# Patient Record
Sex: Male | Born: 1940 | Race: Black or African American | Hispanic: No | Smoking: Never smoker
Health system: Southern US, Community
[De-identification: ages and names within clinical notes are randomized; demographics above are authoritative.]

## PROBLEM LIST (undated history)

## (undated) DIAGNOSIS — M48061 Spinal stenosis, lumbar region without neurogenic claudication: Secondary | ICD-10-CM

## (undated) DIAGNOSIS — F419 Anxiety disorder, unspecified: Secondary | ICD-10-CM

## (undated) DIAGNOSIS — M1990 Unspecified osteoarthritis, unspecified site: Secondary | ICD-10-CM

## (undated) DIAGNOSIS — F32A Depression, unspecified: Secondary | ICD-10-CM

## (undated) DIAGNOSIS — M545 Low back pain, unspecified: Secondary | ICD-10-CM

## (undated) DIAGNOSIS — C61 Malignant neoplasm of prostate: Secondary | ICD-10-CM

## (undated) DIAGNOSIS — Z87828 Personal history of other (healed) physical injury and trauma: Secondary | ICD-10-CM

## (undated) DIAGNOSIS — M5410 Radiculopathy, site unspecified: Secondary | ICD-10-CM

## (undated) DIAGNOSIS — M47896 Other spondylosis, lumbar region: Secondary | ICD-10-CM

## (undated) DIAGNOSIS — G459 Transient cerebral ischemic attack, unspecified: Secondary | ICD-10-CM

## (undated) DIAGNOSIS — R202 Paresthesia of skin: Secondary | ICD-10-CM

## (undated) HISTORY — PX: PROSTATE BIOPSY: SHX241

## (undated) HISTORY — DX: Paresthesia of skin: R20.2

## (undated) HISTORY — DX: Low back pain, unspecified: M54.50

## (undated) HISTORY — DX: Radiculopathy, site unspecified: M54.10

## (undated) HISTORY — DX: Spinal stenosis, lumbar region without neurogenic claudication: M48.061

## (undated) HISTORY — PX: TONSILLECTOMY: SUR1361

## (undated) HISTORY — DX: Other spondylosis, lumbar region: M47.896

## (undated) HISTORY — DX: Personal history of other (healed) physical injury and trauma: Z87.828

## (undated) SURGICAL SUPPLY — 1 items: HIP BALL ARTICU EZE 36 8.5 (Hips) ×1 IMPLANT

---

## 1999-12-13 ENCOUNTER — Ambulatory Visit (HOSPITAL_COMMUNITY): Admission: RE | Admit: 1999-12-13 | Discharge: 1999-12-13 | Payer: Self-pay | Admitting: Ophthalmology

## 1999-12-13 ENCOUNTER — Encounter: Payer: Self-pay | Admitting: Ophthalmology

## 2015-04-05 DIAGNOSIS — R972 Elevated prostate specific antigen [PSA]: Secondary | ICD-10-CM | POA: Diagnosis not present

## 2015-04-05 DIAGNOSIS — Z0000 Encounter for general adult medical examination without abnormal findings: Secondary | ICD-10-CM | POA: Diagnosis not present

## 2015-08-10 DIAGNOSIS — E785 Hyperlipidemia, unspecified: Secondary | ICD-10-CM | POA: Diagnosis not present

## 2015-08-15 DIAGNOSIS — M545 Low back pain: Secondary | ICD-10-CM | POA: Diagnosis not present

## 2015-08-15 DIAGNOSIS — Z0000 Encounter for general adult medical examination without abnormal findings: Secondary | ICD-10-CM | POA: Diagnosis not present

## 2015-08-15 DIAGNOSIS — G8929 Other chronic pain: Secondary | ICD-10-CM | POA: Diagnosis not present

## 2015-08-15 DIAGNOSIS — M791 Myalgia: Secondary | ICD-10-CM | POA: Diagnosis not present

## 2015-08-21 DIAGNOSIS — M9903 Segmental and somatic dysfunction of lumbar region: Secondary | ICD-10-CM | POA: Diagnosis not present

## 2015-08-21 DIAGNOSIS — M9905 Segmental and somatic dysfunction of pelvic region: Secondary | ICD-10-CM | POA: Diagnosis not present

## 2015-08-21 DIAGNOSIS — M5136 Other intervertebral disc degeneration, lumbar region: Secondary | ICD-10-CM | POA: Diagnosis not present

## 2015-08-21 DIAGNOSIS — M9904 Segmental and somatic dysfunction of sacral region: Secondary | ICD-10-CM | POA: Diagnosis not present

## 2015-10-02 DIAGNOSIS — R972 Elevated prostate specific antigen [PSA]: Secondary | ICD-10-CM | POA: Diagnosis not present

## 2015-11-22 DIAGNOSIS — R972 Elevated prostate specific antigen [PSA]: Secondary | ICD-10-CM | POA: Diagnosis not present

## 2015-11-22 DIAGNOSIS — D075 Carcinoma in situ of prostate: Secondary | ICD-10-CM | POA: Diagnosis not present

## 2015-12-11 DIAGNOSIS — C61 Malignant neoplasm of prostate: Secondary | ICD-10-CM | POA: Diagnosis not present

## 2015-12-18 ENCOUNTER — Encounter: Payer: Self-pay | Admitting: Radiation Oncology

## 2015-12-18 ENCOUNTER — Ambulatory Visit
Admission: RE | Admit: 2015-12-18 | Discharge: 2015-12-18 | Disposition: A | Discharge status: Home / Self Care | Payer: Medicare Other | Source: Ambulatory Visit | Attending: Radiation Oncology | Admitting: Radiation Oncology

## 2015-12-18 VITALS — BP 117/74 | HR 71 | Resp 16 | Ht 71.0 in | Wt 210.0 lb

## 2015-12-18 DIAGNOSIS — C61 Malignant neoplasm of prostate: Secondary | ICD-10-CM | POA: Insufficient documentation

## 2015-12-18 DIAGNOSIS — Z510 Encounter for antineoplastic radiation therapy: Secondary | ICD-10-CM | POA: Insufficient documentation

## 2015-12-18 DIAGNOSIS — R972 Elevated prostate specific antigen [PSA]: Secondary | ICD-10-CM | POA: Diagnosis not present

## 2015-12-18 HISTORY — DX: Malignant neoplasm of prostate: C61

## 2015-12-18 NOTE — Progress Notes (Signed)
Radiation Oncology         [phone]) [phone] ________________________________  Initial Outpatient Consultation  Name: Scott Schneider MRN: [id number]  Date: 12/18/2015  DOB: 06/07/1940  CC:No primary care provider on file.  Franchot Gallo, MD   REFERRING PHYSICIAN: Franchot Gallo, MD  DIAGNOSIS: 75 y.o. gentleman with stage T1c adenocarcinoma of the prostate with a Gleason's score of 4+3 and a PSA of 7.42    ICD-9-CM ICD-10-CM   1. Malignant neoplasm of prostate ([place]) Scott Schneider is a 75 y.o. gentleman.  He was noted to have an elevated PSA of 7.42 by his primary care physician, Dr. Maudie Mercury.  Accordingly, he was referred for evaluation in urology by Dr. Diona Fanti.  The patient was treated for BPH and PSA repeated in 6 months.  The patient proceeded to transrectal ultrasound with 12 biopsies of the prostate on 11/22/2015.  The prostate volume measured 39.12 cc.  Out of 12 core biopsies,3 were positive.  The maximum Gleason score was 7 (4+3), and this was seen in right mid lateral.  The patient reviewed the biopsy results with his urologist and he has kindly been referred today for discussion of potential radiation treatment options.    PREVIOUS RADIATION THERAPY: No  PAST MEDICAL HISTORY:  has a past medical history of Prostate cancer ([place]).    PAST SURGICAL HISTORY: Past Surgical History:  Procedure Laterality Date  . PROSTATE BIOPSY    . TONSILLECTOMY      FAMILY HISTORY: family history includes Cancer in his maternal grandmother, mother, and son.  SOCIAL HISTORY:  reports that he has never smoked. He has never used smokeless tobacco. He reports that he does not drink alcohol or use drugs.  ALLERGIES: Review of patient's allergies indicates no known allergies.  MEDICATIONS:  Current Outpatient Prescriptions  Medication Sig Dispense Refill  . acetaminophen (TYLENOL) 325 MG tablet Take 650 mg by mouth every 6 (six) hours as needed.      . Multiple Vitamins-Minerals (MULTIVITAMIN ADULT PO) Take by mouth.     No current facility-administered medications for this encounter.     REVIEW OF SYSTEMS:  A 15 point review of systems is documented in the electronic medical record. This was obtained by the nursing staff. However, I reviewed this with the patient to discuss relevant findings and make appropriate changes.  Pertinent items are noted in HPI..  The patient completed an IPSS and IIEF questionnaire.  His IPSS score was 12indicating moderate urinary outflow obstructive symptoms.  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  Patient is positive for incomplete emptying, frequency, intermittency, urgency, weak stream, nocturia x 2. He also reports chronic back pain that he manages with Tylenol and ice. Patient denies weight changes, nausea/ vomiting, dysuria, hematuria, leakage, or incontinence.   PHYSICAL EXAM: This patient is in no acute distress.  He is alert and oriented.   height is 5\' 11"  (1.803 m) and weight is 210 lb (95.3 kg). His blood pressure is 117/74 and his pulse is 71. His respiration is 16 and oxygen saturation is 100%.  He exhibits no respiratory distress or labored breathing.  He appears neurologically intact.  His mood is pleasant.  His affect is appropriate.  Please note the digital rectal exam findings described above.  KPS = 100  100 - Normal; no complaints; no evidence of disease. 90   - Able to carry  on normal activity; minor signs or symptoms of disease. 80   - Normal activity with effort; some signs or symptoms of disease. 69   - Cares for self; unable to carry on normal activity or to do active work. 60   - Requires occasional assistance, but is able to care for most of his personal needs. 50   - Requires considerable assistance and frequent medical care. 52   - Disabled; requires special care and assistance. 45   - Severely disabled; hospital  admission is indicated although death not imminent. 35   - Very sick; hospital admission necessary; active supportive treatment necessary. 10   - Moribund; fatal processes progressing rapidly. 0     - Dead  Karnofsky DA, Abelmann WH, Craver LS and Burchenal JH [phone]) The use of the nitrogen mustards in the palliative treatment of carcinoma: with particular reference to bronchogenic carcinoma Cancer 1 [phone]   LABORATORY DATA:  No results found for: WBC, HGB, HCT, MCV, PLT No results found for: NA, K, CL, CO2 No results found for: ALT, AST, GGT, ALKPHOS, BILITOT   RADIOGRAPHY: No results found.    IMPRESSION: This gentleman is a 75 yo with stage T1c adenocarcinoma of the prostate with a Gleason's score of 4+3 and a PSA of 7.42. His T-Stage, Gleason's Score, and PSA put him into the intermediate risk group.  Accordingly he is eligible for a variety of potential treatment options including prostatectomy, external radiation, radioactive seed implant, or hormone therapy.  PLAN: Today I reviewed the findings and workup thus far.  We discussed the natural history of prostate cancer.  We reviewed the the implications of T-stage, Gleason's Score, and PSA on decision-making and outcomes in prostate cancer.  We discussed radiation treatment in the management of prostate cancer with regard to the logistics and delivery of external beam radiation treatment as well as the logistics and delivery of prostate brachytherapy.  We compared and contrasted each of these approaches and also compared these against prostatectomy.  The patient expressed interest in external beam radiotherapy followed by seed implant boost.  We also discussed ADT which is an option in this setting for 6 months with radiation.  We discussed the pros and cons of ADT.  The patient would prefer to avoid ADT, which is reasonable in the setting of EBRT with seed boost given the small volume of 4+3 disease.  The patient would like to proceed  with external radiation followed by radioactive seed implant.  I will share my findings with Dr. Diona Fanti and move forward with scheduling placement of three gold fiducial markers into the prostate to proceed with treatment in the near future.     I enjoyed meeting with him today, and will look forward to participating in the care of this very nice gentleman.   I spent 60 minutes face to face with the patient and more than 50% of that time was spent in counseling and/or coordination of care.   ------------------------------------------------  Sheral Apley. Tammi Klippel, M.D.   This document serves as a record of services personally performed by [name], MD. It was created on his behalf by Bethann Humble, a trained medical scribe. The creation of this record is based on the scribe's personal observations and the provider's statements to them. This document has been checked and approved by the attending provider.

## 2015-12-18 NOTE — Progress Notes (Signed)
GU Location of Tumor / Histology: prostatic adenocarcinoma  If Prostate Cancer, Gleason Score is (4 + 3) and PSA is (7.42) in July 2017  Rondall Allegra presented to PCP for annual physical and was found to have an elevated PSA of 1 then, at recheck was 4. PCP referred patient to Dr. Diona Fanti.   Biopsies of prostate (if applicable) revealed:    Past/Anticipated interventions by urology, if any: prostate biopsy and referral to Dr. Tammi Klippel  Past/Anticipated interventions by medical oncology, if any: no  Weight changes, if any: no  Bowel/Bladder complaints, if any: IPSS 12 with incomplete emptying, frequency, intermittency, urgency, weak stream and nocturia x 2. Denies dysuria, hematuria, leakage or incontinence.   Nausea/Vomiting, if any: no  Pain issues, if any:  Reports chronic back pain which he manages with tylenol and ice  SAFETY ISSUES:  Prior radiation? no  Pacemaker/ICD? no  Possible current pregnancy? no  Is the patient on methotrexate? no  Current Complaints / other details:  75 year old male. Married. Reports occasional night sweats. Reports he has a low tolerance for pain/"sensitive nerves."

## 2015-12-18 NOTE — Progress Notes (Signed)
See progress note under physician encounter. 

## 2015-12-22 ENCOUNTER — Telehealth: Payer: Self-pay | Admitting: *Deleted

## 2015-12-22 NOTE — Telephone Encounter (Signed)
CALLED PATIENT TO INFORM OF APPT. FOR GOLD SEED PLACEMENT ON 12-28-15- ARRIVAL TIME - 8 AM @ DR. DAHLSTEDT'S OFFICE AND HIS SIM ON 01-04-16 @ 1 PM @ DR. MANNING'S OFFICE, SPOKE WITH PATIENT AND HE IS AWARE OF THESE APPTS.

## 2015-12-28 DIAGNOSIS — C61 Malignant neoplasm of prostate: Secondary | ICD-10-CM | POA: Diagnosis not present

## 2015-12-29 NOTE — Progress Notes (Signed)
  Radiation Oncology         [phone] ________________________________  Name: Scott Schneider MRN: [id number]  Date: 01/04/2016  DOB: 1940-04-08  SIMULATION AND TREATMENT PLANNING NOTE    ICD-9-CM ICD-10-CM   1. Malignant neoplasm of prostate ([place]) 185 C61     DIAGNOSIS:  75 y.o. gentleman with stage T1c adenocarcinoma of the prostate with a Gleason's score of 4+3 and a PSA of 7.42  NARRATIVE:  The patient was brought to the [facility].  Identity was confirmed.  All relevant records and images related to the planned course of therapy were reviewed.  The patient freely provided informed written consent to proceed with treatment after reviewing the details related to the planned course of therapy. The consent form was witnessed and verified by the simulation staff.  Then, the patient was set-up in a stable reproducible supine position for radiation therapy.  A vacuum lock pillow device was custom fabricated to position his legs in a reproducible immobilized position.  Then, I performed a urethrogram under sterile conditions to identify the prostatic apex.  CT images were obtained.  Surface markings were placed.  The CT images were loaded into the planning software.  Then the prostate target and avoidance structures including the rectum, bladder, bowel and hips were contoured.  Treatment planning then occurred.  The radiation prescription was entered and confirmed.  A total of 1 complex treatment device was fabricated. I have requested : Intensity Modulated Radiotherapy (IMRT) is medically necessary for this case for the following reason:  Rectal sparing.Marland Kitchen  PLAN:  The patient will receive 78 Gy in 40 fractions. ________________________________  Sheral Apley Tammi Klippel, M.D.

## 2016-01-04 ENCOUNTER — Encounter: Payer: Self-pay | Admitting: Medical Oncology

## 2016-01-04 ENCOUNTER — Ambulatory Visit
Admission: RE | Admit: 2016-01-04 | Discharge: 2016-01-04 | Disposition: A | Discharge status: Home / Self Care | Payer: Medicare Other | Source: Ambulatory Visit | Attending: Radiation Oncology | Admitting: Radiation Oncology

## 2016-01-04 DIAGNOSIS — C61 Malignant neoplasm of prostate: Secondary | ICD-10-CM

## 2016-01-04 DIAGNOSIS — Z510 Encounter for antineoplastic radiation therapy: Secondary | ICD-10-CM | POA: Diagnosis not present

## 2016-01-22 DIAGNOSIS — C61 Malignant neoplasm of prostate: Secondary | ICD-10-CM | POA: Diagnosis not present

## 2016-01-22 DIAGNOSIS — Z510 Encounter for antineoplastic radiation therapy: Secondary | ICD-10-CM | POA: Diagnosis not present

## 2016-01-25 ENCOUNTER — Inpatient Hospital Stay: Admission: RE | Admit: 2016-01-25 | Payer: Medicare Other | Source: Ambulatory Visit | Admitting: Radiation Oncology

## 2016-01-25 ENCOUNTER — Ambulatory Visit
Admission: RE | Admit: 2016-01-25 | Discharge: 2016-01-25 | Disposition: A | Discharge status: Home / Self Care | Payer: Medicare Other | Source: Ambulatory Visit | Attending: Radiation Oncology | Admitting: Radiation Oncology

## 2016-01-25 DIAGNOSIS — C61 Malignant neoplasm of prostate: Secondary | ICD-10-CM | POA: Diagnosis not present

## 2016-01-25 DIAGNOSIS — Z510 Encounter for antineoplastic radiation therapy: Secondary | ICD-10-CM | POA: Diagnosis not present

## 2016-01-26 ENCOUNTER — Ambulatory Visit: Payer: Medicare Other | Admitting: Radiation Oncology

## 2016-01-26 ENCOUNTER — Ambulatory Visit
Admission: RE | Admit: 2016-01-26 | Discharge: 2016-01-26 | Disposition: A | Discharge status: Home / Self Care | Payer: Medicare Other | Source: Ambulatory Visit | Attending: Radiation Oncology | Admitting: Radiation Oncology

## 2016-01-26 DIAGNOSIS — C61 Malignant neoplasm of prostate: Secondary | ICD-10-CM | POA: Diagnosis not present

## 2016-01-26 DIAGNOSIS — Z510 Encounter for antineoplastic radiation therapy: Secondary | ICD-10-CM | POA: Diagnosis not present

## 2016-01-28 ENCOUNTER — Encounter: Payer: Self-pay | Admitting: Radiation Oncology

## 2016-01-28 ENCOUNTER — Ambulatory Visit
Admission: RE | Admit: 2016-01-28 | Discharge: 2016-01-28 | Disposition: A | Discharge status: Home / Self Care | Payer: Medicare Other | Source: Ambulatory Visit | Attending: Radiation Oncology | Admitting: Radiation Oncology

## 2016-01-28 ENCOUNTER — Inpatient Hospital Stay
Admission: RE | Admit: 2016-01-28 | Discharge: 2016-01-28 | Disposition: A | Discharge status: Home / Self Care | Payer: Medicare Other | Source: Ambulatory Visit | Attending: Radiation Oncology | Admitting: Radiation Oncology

## 2016-01-28 VITALS — BP 123/89 | HR 90 | Temp 98.1°F | Resp 16 | Wt 208.4 lb

## 2016-01-28 DIAGNOSIS — C61 Malignant neoplasm of prostate: Secondary | ICD-10-CM | POA: Diagnosis not present

## 2016-01-28 DIAGNOSIS — Z510 Encounter for antineoplastic radiation therapy: Secondary | ICD-10-CM | POA: Diagnosis not present

## 2016-01-28 NOTE — Progress Notes (Signed)
  Radiation Oncology         [phone]   Name: Scott Schneider MRN: [id number]   Date: 01/28/2016  DOB: 12/15/1940   Weekly Radiation Therapy Management    ICD-9-CM ICD-10-CM   1. Malignant neoplasm of prostate ([facility]) 185 C61     Current Dose: 5.85 Gy  Planned Dose:  78 Gy  Narrative The patient presents for routine under treatment assessment.  Weekly rad tx  prostate 3/40 completed. The patient reports a good stream, denies hematuria or dysuria, and pain. He reports nocturia x2 and regular bowels.  Set-up films were reviewed. The chart was checked.  Physical Findings  weight is 208 lb 6.4 oz (94.5 kg). His oral temperature is 98.1 F (36.7 C). His blood pressure is 123/89 and his pulse is 90. His respiration is 16. . Weight essentially stable.  No significant changes.  Impression The patient is tolerating radiation.  Plan Continue treatment as planned.         Sheral Apley Tammi Klippel, M.D.  This document serves as a record of services personally performed by Scott Pita, MD. It was created on his behalf by Darcus Austin, a trained medical scribe. The creation of this record is based on the scribe's personal observations and the provider's statements to them. This document has been checked and approved by the attending provider.

## 2016-01-28 NOTE — Progress Notes (Addendum)
Weekly rad tx  prostate 3/40 completed, patient education done,  Radiation therapy and you book, Sam RN business card,  Discussed sie effects, ways to manage them, :fatigue, skin irritation, dysuria  Urgency frequency, hematuria,  Nocturia, come with full bladder,   No skin products on area being treated,  No issues today, good stream, no hematuria or dysuria, nocturia x2, regular bowels, no pain, verbal understanding ,teach back given BP 123/89 (BP Location: Left Arm, Patient Position: Sitting, Cuff Size: Normal)   Pulse 90   Temp 98.1 F (36.7 C) (Oral)   Resp 16   Wt 208 lb 6.4 oz (94.5 kg)   BMI 29.07 kg/m   Wt Readings from Last 3 Encounters:  01/28/16 208 lb 6.4 oz (94.5 kg)  12/18/15 210 lb (95.3 kg)

## 2016-01-29 ENCOUNTER — Ambulatory Visit
Admission: RE | Admit: 2016-01-29 | Discharge: 2016-01-29 | Disposition: A | Discharge status: Home / Self Care | Payer: Medicare Other | Source: Ambulatory Visit | Attending: Radiation Oncology | Admitting: Radiation Oncology

## 2016-01-29 DIAGNOSIS — C61 Malignant neoplasm of prostate: Secondary | ICD-10-CM | POA: Diagnosis not present

## 2016-01-29 DIAGNOSIS — Z510 Encounter for antineoplastic radiation therapy: Secondary | ICD-10-CM | POA: Diagnosis not present

## 2016-01-30 ENCOUNTER — Ambulatory Visit
Admission: RE | Admit: 2016-01-30 | Discharge: 2016-01-30 | Disposition: A | Discharge status: Home / Self Care | Payer: Medicare Other | Source: Ambulatory Visit | Attending: Radiation Oncology | Admitting: Radiation Oncology

## 2016-01-30 DIAGNOSIS — C61 Malignant neoplasm of prostate: Secondary | ICD-10-CM | POA: Diagnosis not present

## 2016-01-30 DIAGNOSIS — Z510 Encounter for antineoplastic radiation therapy: Secondary | ICD-10-CM | POA: Diagnosis not present

## 2016-01-31 ENCOUNTER — Ambulatory Visit
Admission: RE | Admit: 2016-01-31 | Discharge: 2016-01-31 | Disposition: A | Discharge status: Home / Self Care | Payer: Medicare Other | Source: Ambulatory Visit | Attending: Radiation Oncology | Admitting: Radiation Oncology

## 2016-01-31 DIAGNOSIS — C61 Malignant neoplasm of prostate: Secondary | ICD-10-CM | POA: Diagnosis not present

## 2016-01-31 DIAGNOSIS — Z510 Encounter for antineoplastic radiation therapy: Secondary | ICD-10-CM | POA: Diagnosis not present

## 2016-02-02 ENCOUNTER — Ambulatory Visit: Payer: Medicare Other

## 2016-02-05 ENCOUNTER — Ambulatory Visit
Admission: RE | Admit: 2016-02-05 | Discharge: 2016-02-05 | Disposition: A | Discharge status: Home / Self Care | Payer: Medicare Other | Source: Ambulatory Visit | Attending: Radiation Oncology | Admitting: Radiation Oncology

## 2016-02-05 DIAGNOSIS — C61 Malignant neoplasm of prostate: Secondary | ICD-10-CM | POA: Diagnosis not present

## 2016-02-05 DIAGNOSIS — Z510 Encounter for antineoplastic radiation therapy: Secondary | ICD-10-CM | POA: Diagnosis not present

## 2016-02-06 ENCOUNTER — Ambulatory Visit
Admission: RE | Admit: 2016-02-06 | Discharge: 2016-02-06 | Disposition: A | Discharge status: Home / Self Care | Payer: Medicare Other | Source: Ambulatory Visit | Attending: Radiation Oncology | Admitting: Radiation Oncology

## 2016-02-06 DIAGNOSIS — C61 Malignant neoplasm of prostate: Secondary | ICD-10-CM | POA: Diagnosis not present

## 2016-02-06 DIAGNOSIS — Z510 Encounter for antineoplastic radiation therapy: Secondary | ICD-10-CM | POA: Diagnosis not present

## 2016-02-07 ENCOUNTER — Ambulatory Visit
Admission: RE | Admit: 2016-02-07 | Discharge: 2016-02-07 | Disposition: A | Discharge status: Home / Self Care | Payer: Medicare Other | Source: Ambulatory Visit | Attending: Radiation Oncology | Admitting: Radiation Oncology

## 2016-02-07 DIAGNOSIS — C61 Malignant neoplasm of prostate: Secondary | ICD-10-CM | POA: Diagnosis not present

## 2016-02-07 DIAGNOSIS — Z510 Encounter for antineoplastic radiation therapy: Secondary | ICD-10-CM | POA: Diagnosis not present

## 2016-02-08 ENCOUNTER — Ambulatory Visit
Admission: RE | Admit: 2016-02-08 | Discharge: 2016-02-08 | Disposition: A | Discharge status: Home / Self Care | Payer: Medicare Other | Source: Ambulatory Visit | Attending: Radiation Oncology | Admitting: Radiation Oncology

## 2016-02-08 VITALS — BP 124/79 | HR 77 | Resp 16 | Wt 208.2 lb

## 2016-02-08 DIAGNOSIS — C61 Malignant neoplasm of prostate: Secondary | ICD-10-CM | POA: Diagnosis not present

## 2016-02-08 DIAGNOSIS — Z510 Encounter for antineoplastic radiation therapy: Secondary | ICD-10-CM | POA: Diagnosis not present

## 2016-02-08 NOTE — Progress Notes (Signed)
Weight and vitals stable. Denies pain. Reports nocturia x 2. Denies dysuria or hematuria. Describes a strong steady urine stream without difficulty emptying his bladder. Denies leakage or incontinence. Reports the consistency of his stool is softer but, denies diarrhea. Denies fatigue. Reports increased thirst.  BP 124/79 (BP Location: Left Arm, Patient Position: Sitting, Cuff Size: Normal)   Pulse 77   Resp 16   Wt 208 lb 3.2 oz (94.4 kg)   SpO2 100%   BMI 29.04 kg/m  Wt Readings from Last 3 Encounters:  02/08/16 208 lb 3.2 oz (94.4 kg)  01/28/16 208 lb 6.4 oz (94.5 kg)  12/18/15 210 lb (95.3 kg)

## 2016-02-08 NOTE — Progress Notes (Signed)
  Radiation Oncology         [phone]   Name: Scott Schneider MRN: [id number]   Date: 02/08/2016  DOB: 18-Jun-1940   Weekly Radiation Therapy Management    ICD-9-CM ICD-10-CM   1. Malignant neoplasm of prostate ([facility]) 185 C61     Current Dose: 19.5 Gy  Planned Dose:  78 Gy  Narrative The patient presents for routine under treatment assessment.  Weight and vitals stable. Denies pain. Reports nocturia x2. Denies dysuria or hematuria. Describes a strong steady urine stream without difficulty emptying his bladder. Denies leakage or incontinence. Reports the consistency of his stool is softer but, denies diarrhea. Denies fatigue. Reports increased thirst.   Set-up films were reviewed. The chart was checked.  Physical Findings  weight is 208 lb 3.2 oz (94.4 kg). His blood pressure is 124/79 and his pulse is 77. His respiration is 16 and oxygen saturation is 100%. . Weight essentially stable.  No significant changes. Alert, in no acute distress.  Impression The patient is tolerating radiation.  Plan Continue treatment as planned.         Sheral Apley Tammi Klippel, M.D.  This document serves as a record of services personally performed by [name], MD. It was created on his behalf by Arlyce Harman, a trained medical scribe. The creation of this record is based on the scribe's personal observations and the provider's statements to them. This document has been checked and approved by the attending provider.

## 2016-02-09 ENCOUNTER — Ambulatory Visit
Admission: RE | Admit: 2016-02-09 | Discharge: 2016-02-09 | Disposition: A | Discharge status: Home / Self Care | Payer: Medicare Other | Source: Ambulatory Visit | Attending: Radiation Oncology | Admitting: Radiation Oncology

## 2016-02-09 DIAGNOSIS — Z510 Encounter for antineoplastic radiation therapy: Secondary | ICD-10-CM | POA: Diagnosis not present

## 2016-02-09 DIAGNOSIS — C61 Malignant neoplasm of prostate: Secondary | ICD-10-CM | POA: Diagnosis not present

## 2016-02-12 ENCOUNTER — Ambulatory Visit
Admission: RE | Admit: 2016-02-12 | Discharge: 2016-02-12 | Disposition: A | Discharge status: Home / Self Care | Payer: Medicare Other | Source: Ambulatory Visit | Attending: Radiation Oncology | Admitting: Radiation Oncology

## 2016-02-12 DIAGNOSIS — Z0000 Encounter for general adult medical examination without abnormal findings: Secondary | ICD-10-CM | POA: Diagnosis not present

## 2016-02-12 DIAGNOSIS — C61 Malignant neoplasm of prostate: Secondary | ICD-10-CM | POA: Diagnosis not present

## 2016-02-12 DIAGNOSIS — N390 Urinary tract infection, site not specified: Secondary | ICD-10-CM | POA: Diagnosis not present

## 2016-02-12 DIAGNOSIS — Z510 Encounter for antineoplastic radiation therapy: Secondary | ICD-10-CM | POA: Diagnosis not present

## 2016-02-12 DIAGNOSIS — R972 Elevated prostate specific antigen [PSA]: Secondary | ICD-10-CM | POA: Diagnosis not present

## 2016-02-12 DIAGNOSIS — E7800 Pure hypercholesterolemia, unspecified: Secondary | ICD-10-CM | POA: Diagnosis not present

## 2016-02-13 ENCOUNTER — Ambulatory Visit
Admission: RE | Admit: 2016-02-13 | Discharge: 2016-02-13 | Disposition: A | Discharge status: Home / Self Care | Payer: Medicare Other | Source: Ambulatory Visit | Attending: Radiation Oncology | Admitting: Radiation Oncology

## 2016-02-13 DIAGNOSIS — Z510 Encounter for antineoplastic radiation therapy: Secondary | ICD-10-CM | POA: Diagnosis not present

## 2016-02-13 DIAGNOSIS — C61 Malignant neoplasm of prostate: Secondary | ICD-10-CM | POA: Diagnosis not present

## 2016-02-14 ENCOUNTER — Ambulatory Visit
Admission: RE | Admit: 2016-02-14 | Discharge: 2016-02-14 | Disposition: A | Discharge status: Home / Self Care | Payer: Medicare Other | Source: Ambulatory Visit | Attending: Radiation Oncology | Admitting: Radiation Oncology

## 2016-02-14 DIAGNOSIS — C61 Malignant neoplasm of prostate: Secondary | ICD-10-CM | POA: Diagnosis not present

## 2016-02-14 DIAGNOSIS — Z510 Encounter for antineoplastic radiation therapy: Secondary | ICD-10-CM | POA: Diagnosis not present

## 2016-02-15 ENCOUNTER — Ambulatory Visit
Admission: RE | Admit: 2016-02-15 | Discharge: 2016-02-15 | Disposition: A | Discharge status: Home / Self Care | Payer: Medicare Other | Source: Ambulatory Visit | Attending: Radiation Oncology | Admitting: Radiation Oncology

## 2016-02-15 VITALS — BP 143/88 | HR 50 | Resp 16 | Wt 213.4 lb

## 2016-02-15 DIAGNOSIS — Z510 Encounter for antineoplastic radiation therapy: Secondary | ICD-10-CM | POA: Diagnosis not present

## 2016-02-15 DIAGNOSIS — C61 Malignant neoplasm of prostate: Secondary | ICD-10-CM | POA: Diagnosis not present

## 2016-02-15 DIAGNOSIS — R972 Elevated prostate specific antigen [PSA]: Secondary | ICD-10-CM | POA: Diagnosis not present

## 2016-02-15 DIAGNOSIS — Z0000 Encounter for general adult medical examination without abnormal findings: Secondary | ICD-10-CM | POA: Diagnosis not present

## 2016-02-15 NOTE — Progress Notes (Signed)
Weight and vitals stable. Denies pain. Reports nocturia x 2. Denies dysuria or hematuria. Describes a strong steady stream without difficulty emptying his bladder. Denies leakage or incontinence. Reports consistency of stool remains soft without diarrhea. Reports increased fatigue. Reports increased fluid intake to quince his thirst is still required. Patient scheduled for yearly appointment with Dr. Maudie Mercury.   BP (!) 143/88   Pulse (!) 50   Resp 16   Wt 213 lb 6.4 oz (96.8 kg)   SpO2 100%   BMI 29.76 kg/m  Wt Readings from Last 3 Encounters:  02/15/16 213 lb 6.4 oz (96.8 kg)  02/08/16 208 lb 3.2 oz (94.4 kg)  01/28/16 208 lb 6.4 oz (94.5 kg)

## 2016-02-15 NOTE — Progress Notes (Signed)
  Radiation Oncology         [phone]   Name: Scott Schneider MRN: [id number]   Date: 02/15/2016  DOB: 21-Feb-1941   Weekly Radiation Therapy Management    ICD-9-CM ICD-10-CM   1. Malignant neoplasm of prostate ([facility]) 185 C61     Current Dose: 29.25 Gy  Planned Dose:  78 Gy  Narrative The patient presents for routine under treatment assessment.  Weight and vitals stable. Denies pain. Reports nocturia x 2. Denies dysuria or hematuria. Describes a strong steady stream without difficulty emptying his bladder. Denies leakage or incontinence. Reports consistency of stool remains soft without diarrhea. Reports increased fatigue. Reports increased fluid intake to quince his thirst is still required. Patient scheduled for yearly appointment with Dr. Maudie Mercury.  Set-up films were reviewed. The chart was checked.  Physical Findings  weight is 213 lb 6.4 oz (96.8 kg). His blood pressure is 143/88 (abnormal) and his pulse is 50 (abnormal). His respiration is 16 and oxygen saturation is 100%. . Weight essentially stable.  No significant changes. Alert, in no acute distress.  Impression The patient is tolerating radiation.  Plan Continue treatment as planned.         Sheral Apley Tammi Klippel, M.D.  This document serves as a record of services personally performed by [name], MD. It was created on his behalf by Arlyce Harman, a trained medical scribe. The creation of this record is based on the scribe's personal observations and the provider's statements to them. This document has been checked and approved by the attending provider.

## 2016-02-16 ENCOUNTER — Ambulatory Visit
Admission: RE | Admit: 2016-02-16 | Discharge: 2016-02-16 | Disposition: A | Discharge status: Home / Self Care | Payer: Medicare Other | Source: Ambulatory Visit | Attending: Radiation Oncology | Admitting: Radiation Oncology

## 2016-02-16 DIAGNOSIS — Z510 Encounter for antineoplastic radiation therapy: Secondary | ICD-10-CM | POA: Diagnosis not present

## 2016-02-16 DIAGNOSIS — C61 Malignant neoplasm of prostate: Secondary | ICD-10-CM | POA: Diagnosis not present

## 2016-02-19 ENCOUNTER — Ambulatory Visit
Admission: RE | Admit: 2016-02-19 | Discharge: 2016-02-19 | Disposition: A | Discharge status: Home / Self Care | Payer: Medicare Other | Source: Ambulatory Visit | Attending: Radiation Oncology | Admitting: Radiation Oncology

## 2016-02-19 DIAGNOSIS — C61 Malignant neoplasm of prostate: Secondary | ICD-10-CM | POA: Diagnosis not present

## 2016-02-19 DIAGNOSIS — Z510 Encounter for antineoplastic radiation therapy: Secondary | ICD-10-CM | POA: Diagnosis not present

## 2016-02-20 ENCOUNTER — Encounter: Payer: Self-pay | Admitting: Medical Oncology

## 2016-02-20 ENCOUNTER — Ambulatory Visit
Admission: RE | Admit: 2016-02-20 | Discharge: 2016-02-20 | Disposition: A | Discharge status: Home / Self Care | Payer: Medicare Other | Source: Ambulatory Visit | Attending: Radiation Oncology | Admitting: Radiation Oncology

## 2016-02-20 DIAGNOSIS — Z510 Encounter for antineoplastic radiation therapy: Secondary | ICD-10-CM | POA: Diagnosis not present

## 2016-02-20 DIAGNOSIS — C61 Malignant neoplasm of prostate: Secondary | ICD-10-CM | POA: Diagnosis not present

## 2016-02-21 ENCOUNTER — Ambulatory Visit
Admission: RE | Admit: 2016-02-21 | Discharge: 2016-02-21 | Disposition: A | Discharge status: Home / Self Care | Payer: Medicare Other | Source: Ambulatory Visit | Attending: Radiation Oncology | Admitting: Radiation Oncology

## 2016-02-21 DIAGNOSIS — C61 Malignant neoplasm of prostate: Secondary | ICD-10-CM | POA: Diagnosis not present

## 2016-02-21 DIAGNOSIS — Z510 Encounter for antineoplastic radiation therapy: Secondary | ICD-10-CM | POA: Diagnosis not present

## 2016-02-22 ENCOUNTER — Ambulatory Visit
Admission: RE | Admit: 2016-02-22 | Discharge: 2016-02-22 | Disposition: A | Discharge status: Home / Self Care | Payer: Medicare Other | Source: Ambulatory Visit | Attending: Radiation Oncology | Admitting: Radiation Oncology

## 2016-02-22 ENCOUNTER — Encounter: Payer: Self-pay | Admitting: Radiation Oncology

## 2016-02-22 VITALS — BP 127/65 | HR 89 | Temp 98.0°F | Ht 71.0 in | Wt 206.4 lb

## 2016-02-22 DIAGNOSIS — Z510 Encounter for antineoplastic radiation therapy: Secondary | ICD-10-CM | POA: Diagnosis not present

## 2016-02-22 DIAGNOSIS — C61 Malignant neoplasm of prostate: Secondary | ICD-10-CM | POA: Diagnosis not present

## 2016-02-22 NOTE — Progress Notes (Signed)
   Department of Radiation Oncology  Phone:  [phone] Fax:        [phone]  Weekly Treatment Note    Name: Scott Schneider Date: 02/22/2016 MRN: [id number] DOB: [birth year]   Diagnosis:     ICD-9-CM ICD-10-CM   1. Malignant neoplasm of prostate ([place]) 185 C61      Current dose: 39 Gy  Current fraction: 20   MEDICATIONS: Current Outpatient Prescriptions  Medication Sig Dispense Refill  . acetaminophen (TYLENOL) 325 MG tablet Take 650 mg by mouth every 6 (six) hours as needed.    . Multiple Vitamins-Minerals (MULTIVITAMIN ADULT PO) Take by mouth.     No current facility-administered medications for this encounter.      ALLERGIES: Patient has no known allergies.   LABORATORY DATA:  No results found for: WBC, HGB, HCT, MCV, PLT No results found for: NA, K, CL, CO2 No results found for: ALT, AST, GGT, ALKPHOS, BILITOT   NARRATIVE: Scott Schneider was seen today for weekly treatment management. The chart was checked and the patient's films were reviewed.  Scott Schneider presents for his 20th fraction of radiation to his Prostate. He denies pain. He reports his fatigue has improved slightly since last week. He denies urinary frequency during the day, and reports voiding two times during the nighttime. He denies burning or hematuria with urination. He reports normal bowel movements. He denies any other concerns at this time.  BP 127/65   Pulse 89   Temp 98 F (36.7 C)   Ht 5\' 11"  (1.803 m)   Wt 206 lb 6.4 oz (93.6 kg)   SpO2 99% Comment: room air  BMI 28.79 kg/m    Wt Readings from Last 3 Encounters:  02/22/16 206 lb 6.4 oz (93.6 kg)  02/15/16 213 lb 6.4 oz (96.8 kg)  02/08/16 208 lb 3.2 oz (94.4 kg)    PHYSICAL EXAMINATION: height is 5\' 11"  (1.803 m) and weight is 206 lb 6.4 oz (93.6 kg). His temperature is 98 F (36.7 C). His blood pressure is 127/65 and his pulse is 89. His oxygen saturation is 99%.        ASSESSMENT: The patient is doing satisfactorily with  treatment.  PLAN: We will continue with the patient's radiation treatment as planned.

## 2016-02-22 NOTE — Progress Notes (Signed)
Scott Schneider presents for his 20th fraction of radiation to his Prostate. He denies pain. He reports his fatigue has improved slightly since last week. He denies urinary frequency during the day, and reports voiding two times during the nighttime. He denies burning or hematuria with urination. He reports normal bowel movements. He denies any other concerns at this time.  BP 127/65   Pulse 89   Temp 98 F (36.7 C)   Ht 5\' 11"  (1.803 m)   Wt 206 lb 6.4 oz (93.6 kg)   SpO2 99% Comment: room air  BMI 28.79 kg/m    Wt Readings from Last 3 Encounters:  02/22/16 206 lb 6.4 oz (93.6 kg)  02/15/16 213 lb 6.4 oz (96.8 kg)  02/08/16 208 lb 3.2 oz (94.4 kg)

## 2016-02-23 ENCOUNTER — Ambulatory Visit
Admission: RE | Admit: 2016-02-23 | Discharge: 2016-02-23 | Disposition: A | Discharge status: Home / Self Care | Payer: Medicare Other | Source: Ambulatory Visit | Attending: Radiation Oncology | Admitting: Radiation Oncology

## 2016-02-23 DIAGNOSIS — Z510 Encounter for antineoplastic radiation therapy: Secondary | ICD-10-CM | POA: Diagnosis not present

## 2016-02-23 DIAGNOSIS — C61 Malignant neoplasm of prostate: Secondary | ICD-10-CM | POA: Diagnosis not present

## 2016-02-26 ENCOUNTER — Ambulatory Visit
Admission: RE | Admit: 2016-02-26 | Discharge: 2016-02-26 | Disposition: A | Discharge status: Home / Self Care | Payer: Medicare Other | Source: Ambulatory Visit | Attending: Radiation Oncology | Admitting: Radiation Oncology

## 2016-02-26 DIAGNOSIS — C61 Malignant neoplasm of prostate: Secondary | ICD-10-CM | POA: Diagnosis not present

## 2016-02-26 DIAGNOSIS — Z510 Encounter for antineoplastic radiation therapy: Secondary | ICD-10-CM | POA: Diagnosis not present

## 2016-02-27 ENCOUNTER — Ambulatory Visit
Admission: RE | Admit: 2016-02-27 | Discharge: 2016-02-27 | Disposition: A | Discharge status: Home / Self Care | Payer: Medicare Other | Source: Ambulatory Visit | Attending: Radiation Oncology | Admitting: Radiation Oncology

## 2016-02-27 DIAGNOSIS — C61 Malignant neoplasm of prostate: Secondary | ICD-10-CM | POA: Diagnosis not present

## 2016-02-27 DIAGNOSIS — Z510 Encounter for antineoplastic radiation therapy: Secondary | ICD-10-CM | POA: Diagnosis not present

## 2016-02-28 ENCOUNTER — Ambulatory Visit
Admission: RE | Admit: 2016-02-28 | Discharge: 2016-02-28 | Disposition: A | Discharge status: Home / Self Care | Payer: Medicare Other | Source: Ambulatory Visit | Attending: Radiation Oncology | Admitting: Radiation Oncology

## 2016-02-28 DIAGNOSIS — C61 Malignant neoplasm of prostate: Secondary | ICD-10-CM | POA: Diagnosis not present

## 2016-02-28 DIAGNOSIS — Z510 Encounter for antineoplastic radiation therapy: Secondary | ICD-10-CM | POA: Diagnosis not present

## 2016-02-29 ENCOUNTER — Ambulatory Visit
Admission: RE | Admit: 2016-02-29 | Discharge: 2016-02-29 | Disposition: A | Discharge status: Home / Self Care | Payer: Medicare Other | Source: Ambulatory Visit | Attending: Radiation Oncology | Admitting: Radiation Oncology

## 2016-02-29 DIAGNOSIS — Z510 Encounter for antineoplastic radiation therapy: Secondary | ICD-10-CM | POA: Diagnosis not present

## 2016-02-29 DIAGNOSIS — C61 Malignant neoplasm of prostate: Secondary | ICD-10-CM | POA: Diagnosis not present

## 2016-03-01 ENCOUNTER — Ambulatory Visit
Admission: RE | Admit: 2016-03-01 | Discharge: 2016-03-01 | Disposition: A | Discharge status: Home / Self Care | Payer: Medicare Other | Source: Ambulatory Visit | Attending: Radiation Oncology | Admitting: Radiation Oncology

## 2016-03-01 ENCOUNTER — Encounter: Payer: Self-pay | Admitting: Radiation Oncology

## 2016-03-01 VITALS — BP 111/78 | HR 79 | Resp 18 | Wt 210.0 lb

## 2016-03-01 DIAGNOSIS — Z510 Encounter for antineoplastic radiation therapy: Secondary | ICD-10-CM | POA: Diagnosis not present

## 2016-03-01 DIAGNOSIS — C61 Malignant neoplasm of prostate: Secondary | ICD-10-CM | POA: Diagnosis not present

## 2016-03-01 NOTE — Progress Notes (Signed)
  Radiation Oncology         [phone]   Name: Scott Schneider MRN: [id number]   Date: 03/01/2016  DOB: 09/13/1940   Weekly Radiation Therapy Management    ICD-9-CM ICD-10-CM   1. Malignant neoplasm of prostate ([facility]) 185 C61     Current Dose: 48.75 Gy  Planned Dose:  78 Gy  Narrative The patient presents for routine under treatment assessment.  Weight and vitals stable. The patient denies pain. He reports urinary frequency during the day. He denies urinary urgency, leakage, or incontinence. He denies dysuria or hematuria. The patient reports nocturia x 1. Denies diarrhea. He reports moderate fatigue.  Set-up films were reviewed. The chart was checked.  Physical Findings  weight is 210 lb (95.3 kg). His blood pressure is 111/78 and his pulse is 79. His respiration is 18 and oxygen saturation is 100%.  Weight essentially stable.  No significant changes. Alert, in no acute distress.  Impression The patient is tolerating radiation.  Plan Continue treatment as planned.         Sheral Apley Tammi Klippel, M.D.  This document serves as a record of services personally performed by [name], MD and Shona Simpson, PA. It was created on his behalf by Maryla Morrow, a trained medical scribe. The creation of this record is based on the scribe's personal observations and the provider's statements to them. This document has been checked and approved by the attending provider.

## 2016-03-01 NOTE — Progress Notes (Signed)
Weight and vitals stable. Denies pain. Reports urinary frequency during the day. Denies urinary urgency, leakage or incontinence. Denies dysuria or hematuria. Reports nocturia x 1. Denies diarrhea. Reports moderate fatigue.   BP 111/78 (BP Location: Left Arm, Patient Position: Sitting, Cuff Size: Normal)   Pulse 79   Resp 18   Wt 210 lb (95.3 kg)   SpO2 100%   BMI 29.29 kg/m  Wt Readings from Last 3 Encounters:  03/01/16 210 lb (95.3 kg)  02/22/16 206 lb 6.4 oz (93.6 kg)  02/15/16 213 lb 6.4 oz (96.8 kg)

## 2016-03-05 ENCOUNTER — Ambulatory Visit
Admission: RE | Admit: 2016-03-05 | Discharge: 2016-03-05 | Disposition: A | Discharge status: Home / Self Care | Payer: Medicare Other | Source: Ambulatory Visit | Attending: Radiation Oncology | Admitting: Radiation Oncology

## 2016-03-05 DIAGNOSIS — C61 Malignant neoplasm of prostate: Secondary | ICD-10-CM | POA: Diagnosis not present

## 2016-03-05 DIAGNOSIS — Z510 Encounter for antineoplastic radiation therapy: Secondary | ICD-10-CM | POA: Diagnosis not present

## 2016-03-06 ENCOUNTER — Ambulatory Visit
Admission: RE | Admit: 2016-03-06 | Discharge: 2016-03-06 | Disposition: A | Discharge status: Home / Self Care | Payer: Medicare Other | Source: Ambulatory Visit | Attending: Radiation Oncology | Admitting: Radiation Oncology

## 2016-03-06 DIAGNOSIS — C61 Malignant neoplasm of prostate: Secondary | ICD-10-CM | POA: Diagnosis not present

## 2016-03-06 DIAGNOSIS — Z510 Encounter for antineoplastic radiation therapy: Secondary | ICD-10-CM | POA: Diagnosis not present

## 2016-03-07 ENCOUNTER — Ambulatory Visit
Admission: RE | Admit: 2016-03-07 | Discharge: 2016-03-07 | Disposition: A | Discharge status: Home / Self Care | Payer: Medicare Other | Source: Ambulatory Visit | Attending: Radiation Oncology | Admitting: Radiation Oncology

## 2016-03-07 DIAGNOSIS — Z510 Encounter for antineoplastic radiation therapy: Secondary | ICD-10-CM | POA: Diagnosis not present

## 2016-03-07 DIAGNOSIS — C61 Malignant neoplasm of prostate: Secondary | ICD-10-CM | POA: Diagnosis not present

## 2016-03-08 ENCOUNTER — Ambulatory Visit
Admission: RE | Admit: 2016-03-08 | Discharge: 2016-03-08 | Disposition: A | Discharge status: Home / Self Care | Payer: Medicare Other | Source: Ambulatory Visit | Attending: Radiation Oncology | Admitting: Radiation Oncology

## 2016-03-08 VITALS — BP 128/78 | HR 80 | Resp 18 | Wt 208.6 lb

## 2016-03-08 DIAGNOSIS — C61 Malignant neoplasm of prostate: Secondary | ICD-10-CM

## 2016-03-08 DIAGNOSIS — Z510 Encounter for antineoplastic radiation therapy: Secondary | ICD-10-CM

## 2016-03-08 MED ORDER — RADIAPLEXRX EX GEL
Freq: Once | CUTANEOUS | Status: AC
  Administered 2016-03-08: 16:00:00 via TOPICAL

## 2016-03-08 NOTE — Progress Notes (Signed)
Weight and vitals stable. Denies pain. Reports urinary frequency. Denies urinary urgency, incontinence or leakage. Reports new onset of mild intermittent dysuria. Denies hematuria. Reports nocturia x 1. Denies diarrhea. Reports fatigue. Reports dry itchy skin in his abdominal area. Provided patient with radiaplex and directed upon use.  BP 128/78 (BP Location: Right Arm, Patient Position: Sitting, Cuff Size: Normal)   Pulse 80   Resp 18   Wt 208 lb 9.6 oz (94.6 kg)   SpO2 98%   BMI 29.09 kg/m  Wt Readings from Last 3 Encounters:  03/08/16 208 lb 9.6 oz (94.6 kg)  03/01/16 210 lb (95.3 kg)  02/22/16 206 lb 6.4 oz (93.6 kg)

## 2016-03-08 NOTE — Progress Notes (Signed)
  Radiation Oncology         [phone]   Name: Scott Schneider MRN: [id number]   Date: 03/08/2016  DOB: 1940-10-08   Weekly Radiation Therapy Management    ICD-9-CM ICD-10-CM   1. Malignant neoplasm of prostate ([facility]) 185 C61 hyaluronate sodium (RADIAPLEXRX) gel    Current Dose: 58.5 Gy  Planned Dose:  78 Gy  Narrative The patient presents for routine under treatment assessment.  Weight and vitals stable. Denies pain. Reports fatigue. He reports urinary frequency. The patient denies urinary urgency, incontinence, or leakage. He reports new onset of mild intermittent dysuria. Denies hematuria. He reports nocturia x 1. Denies diarrhea. The patient reports dry itchy skin in his abdominal area, and was provided with Radiaplex by nursing.  Set-up films were reviewed. The chart was checked.  Physical Findings  weight is 208 lb 9.6 oz (94.6 kg). His blood pressure is 128/78 and his pulse is 80. His respiration is 18 and oxygen saturation is 98%.  Weight essentially stable.  No significant changes. Alert, in no acute distress.  Impression The patient is tolerating radiation.  Plan Continue treatment as planned.        ------------------------------------------------  Jodelle Gross, MD, PhD  This document serves as a record of services personally performed by Kyung Rudd, MD. It was created on his behalf by Maryla Morrow, a trained medical scribe. The creation of this record is based on the scribe's personal observations and the provider's statements to them. This document has been checked and approved by the attending provider.

## 2016-03-12 ENCOUNTER — Ambulatory Visit
Admission: RE | Admit: 2016-03-12 | Discharge: 2016-03-12 | Disposition: A | Discharge status: Home / Self Care | Payer: Medicare Other | Source: Ambulatory Visit | Attending: Radiation Oncology | Admitting: Radiation Oncology

## 2016-03-12 DIAGNOSIS — Z510 Encounter for antineoplastic radiation therapy: Secondary | ICD-10-CM | POA: Diagnosis not present

## 2016-03-12 DIAGNOSIS — C61 Malignant neoplasm of prostate: Secondary | ICD-10-CM | POA: Diagnosis not present

## 2016-03-13 ENCOUNTER — Ambulatory Visit
Admission: RE | Admit: 2016-03-13 | Discharge: 2016-03-13 | Disposition: A | Discharge status: Home / Self Care | Payer: Medicare Other | Source: Ambulatory Visit | Attending: Radiation Oncology | Admitting: Radiation Oncology

## 2016-03-13 DIAGNOSIS — Z510 Encounter for antineoplastic radiation therapy: Secondary | ICD-10-CM | POA: Diagnosis not present

## 2016-03-13 DIAGNOSIS — C61 Malignant neoplasm of prostate: Secondary | ICD-10-CM | POA: Diagnosis not present

## 2016-03-14 ENCOUNTER — Ambulatory Visit
Admission: RE | Admit: 2016-03-14 | Discharge: 2016-03-14 | Disposition: A | Discharge status: Home / Self Care | Payer: Medicare Other | Source: Ambulatory Visit | Attending: Radiation Oncology | Admitting: Radiation Oncology

## 2016-03-14 DIAGNOSIS — C61 Malignant neoplasm of prostate: Secondary | ICD-10-CM | POA: Diagnosis not present

## 2016-03-14 DIAGNOSIS — Z510 Encounter for antineoplastic radiation therapy: Secondary | ICD-10-CM | POA: Diagnosis not present

## 2016-03-15 ENCOUNTER — Ambulatory Visit: Admission: RE | Admit: 2016-03-15 | Payer: Medicare Other | Source: Ambulatory Visit | Admitting: Radiation Oncology

## 2016-03-15 ENCOUNTER — Ambulatory Visit
Admission: RE | Admit: 2016-03-15 | Discharge: 2016-03-15 | Disposition: A | Discharge status: Home / Self Care | Payer: Medicare Other | Source: Ambulatory Visit | Attending: Radiation Oncology | Admitting: Radiation Oncology

## 2016-03-15 DIAGNOSIS — Z510 Encounter for antineoplastic radiation therapy: Secondary | ICD-10-CM | POA: Diagnosis not present

## 2016-03-15 DIAGNOSIS — C61 Malignant neoplasm of prostate: Secondary | ICD-10-CM | POA: Diagnosis not present

## 2016-03-18 ENCOUNTER — Ambulatory Visit
Admission: RE | Admit: 2016-03-18 | Discharge: 2016-03-18 | Disposition: A | Discharge status: Home / Self Care | Payer: Medicare Other | Source: Ambulatory Visit | Attending: Radiation Oncology | Admitting: Radiation Oncology

## 2016-03-18 VITALS — BP 123/62 | HR 77 | Resp 18 | Wt 209.0 lb

## 2016-03-18 DIAGNOSIS — C61 Malignant neoplasm of prostate: Secondary | ICD-10-CM

## 2016-03-18 DIAGNOSIS — Z510 Encounter for antineoplastic radiation therapy: Secondary | ICD-10-CM | POA: Insufficient documentation

## 2016-03-18 NOTE — Progress Notes (Signed)
  Radiation Oncology         [phone]   Name: Scott Schneider MRN: [id number]   Date: 03/18/2016  DOB: 1940/04/13   Weekly Radiation Therapy Management    ICD-9-CM ICD-10-CM   1. 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  Weight and vitals stable. The patient denies pain. He reports fatigue. He reports urinary frequency continues. The patient denies any urinary incontinence or leakage. He reports increased frequency and intensity of dysuria, with new onset of urinary urgency. He denies any bowel complaints. The patient also reports that his dry, itchy abdominal skin has resolved with Radiaplex.  Set-up films were reviewed. The chart was checked.  Physical Findings  weight is 209 lb (94.8 kg). His blood pressure is 123/62 and his pulse is 77. His respiration is 18 and oxygen saturation is 100%.  Weight essentially stable.  No significant changes. Alert, in no acute distress.  Impression The patient is tolerating radiation.  Plan Continue treatment as planned.         Sheral Apley Tammi Klippel, M.D.  This document serves as a record of services personally performed by [name], MD and Shona Simpson, PA. It was created on his behalf by Maryla Morrow, a trained medical scribe. The creation of this record is based on the scribe's personal observations and the provider's statements to them. This document has been checked and approved by the attending provider.

## 2016-03-18 NOTE — Progress Notes (Signed)
Weight and vitals stable. Denies pain. Reports urinary frequency continues. Denies incontinence or leakage. Reports increased frequency and intensity of dysuria. Reports new onset urinary urgency. Denies any bowel complaints. Reports fatigue. Reports dry itchy abdominal skin has resolved with radiaplex.   BP 123/62   Pulse 77   Resp 18   Wt 209 lb (94.8 kg)   SpO2 100%   BMI 29.15 kg/m  Wt Readings from Last 3 Encounters:  03/18/16 209 lb (94.8 kg)  03/08/16 208 lb 9.6 oz (94.6 kg)  03/01/16 210 lb (95.3 kg)

## 2016-03-19 ENCOUNTER — Ambulatory Visit
Admission: RE | Admit: 2016-03-19 | Discharge: 2016-03-19 | Disposition: A | Discharge status: Home / Self Care | Payer: Medicare Other | Source: Ambulatory Visit | Attending: Radiation Oncology | Admitting: Radiation Oncology

## 2016-03-19 DIAGNOSIS — C61 Malignant neoplasm of prostate: Secondary | ICD-10-CM | POA: Diagnosis not present

## 2016-03-19 DIAGNOSIS — Z510 Encounter for antineoplastic radiation therapy: Secondary | ICD-10-CM | POA: Diagnosis not present

## 2016-03-20 ENCOUNTER — Ambulatory Visit
Admission: RE | Admit: 2016-03-20 | Discharge: 2016-03-20 | Disposition: A | Discharge status: Home / Self Care | Payer: Medicare Other | Source: Ambulatory Visit | Attending: Radiation Oncology | Admitting: Radiation Oncology

## 2016-03-20 DIAGNOSIS — C61 Malignant neoplasm of prostate: Secondary | ICD-10-CM | POA: Diagnosis not present

## 2016-03-20 DIAGNOSIS — Z510 Encounter for antineoplastic radiation therapy: Secondary | ICD-10-CM | POA: Diagnosis not present

## 2016-03-21 ENCOUNTER — Ambulatory Visit
Admission: RE | Admit: 2016-03-21 | Discharge: 2016-03-21 | Disposition: A | Discharge status: Home / Self Care | Payer: Medicare Other | Source: Ambulatory Visit | Attending: Radiation Oncology | Admitting: Radiation Oncology

## 2016-03-21 ENCOUNTER — Telehealth: Payer: Self-pay | Admitting: *Deleted

## 2016-03-21 DIAGNOSIS — Z510 Encounter for antineoplastic radiation therapy: Secondary | ICD-10-CM | POA: Diagnosis not present

## 2016-03-21 DIAGNOSIS — C61 Malignant neoplasm of prostate: Secondary | ICD-10-CM | POA: Diagnosis not present

## 2016-03-21 NOTE — Progress Notes (Addendum)
Nurse received note from Dr. Tammi Klippel questioning when patient's follow up with Dahlstedt was. Phoned Dahlstedt's office and arranged a three month follow up for 04/24/16 at [facility]. Romie Jumper will phone patient with appointment and mail him an appointment card.

## 2016-03-21 NOTE — Progress Notes (Signed)
  Radiation Oncology         [phone]   Name: Scott Schneider MRN: [id number]   Date: 03/21/2016  DOB: 12-01-1940   Weekly Radiation Therapy Management    ICD-9-CM ICD-10-CM   1. 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  Weight and vitals stable. Patient reports continued urinary issues including frequency, dysuria, and urgency.   Set-up films were reviewed. The chart was checked.  Physical Findings  Weight is 206.5.  No significant changes. Alert, in no acute distress.  Impression The patient is tolerating radiation.  Plan Continue treatment as planned. A one month follow up appointment was given.         Sheral Apley Tammi Klippel, M.D.  This document serves as a record of services personally performed by [name], MD. It was created on his behalf by Bethann Humble, a trained medical scribe. The creation of this record is based on the scribe's personal observations and the provider's statements to them. This document has been checked and approved by the attending provider.

## 2016-03-21 NOTE — Progress Notes (Signed)
Appointment card for a one month follow with Dr. Tammi Klippel given.

## 2016-03-21 NOTE — Telephone Encounter (Signed)
CALLED PATIENT TO INFORM OF FU VISIT WITH DR. DAHLSTEDT ON 05-06-16 - ARRIVAL TIME - 2:15 PM, SPOKE WITH PT. AND HE IS AWARE OF THIS APPT. AND I MAILED HIM AN APPT. CARD.

## 2016-03-22 ENCOUNTER — Ambulatory Visit
Admission: RE | Admit: 2016-03-22 | Discharge: 2016-03-22 | Disposition: A | Discharge status: Home / Self Care | Payer: Medicare Other | Source: Ambulatory Visit | Attending: Radiation Oncology | Admitting: Radiation Oncology

## 2016-03-22 DIAGNOSIS — C61 Malignant neoplasm of prostate: Secondary | ICD-10-CM | POA: Diagnosis not present

## 2016-03-22 DIAGNOSIS — Z510 Encounter for antineoplastic radiation therapy: Secondary | ICD-10-CM | POA: Diagnosis not present

## 2016-03-25 ENCOUNTER — Ambulatory Visit
Admission: RE | Admit: 2016-03-25 | Discharge: 2016-03-25 | Disposition: A | Discharge status: Home / Self Care | Payer: Medicare Other | Source: Ambulatory Visit | Attending: Radiation Oncology | Admitting: Radiation Oncology

## 2016-03-25 ENCOUNTER — Encounter: Payer: Self-pay | Admitting: Medical Oncology

## 2016-03-25 DIAGNOSIS — Z510 Encounter for antineoplastic radiation therapy: Secondary | ICD-10-CM | POA: Diagnosis not present

## 2016-03-25 DIAGNOSIS — C61 Malignant neoplasm of prostate: Secondary | ICD-10-CM | POA: Diagnosis not present

## 2016-03-28 ENCOUNTER — Encounter: Payer: Self-pay | Admitting: Radiation Oncology

## 2016-03-28 NOTE — Progress Notes (Signed)
  Radiation Oncology         [phone] ________________________________  Name: Scott Schneider MRN: [id number]  Date: 03/28/2016  DOB: 01/01/1941  End of Treatment Note  Diagnosis:  Malignant neoplasm of prostate    Indication for treatment:  Curative       Radiation treatment dates:   01/25/16-03/25/16  Site/dose:  Prostate/ 78 Gy in 40 fractions  Beams/energy:   IMRT/ 6X  Narrative: The patient tolerated radiation treatment relatively well. During treatment, the patient reports continued urinary issues including frequency, dysuria, and urgency.  Plan: The patient has completed radiation treatment. The patient will return to radiation oncology clinic for routine followup in one month. I advised him to call or return sooner if he has any questions or concerns related to his recovery or treatment. ________________________________  Sheral Apley. Tammi Klippel, M.D.   This document serves as a record of services personally performed by [name], MD. It was created on his behalf by Bethann Humble, a trained medical scribe. The creation of this record is based on the scribe's personal observations and the provider's statements to them. This document has been checked and approved by the attending provider.

## 2016-04-02 NOTE — Progress Notes (Signed)
Mr. Westmoreland has follow up with Shona Simpson, PA 05/14/16.

## 2016-05-09 NOTE — Progress Notes (Signed)
Rondall Allegra 75 y.o.an with malignant neoplasm of prostate radiation completed 03-25-16 one month FU.  Pain: He is currently not having pain.    URINARY: He denies  urinary frequency,urinary urgency and hematuria.  Reports he is  emptying  his bladder. Pt states he gets up to urinate one time per night. BOWEL: He reports a  bowel movement everyday, normal bowel movements.  Denies having diarrhea. Fatigue:Reports he is not having fatigue. Appetite:Good eating three meals per day and several snacks. Weight: Wt Readings from Last 3 Encounters:  05/14/16 206 lb 6.4 oz (93.6 kg)  03/18/16 209 lb (94.8 kg)  03/08/16 208 lb 9.6 oz (94.6 kg)   Urologist:Dr, Dahlstedt was scheduled to see Dr. Diona Fanti last week rescheduled  For 06-2016. Lupron injection every  months: Last  dose  N/A                                                             PSA:4.1 ng/ ml during the time of radiation treatment. BP 117/75   Pulse 82   Temp 97.7 F (36.5 C) (Oral)   Resp 18   Ht 5\' 11"  (1.803 m)   Wt 206 lb 6.4 oz (93.6 kg)   SpO2 100%   BMI 28.79 kg/m

## 2016-05-14 ENCOUNTER — Ambulatory Visit
Admission: RE | Admit: 2016-05-14 | Discharge: 2016-05-14 | Disposition: A | Discharge status: Home / Self Care | Payer: Medicare Other | Source: Ambulatory Visit | Attending: Radiation Oncology | Admitting: Radiation Oncology

## 2016-05-14 ENCOUNTER — Encounter: Payer: Self-pay | Admitting: Radiation Oncology

## 2016-05-14 VITALS — BP 117/75 | HR 82 | Temp 97.7°F | Resp 18 | Ht 71.0 in | Wt 206.4 lb

## 2016-05-14 DIAGNOSIS — R300 Dysuria: Secondary | ICD-10-CM | POA: Insufficient documentation

## 2016-05-14 DIAGNOSIS — C61 Malignant neoplasm of prostate: Secondary | ICD-10-CM | POA: Diagnosis present

## 2016-05-14 NOTE — Addendum Note (Signed)
Encounter addended by: Malena Edman, RN on: 05/14/2016  3:04 PM<BR>    Actions taken: Charge Capture section accepted

## 2016-05-14 NOTE — Progress Notes (Signed)
  Radiation Oncology         [phone] ________________________________  Name: Scott Schneider MRN: [id number]  Date: 05/14/2016  DOB: Dec 08, 1940  Post Treatment Note  CC: No primary care provider on file.  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  The patient reported mild urinary issues including frequency dysuria and urgency throughout his treatment.  He was scheduled for follow-up with Dr. Diona Fanti on 05/06/2016 but the office called and rescheduled this follow up for April 2018.   On review of systems, the patient states that he is doing well and specifically denies frequency urgency dysuria or hematuria. He feels that he is emptying his bladder well. He only gets up 1 time at night to urinate. As far as his bowels are concerned, he reports a normal bowel movement daily. He denies nausea vomiting or diarrhea. He reports some mild ongoing fatigue but feels that this is gradually improving. He has a good appetite and is maintaining his weight.                         ALLERGIES:  has No Known Allergies.  Meds: Current Outpatient Prescriptions  Medication Sig Dispense Refill  . acetaminophen (TYLENOL) 325 MG tablet Take 650 mg by mouth every 6 (six) hours as needed.    . Multiple Vitamins-Minerals (MULTIVITAMIN ADULT PO) Take by mouth.     No current facility-administered medications for this encounter.     Physical Findings:  height is 5\' 11"  (1.803 m) and weight is 206 lb 6.4 oz (93.6 kg). His oral temperature is 97.7 F (36.5 C). His blood pressure is 117/75 and his pulse is 82. His respiration is 18 and oxygen saturation is 100%.  Pain Assessment Pain Score: 0-No pain/10 In general this is a well appearing African-American male in no acute  distress. He's alert and oriented x4 and appropriate throughout the examination. Cardiopulmonary assessment is negative for acute distress and he exhibits normal effort.   Lab Findings: No results found for: WBC, HGB, HCT, MCV, PLT   Radiographic Findings: No results found.  Impression/Plan: 1. 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 He has recovered well from the effects of radiotherapy. Today we discussed expectations for PSA monitoring going forward. He will continue close follow-up with his urologist, Dr. Diona Fanti. He is advised that we are happy to continue to participate in his care but at this point he can follow up as needed. I encouraged him to call or contact us with any questions or concerns that he may have regarding his previous radiotherapy.   Nicholos Johns, PA-C

## 2016-06-03 DIAGNOSIS — M9903 Segmental and somatic dysfunction of lumbar region: Secondary | ICD-10-CM | POA: Diagnosis not present

## 2016-06-03 DIAGNOSIS — M5031 Other cervical disc degeneration,  high cervical region: Secondary | ICD-10-CM | POA: Diagnosis not present

## 2016-06-03 DIAGNOSIS — M5136 Other intervertebral disc degeneration, lumbar region: Secondary | ICD-10-CM | POA: Diagnosis not present

## 2016-06-03 DIAGNOSIS — M9901 Segmental and somatic dysfunction of cervical region: Secondary | ICD-10-CM | POA: Diagnosis not present

## 2016-06-05 DIAGNOSIS — M5031 Other cervical disc degeneration,  high cervical region: Secondary | ICD-10-CM | POA: Diagnosis not present

## 2016-06-05 DIAGNOSIS — M9901 Segmental and somatic dysfunction of cervical region: Secondary | ICD-10-CM | POA: Diagnosis not present

## 2016-06-05 DIAGNOSIS — M5136 Other intervertebral disc degeneration, lumbar region: Secondary | ICD-10-CM | POA: Diagnosis not present

## 2016-06-05 DIAGNOSIS — M9903 Segmental and somatic dysfunction of lumbar region: Secondary | ICD-10-CM | POA: Diagnosis not present

## 2016-06-24 DIAGNOSIS — C61 Malignant neoplasm of prostate: Secondary | ICD-10-CM | POA: Diagnosis not present

## 2016-07-01 DIAGNOSIS — M5136 Other intervertebral disc degeneration, lumbar region: Secondary | ICD-10-CM | POA: Diagnosis not present

## 2016-07-01 DIAGNOSIS — M5031 Other cervical disc degeneration,  high cervical region: Secondary | ICD-10-CM | POA: Diagnosis not present

## 2016-07-01 DIAGNOSIS — M9901 Segmental and somatic dysfunction of cervical region: Secondary | ICD-10-CM | POA: Diagnosis not present

## 2016-07-01 DIAGNOSIS — M9903 Segmental and somatic dysfunction of lumbar region: Secondary | ICD-10-CM | POA: Diagnosis not present

## 2016-07-05 DIAGNOSIS — M9903 Segmental and somatic dysfunction of lumbar region: Secondary | ICD-10-CM | POA: Diagnosis not present

## 2016-07-05 DIAGNOSIS — M5136 Other intervertebral disc degeneration, lumbar region: Secondary | ICD-10-CM | POA: Diagnosis not present

## 2016-07-05 DIAGNOSIS — M9901 Segmental and somatic dysfunction of cervical region: Secondary | ICD-10-CM | POA: Diagnosis not present

## 2016-07-05 DIAGNOSIS — M5031 Other cervical disc degeneration,  high cervical region: Secondary | ICD-10-CM | POA: Diagnosis not present

## 2016-08-19 DIAGNOSIS — H469 Unspecified optic neuritis: Secondary | ICD-10-CM | POA: Diagnosis not present

## 2016-08-19 DIAGNOSIS — H40013 Open angle with borderline findings, low risk, bilateral: Secondary | ICD-10-CM | POA: Diagnosis not present

## 2016-08-19 DIAGNOSIS — M791 Myalgia: Secondary | ICD-10-CM | POA: Diagnosis not present

## 2016-08-19 DIAGNOSIS — H47011 Ischemic optic neuropathy, right eye: Secondary | ICD-10-CM | POA: Diagnosis not present

## 2016-08-19 DIAGNOSIS — H4710 Unspecified papilledema: Secondary | ICD-10-CM | POA: Diagnosis not present

## 2016-08-22 DIAGNOSIS — H21561 Pupillary abnormality, right eye: Secondary | ICD-10-CM | POA: Diagnosis not present

## 2016-08-22 DIAGNOSIS — H47021 Hemorrhage in optic nerve sheath, right eye: Secondary | ICD-10-CM | POA: Diagnosis not present

## 2016-08-22 DIAGNOSIS — H4710 Unspecified papilledema: Secondary | ICD-10-CM | POA: Diagnosis not present

## 2016-08-22 DIAGNOSIS — H47011 Ischemic optic neuropathy, right eye: Secondary | ICD-10-CM | POA: Diagnosis not present

## 2016-08-27 DIAGNOSIS — M5136 Other intervertebral disc degeneration, lumbar region: Secondary | ICD-10-CM | POA: Diagnosis not present

## 2016-08-27 DIAGNOSIS — M9900 Segmental and somatic dysfunction of head region: Secondary | ICD-10-CM | POA: Diagnosis not present

## 2016-08-27 DIAGNOSIS — M5134 Other intervertebral disc degeneration, thoracic region: Secondary | ICD-10-CM | POA: Diagnosis not present

## 2016-08-27 DIAGNOSIS — M9902 Segmental and somatic dysfunction of thoracic region: Secondary | ICD-10-CM | POA: Diagnosis not present

## 2016-08-29 DIAGNOSIS — M5136 Other intervertebral disc degeneration, lumbar region: Secondary | ICD-10-CM | POA: Diagnosis not present

## 2016-08-29 DIAGNOSIS — M5134 Other intervertebral disc degeneration, thoracic region: Secondary | ICD-10-CM | POA: Diagnosis not present

## 2016-08-29 DIAGNOSIS — M9903 Segmental and somatic dysfunction of lumbar region: Secondary | ICD-10-CM | POA: Diagnosis not present

## 2016-08-29 DIAGNOSIS — M9902 Segmental and somatic dysfunction of thoracic region: Secondary | ICD-10-CM | POA: Diagnosis not present

## 2016-09-02 DIAGNOSIS — M5136 Other intervertebral disc degeneration, lumbar region: Secondary | ICD-10-CM | POA: Diagnosis not present

## 2016-09-02 DIAGNOSIS — M5134 Other intervertebral disc degeneration, thoracic region: Secondary | ICD-10-CM | POA: Diagnosis not present

## 2016-09-02 DIAGNOSIS — M9902 Segmental and somatic dysfunction of thoracic region: Secondary | ICD-10-CM | POA: Diagnosis not present

## 2016-09-02 DIAGNOSIS — M9903 Segmental and somatic dysfunction of lumbar region: Secondary | ICD-10-CM | POA: Diagnosis not present

## 2016-09-04 DIAGNOSIS — M5134 Other intervertebral disc degeneration, thoracic region: Secondary | ICD-10-CM | POA: Diagnosis not present

## 2016-09-04 DIAGNOSIS — M9903 Segmental and somatic dysfunction of lumbar region: Secondary | ICD-10-CM | POA: Diagnosis not present

## 2016-09-04 DIAGNOSIS — M5136 Other intervertebral disc degeneration, lumbar region: Secondary | ICD-10-CM | POA: Diagnosis not present

## 2016-09-04 DIAGNOSIS — M9902 Segmental and somatic dysfunction of thoracic region: Secondary | ICD-10-CM | POA: Diagnosis not present

## 2016-09-09 DIAGNOSIS — M5134 Other intervertebral disc degeneration, thoracic region: Secondary | ICD-10-CM | POA: Diagnosis not present

## 2016-09-09 DIAGNOSIS — M9902 Segmental and somatic dysfunction of thoracic region: Secondary | ICD-10-CM | POA: Diagnosis not present

## 2016-09-09 DIAGNOSIS — M9903 Segmental and somatic dysfunction of lumbar region: Secondary | ICD-10-CM | POA: Diagnosis not present

## 2016-09-09 DIAGNOSIS — M5136 Other intervertebral disc degeneration, lumbar region: Secondary | ICD-10-CM | POA: Diagnosis not present

## 2016-09-12 ENCOUNTER — Other Ambulatory Visit: Payer: Self-pay | Admitting: Internal Medicine

## 2016-09-12 DIAGNOSIS — H5360 Unspecified night blindness: Secondary | ICD-10-CM | POA: Diagnosis not present

## 2016-09-16 DIAGNOSIS — M9902 Segmental and somatic dysfunction of thoracic region: Secondary | ICD-10-CM | POA: Diagnosis not present

## 2016-09-16 DIAGNOSIS — M5136 Other intervertebral disc degeneration, lumbar region: Secondary | ICD-10-CM | POA: Diagnosis not present

## 2016-09-16 DIAGNOSIS — M9903 Segmental and somatic dysfunction of lumbar region: Secondary | ICD-10-CM | POA: Diagnosis not present

## 2016-09-16 DIAGNOSIS — M5134 Other intervertebral disc degeneration, thoracic region: Secondary | ICD-10-CM | POA: Diagnosis not present

## 2016-09-19 DIAGNOSIS — H47011 Ischemic optic neuropathy, right eye: Secondary | ICD-10-CM | POA: Diagnosis not present

## 2016-09-19 DIAGNOSIS — H4710 Unspecified papilledema: Secondary | ICD-10-CM | POA: Diagnosis not present

## 2016-09-26 DIAGNOSIS — E7800 Pure hypercholesterolemia, unspecified: Secondary | ICD-10-CM | POA: Diagnosis not present

## 2016-09-26 DIAGNOSIS — C61 Malignant neoplasm of prostate: Secondary | ICD-10-CM | POA: Diagnosis not present

## 2016-09-26 DIAGNOSIS — H5360 Unspecified night blindness: Secondary | ICD-10-CM | POA: Diagnosis not present

## 2016-10-02 ENCOUNTER — Ambulatory Visit
Admission: RE | Admit: 2016-10-02 | Discharge: 2016-10-02 | Disposition: A | Discharge status: Home / Self Care | Payer: Medicare Other | Source: Ambulatory Visit | Attending: Internal Medicine | Admitting: Internal Medicine

## 2016-10-02 DIAGNOSIS — H5460 Unqualified visual loss, one eye, unspecified: Secondary | ICD-10-CM | POA: Diagnosis not present

## 2016-10-02 DIAGNOSIS — H5360 Unspecified night blindness: Secondary | ICD-10-CM

## 2016-10-02 MED ORDER — GADOBENATE DIMEGLUMINE 529 MG/ML IV SOLN
19.0000 mL | Freq: Once | INTRAVENOUS | Status: AC | PRN
  Administered 2016-10-02: 19 mL via INTRAVENOUS

## 2016-10-22 DIAGNOSIS — C61 Malignant neoplasm of prostate: Secondary | ICD-10-CM | POA: Diagnosis not present

## 2016-10-30 DIAGNOSIS — C61 Malignant neoplasm of prostate: Secondary | ICD-10-CM | POA: Diagnosis not present

## 2016-12-19 DIAGNOSIS — H40013 Open angle with borderline findings, low risk, bilateral: Secondary | ICD-10-CM | POA: Diagnosis not present

## 2016-12-19 DIAGNOSIS — H1013 Acute atopic conjunctivitis, bilateral: Secondary | ICD-10-CM | POA: Diagnosis not present

## 2016-12-19 DIAGNOSIS — H04123 Dry eye syndrome of bilateral lacrimal glands: Secondary | ICD-10-CM | POA: Diagnosis not present

## 2016-12-19 DIAGNOSIS — H47011 Ischemic optic neuropathy, right eye: Secondary | ICD-10-CM | POA: Diagnosis not present

## 2017-02-10 DIAGNOSIS — N390 Urinary tract infection, site not specified: Secondary | ICD-10-CM | POA: Diagnosis not present

## 2017-02-10 DIAGNOSIS — Z0000 Encounter for general adult medical examination without abnormal findings: Secondary | ICD-10-CM | POA: Diagnosis not present

## 2017-02-10 DIAGNOSIS — E785 Hyperlipidemia, unspecified: Secondary | ICD-10-CM | POA: Diagnosis not present

## 2017-07-10 ENCOUNTER — Encounter (HOSPITAL_COMMUNITY): Payer: Self-pay | Admitting: Family Medicine

## 2017-07-10 ENCOUNTER — Ambulatory Visit (HOSPITAL_COMMUNITY)
Admission: EM | Admit: 2017-07-10 | Discharge: 2017-07-10 | Disposition: A | Discharge status: Home / Self Care | Payer: Medicare Other | Attending: Family Medicine | Admitting: Family Medicine

## 2017-07-10 DIAGNOSIS — H1131 Conjunctival hemorrhage, right eye: Secondary | ICD-10-CM | POA: Diagnosis not present

## 2017-07-10 NOTE — Discharge Instructions (Signed)
This is a subconjunctival hemorrhage- this should resolve on its own as the body/eye reabsorbs the blood.  Please return or go to emergency room if you develop pain, changes in vision, swelling

## 2017-07-10 NOTE — ED Triage Notes (Signed)
Pt here for bleeding to the inner part of his left eye. He denies any vision problems, pain, drainage, etc.

## 2017-07-10 NOTE — ED Provider Notes (Signed)
[facility]    CSN: [id number] Arrival date & time: 07/10/17  1124     History   Chief Complaint Chief Complaint  Patient presents with  . Eye Problem    HPI Scott Schneider is a 77 y.o. male no contributing past medical history presenting today for evaluation of a red eye.  Patient states that he woke up yesterday morning with redness to the inner aspect of his left eye.  He denies any pain, changes in vision or any drainage.  He noticed this when he looked in the mirror.  Patient wears glasses mainly for reading.  HPI  Past Medical History:  Diagnosis Date  . Prostate cancer [facility])     Patient Active Problem List   Diagnosis Date Noted  . Malignant neoplasm of prostate ([place]) 12/18/2015    Past Surgical History:  Procedure Laterality Date  . PROSTATE BIOPSY    . TONSILLECTOMY         Home Medications    Prior to Admission medications   Medication Sig Start Date End Date Taking? Authorizing Provider  acetaminophen (TYLENOL) 325 MG tablet Take 650 mg by mouth every 6 (six) hours as needed.    [provider]  Multiple Vitamins-Minerals (MULTIVITAMIN ADULT PO) Take by mouth.    [provider]    Family History Family History  Problem Relation Age of Onset  . Cancer Mother        breast  . Cancer Maternal Grandmother        uterine  . Cancer Son        kidney cancer/excised    Social History Social History   Tobacco Use  . Smoking status: Never Smoker  . Smokeless tobacco: Never Used  Substance Use Topics  . Alcohol use: No  . Drug use: No     Allergies   Patient has no known allergies.   Review of Systems Review of Systems  Constitutional: Negative for fatigue and fever.  Eyes: Positive for redness. Negative for photophobia, pain, discharge, itching and visual disturbance.  Respiratory: Negative for shortness of breath.   Cardiovascular: Negative for chest pain.  Gastrointestinal: Negative for abdominal pain,  nausea and vomiting.  Neurological: Negative for light-headedness and headaches.     Physical Exam Triage Vital Signs ED Triage Vitals  Enc Vitals Group     BP 07/10/17 1140 130/80     Pulse Rate 07/10/17 1140 67     Resp 07/10/17 1140 18     Temp 07/10/17 1140 98.2 F (36.8 C)     Temp src --      SpO2 07/10/17 1140 97 %     Weight --      Height --      Head Circumference --      Peak Flow --      Pain Score 07/10/17 1139 0     Pain Loc --      Pain Edu? --      Excl. in [place]? --    No data found.  Updated Vital Signs BP 130/80   Pulse 67   Temp 98.2 F (36.8 C)   Resp 18   SpO2 97%   Visual Acuity Right Eye Distance:  20/40 Left Eye Distance:  20/40 Bilateral Distance:  20/30  Right Eye Near:   Left Eye Near:    Bilateral Near:     Physical Exam  Constitutional: He appears well-developed and well-nourished.  HENT:  Head: Normocephalic and  atraumatic.  Eyes: Conjunctivae are normal.  Subconjunctival hemorrhage extending to large portion of medial aspect of left eye, extraocular motions intact, PERRL  Neck: Neck supple.  Cardiovascular: Normal rate and regular rhythm.  No murmur heard. Pulmonary/Chest: Effort normal and breath sounds normal. No respiratory distress.  Abdominal: Soft. There is no tenderness.  Musculoskeletal: He exhibits no edema.  Neurological: He is alert.  Skin: Skin is warm and dry.  Psychiatric: He has a normal mood and affect.  Nursing note and vitals reviewed.    UC Treatments / Results  Labs (all labs ordered are listed, but only abnormal results are displayed) Labs Reviewed - No data to display  EKG None  Radiology No results found.  Procedures Procedures (including critical care time)  Medications Ordered in UC Medications - No data to display  Initial Impression / Assessment and Plan / UC Course  I have reviewed the triage vital signs and the nursing notes.  Pertinent labs & imaging results that were  available during my care of the patient were reviewed by me and considered in my medical decision making (see chart for details).     Patient with subconjunctival hemorrhage, discussed that this is benign and will resolve on its own. Discussed strict return precautions. Patient verbalized understanding and is agreeable with plan.  Final Clinical Impressions(s) / UC Diagnoses   Final diagnoses:  Subconjunctival hemorrhage of right eye     Discharge Instructions     This is a subconjunctival hemorrhage- this should resolve on its own as the body/eye reabsorbs the blood.  Please return or go to emergency room if you develop pain, changes in vision, swelling   ED Prescriptions    None     Controlled Substance Prescriptions [place] Controlled Substance Registry consulted? Not Applicable   Janith Lima, [place] 07/10/17 1159

## 2017-07-17 DIAGNOSIS — M9905 Segmental and somatic dysfunction of pelvic region: Secondary | ICD-10-CM | POA: Diagnosis not present

## 2017-07-17 DIAGNOSIS — M9903 Segmental and somatic dysfunction of lumbar region: Secondary | ICD-10-CM | POA: Diagnosis not present

## 2017-07-17 DIAGNOSIS — M9904 Segmental and somatic dysfunction of sacral region: Secondary | ICD-10-CM | POA: Diagnosis not present

## 2017-07-17 DIAGNOSIS — M5136 Other intervertebral disc degeneration, lumbar region: Secondary | ICD-10-CM | POA: Diagnosis not present

## 2017-10-16 DIAGNOSIS — M9903 Segmental and somatic dysfunction of lumbar region: Secondary | ICD-10-CM | POA: Diagnosis not present

## 2017-10-16 DIAGNOSIS — M5136 Other intervertebral disc degeneration, lumbar region: Secondary | ICD-10-CM | POA: Diagnosis not present

## 2017-10-16 DIAGNOSIS — M9904 Segmental and somatic dysfunction of sacral region: Secondary | ICD-10-CM | POA: Diagnosis not present

## 2017-10-16 DIAGNOSIS — M9905 Segmental and somatic dysfunction of pelvic region: Secondary | ICD-10-CM | POA: Diagnosis not present

## 2017-10-20 DIAGNOSIS — M9903 Segmental and somatic dysfunction of lumbar region: Secondary | ICD-10-CM | POA: Diagnosis not present

## 2017-10-20 DIAGNOSIS — M5136 Other intervertebral disc degeneration, lumbar region: Secondary | ICD-10-CM | POA: Diagnosis not present

## 2017-10-20 DIAGNOSIS — M9904 Segmental and somatic dysfunction of sacral region: Secondary | ICD-10-CM | POA: Diagnosis not present

## 2017-10-20 DIAGNOSIS — M9905 Segmental and somatic dysfunction of pelvic region: Secondary | ICD-10-CM | POA: Diagnosis not present

## 2017-10-22 DIAGNOSIS — M9903 Segmental and somatic dysfunction of lumbar region: Secondary | ICD-10-CM | POA: Diagnosis not present

## 2017-10-22 DIAGNOSIS — M9905 Segmental and somatic dysfunction of pelvic region: Secondary | ICD-10-CM | POA: Diagnosis not present

## 2017-10-22 DIAGNOSIS — M9904 Segmental and somatic dysfunction of sacral region: Secondary | ICD-10-CM | POA: Diagnosis not present

## 2017-10-22 DIAGNOSIS — M5136 Other intervertebral disc degeneration, lumbar region: Secondary | ICD-10-CM | POA: Diagnosis not present

## 2017-10-27 DIAGNOSIS — M9905 Segmental and somatic dysfunction of pelvic region: Secondary | ICD-10-CM | POA: Diagnosis not present

## 2017-10-27 DIAGNOSIS — M5136 Other intervertebral disc degeneration, lumbar region: Secondary | ICD-10-CM | POA: Diagnosis not present

## 2017-10-27 DIAGNOSIS — M9903 Segmental and somatic dysfunction of lumbar region: Secondary | ICD-10-CM | POA: Diagnosis not present

## 2017-10-27 DIAGNOSIS — M9904 Segmental and somatic dysfunction of sacral region: Secondary | ICD-10-CM | POA: Diagnosis not present

## 2017-10-30 DIAGNOSIS — M9904 Segmental and somatic dysfunction of sacral region: Secondary | ICD-10-CM | POA: Diagnosis not present

## 2017-10-30 DIAGNOSIS — M9903 Segmental and somatic dysfunction of lumbar region: Secondary | ICD-10-CM | POA: Diagnosis not present

## 2017-10-30 DIAGNOSIS — M9905 Segmental and somatic dysfunction of pelvic region: Secondary | ICD-10-CM | POA: Diagnosis not present

## 2017-10-30 DIAGNOSIS — M5136 Other intervertebral disc degeneration, lumbar region: Secondary | ICD-10-CM | POA: Diagnosis not present

## 2017-12-20 IMAGING — MR MR MRA HEAD W/O CM
11 of 14 series · 36 of 48 positions shown · IV contrast (multihance)
Comparison: None.

CLINICAL DATA: Right eye visual loss beginning [AGE] ago with
minimal improvement. History of prostate cancer.

Creatinine was obtained on site at [HOSPITAL] at [HOSPITAL].Results: Creatinine 1.1 mg/dL.
EXAM:
MRI HEAD WITHOUT AND WITH CONTRAST
MRA HEAD WITHOUT CONTRAST
TECHNIQUE: Multiplanar, multiecho pulse sequences of the brain and surrounding
structures were obtained without and with intravenous contrast.
Angiographic images of the head were obtained using MRA technique
without contrast.
CONTRAST:  19mL MULTIHANCE GADOBENATE DIMEGLUMINE 529 MG/ML IV SOLN

[Series 3: tof_3d_multi-slab · axial · 0.7mm · 0.35mm/px · z∈[-12,+98]mm · 8 of 162 slices shown]
[im 1/162]
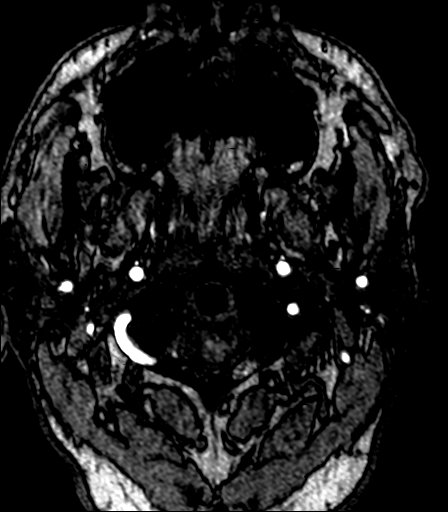
[im 21/162]
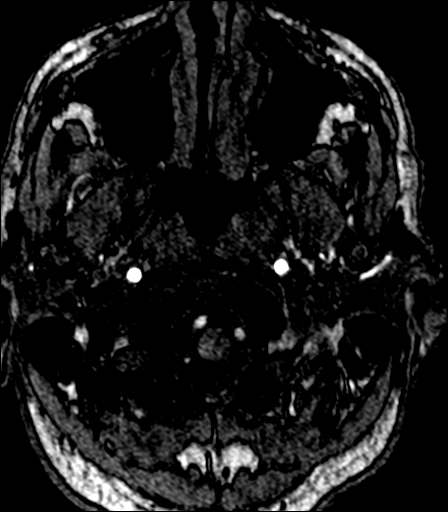
[im 41/162]
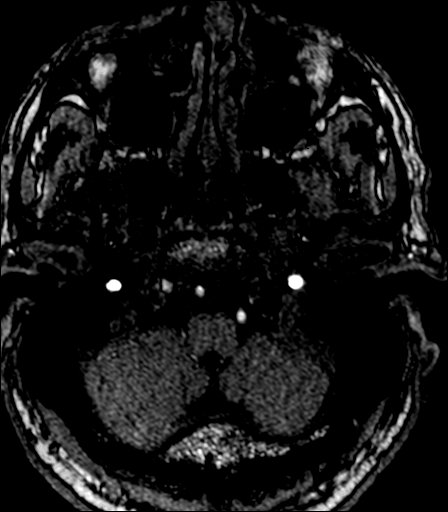
[im 61/162]
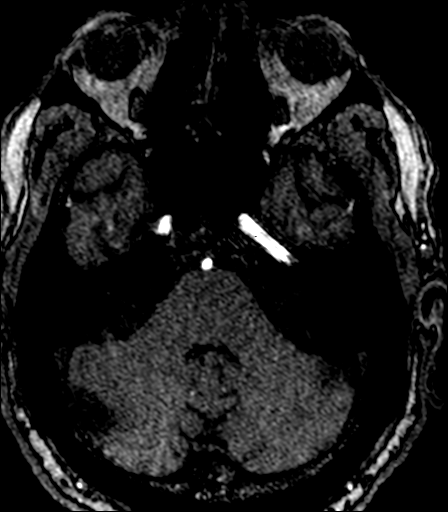
[im 101/162]
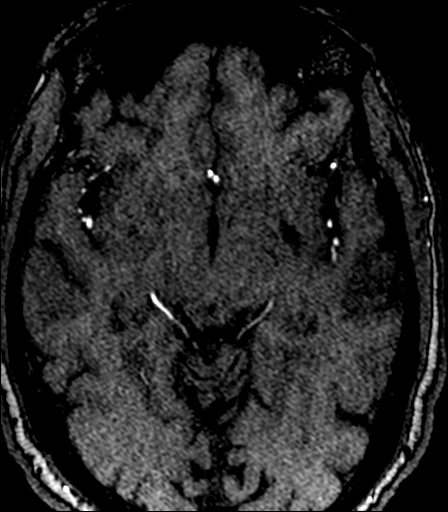
[im 121/162]
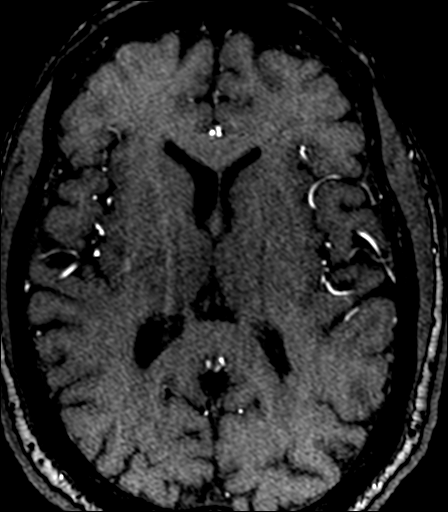
[im 141/162]
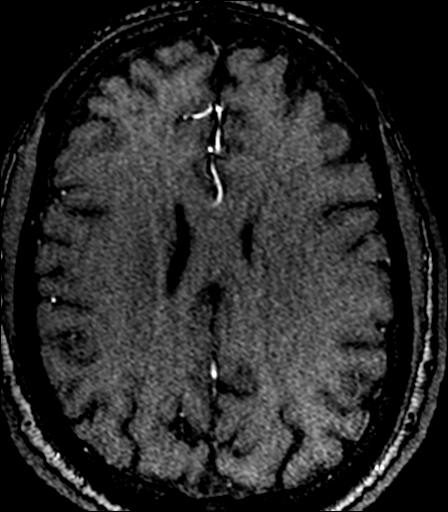
[im 162/162]
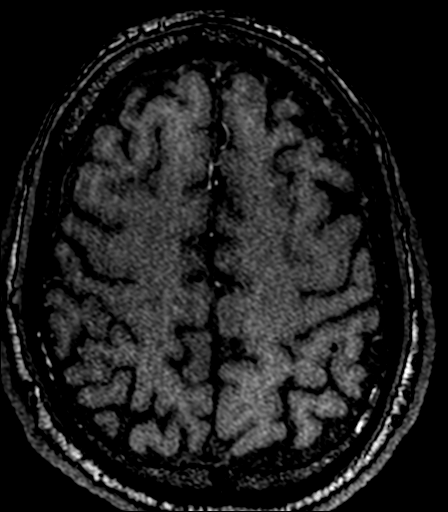

[Series 7: T1 · sagittal · 5.0mm · 0.45mm/px · 1 of 23 slices shown]
[im 1/23]
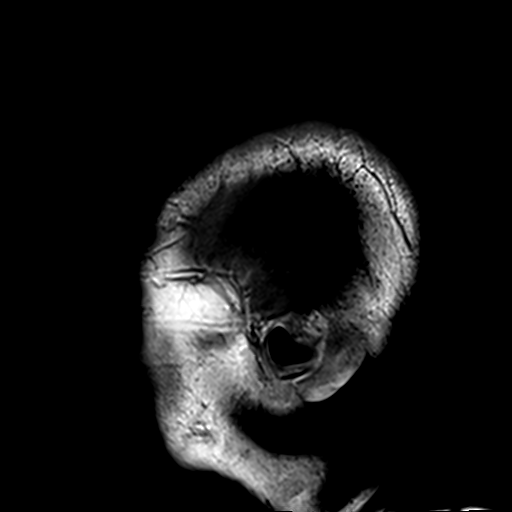

[Series 8: DWI · axial · 3.0mm · 1.80mm/px · z∈[-1,+143]mm · 5 of 99 slices shown (1 of 4)]
[im 1/99]
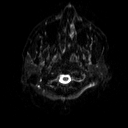
[im 25/99]
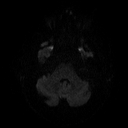
[im 50/99]
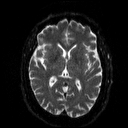
[im 74/99]
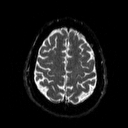
[im 99/99]
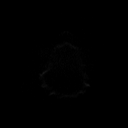

[Series 9: DWI · axial · 3.0mm · 1.80mm/px · z∈[-1,+143]mm · 2 of 48 slices shown (2 of 4)]
[im 1/48]
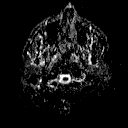
[im 48/48]
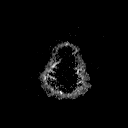

[Series 12: swi_images · axial · 2.0mm · 0.90mm/px · z∈[-7,+148]mm · 4 of 80 slices shown]
[im 1/80]
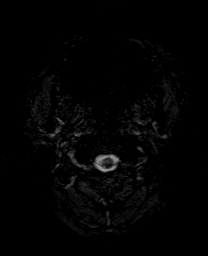
[im 27/80]
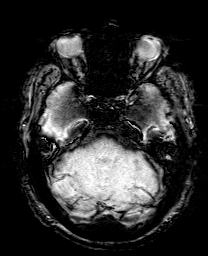
[im 53/80]
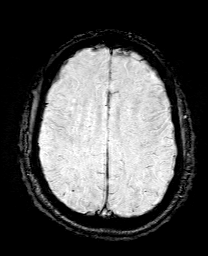
[im 80/80]
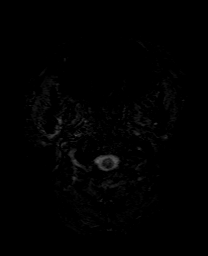

[Series 13: DWI · coronal · 5.0mm · 1.80mm/px · 4 of 71 slices shown (3 of 4)]
[im 1/71]
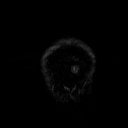
[im 24/71]
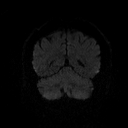
[im 47/71]
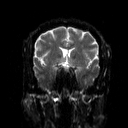
[im 71/71]
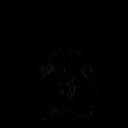

[Series 14: DWI · coronal · 5.0mm · 1.80mm/px · 2 of 36 slices shown (4 of 4)]
[im 1/36]
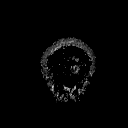
[im 36/36]
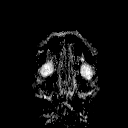

[Series 15: T2 · axial · 5.0mm · 0.51mm/px · 1 of 24 slices shown (1 of 2)]
[im 1/24]
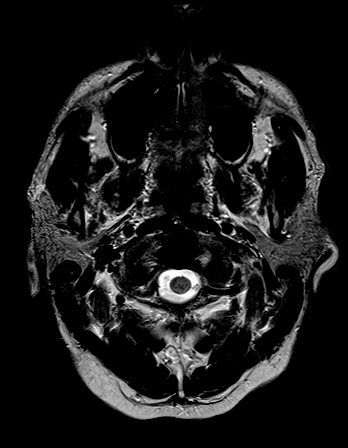

[Series 18: FLAIR · axial · 3.0mm · 0.45mm/px · 1 of 27 slices shown]
[im 1/27]
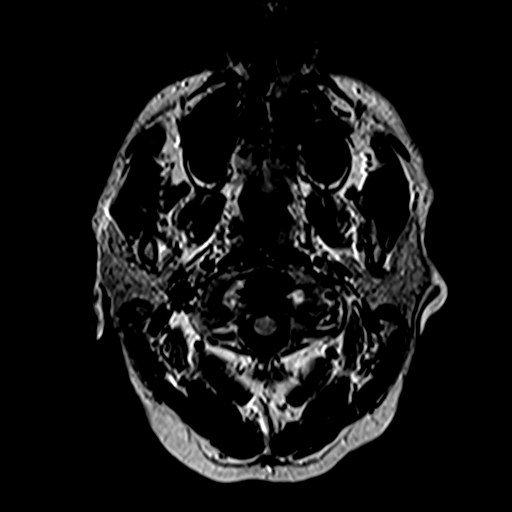

[Series 21: t1_mpr_tra · axial · 1.0mm · 0.45mm/px · z∈[-8,+126]mm · 7 of 160 slices shown]
[im 1/160]
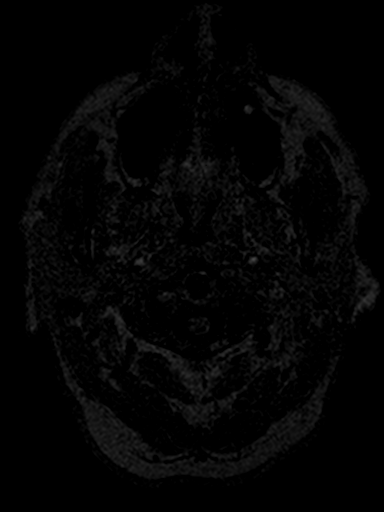
[im 23/160]
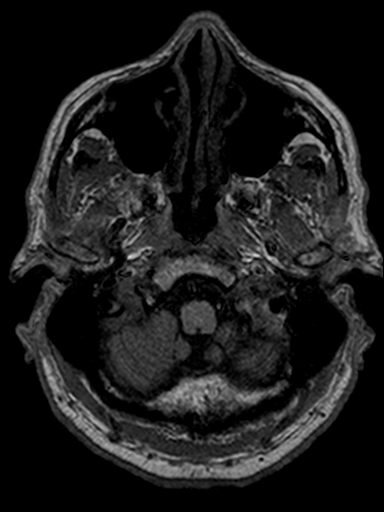
[im 46/160]
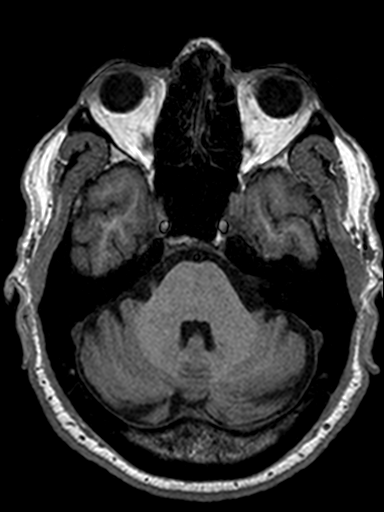
[im 69/160]
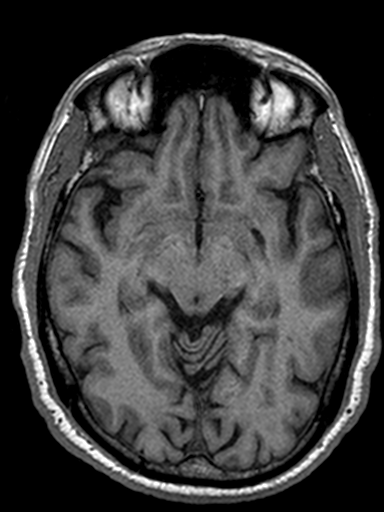
[im 91/160]
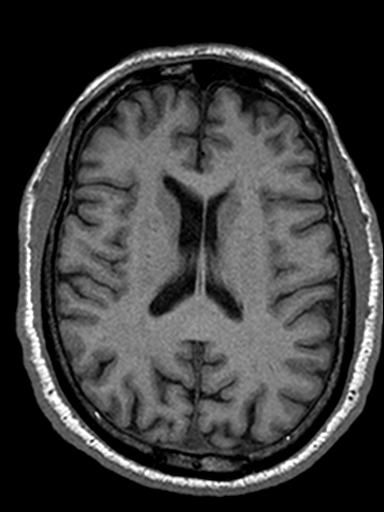
[im 114/160]
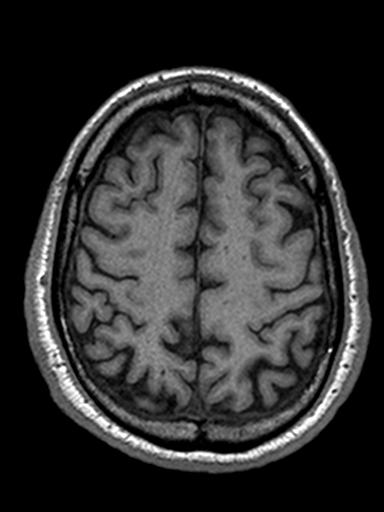
[im 137/160]
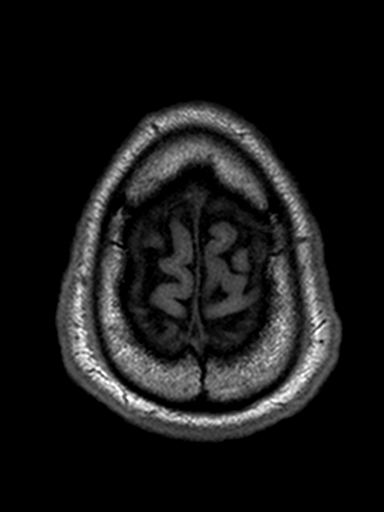

[Series 22: T2 · coronal · 5.0mm · 0.45mm/px · 1 of 30 slices shown (2 of 2)]
[im 1/30]
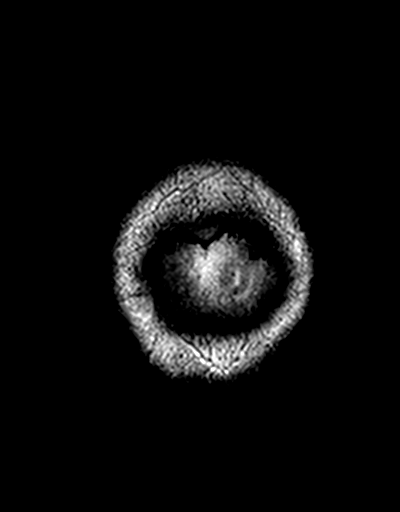

[36 of 48 positions shown; findings below may reference images not displayed]

FINDINGS: MRI HEAD FINDINGS

Brain: There is no evidence of acute infarct, intracranial
hemorrhage, mass, midline shift, or extra-axial fluid collection.
The ventricles and sulci are normal. Punctate foci of T2
hyperintensity scattered throughout the cerebral white matter
bilaterally are nonspecific but compatible with chronic small vessel
ischemic disease, very mild for age. No abnormal enhancement is
identified.

Vascular: Major intracranial vascular flow voids are preserved.

Skull and upper cervical spine: Unremarkable bone marrow signal.

Sinuses/Orbits: Prior left cataract extraction. Small osteoma or
mucous retention cyst containing proteinaceous material in the
inferior left frontal sinus. Paranasal sinuses scratch sec clear
mastoid air cells.

Other: None.

MRA HEAD FINDINGS

The visualized distal vertebral arteries are patent to the basilar
and codominant. The left PICA and right AICA are dominant. Patent
SCA origins are seen bilaterally. The basilar artery is widely
patent. There is a tiny left posterior communicating artery. PCAs
are patent without evidence of significant stenosis.

The internal carotid arteries are patent from skullbase to carotid
termini without evidence of significant stenosis. Focal signal
dropout in the right ICA proximal petrous segment is attributed to
skull base artifact. ACAs and MCAs are patent without evidence of
proximal branch occlusion or definite significant stenosis. There is
artifactually decreased signal through the proximal M2 inferior
divisions bilaterally with an underlying stenosis not excluded on
the right. There is an early bifurcation of the left MCA. No
intracranial aneurysm is identified.
IMPRESSION: 1. No evidence of acute or subacute infarct.
2. Very mild chronic small vessel ischemic disease for age.
3. Patent circle of Willis without evidence of proximal branch
occlusion or flow limiting stenosis.

## 2017-12-25 DIAGNOSIS — H40013 Open angle with borderline findings, low risk, bilateral: Secondary | ICD-10-CM | POA: Diagnosis not present

## 2017-12-25 DIAGNOSIS — H25011 Cortical age-related cataract, right eye: Secondary | ICD-10-CM | POA: Diagnosis not present

## 2017-12-25 DIAGNOSIS — H47011 Ischemic optic neuropathy, right eye: Secondary | ICD-10-CM | POA: Diagnosis not present

## 2017-12-25 DIAGNOSIS — H2511 Age-related nuclear cataract, right eye: Secondary | ICD-10-CM | POA: Diagnosis not present

## 2018-03-23 DIAGNOSIS — M9903 Segmental and somatic dysfunction of lumbar region: Secondary | ICD-10-CM | POA: Diagnosis not present

## 2018-03-23 DIAGNOSIS — M5136 Other intervertebral disc degeneration, lumbar region: Secondary | ICD-10-CM | POA: Diagnosis not present

## 2018-03-23 DIAGNOSIS — M9905 Segmental and somatic dysfunction of pelvic region: Secondary | ICD-10-CM | POA: Diagnosis not present

## 2018-03-23 DIAGNOSIS — M9904 Segmental and somatic dysfunction of sacral region: Secondary | ICD-10-CM | POA: Diagnosis not present

## 2018-03-25 DIAGNOSIS — M5136 Other intervertebral disc degeneration, lumbar region: Secondary | ICD-10-CM | POA: Diagnosis not present

## 2018-03-25 DIAGNOSIS — M9904 Segmental and somatic dysfunction of sacral region: Secondary | ICD-10-CM | POA: Diagnosis not present

## 2018-03-25 DIAGNOSIS — M9903 Segmental and somatic dysfunction of lumbar region: Secondary | ICD-10-CM | POA: Diagnosis not present

## 2018-03-25 DIAGNOSIS — M9905 Segmental and somatic dysfunction of pelvic region: Secondary | ICD-10-CM | POA: Diagnosis not present

## 2018-03-30 DIAGNOSIS — Z23 Encounter for immunization: Secondary | ICD-10-CM | POA: Diagnosis not present

## 2018-03-31 DIAGNOSIS — M9904 Segmental and somatic dysfunction of sacral region: Secondary | ICD-10-CM | POA: Diagnosis not present

## 2018-03-31 DIAGNOSIS — M5136 Other intervertebral disc degeneration, lumbar region: Secondary | ICD-10-CM | POA: Diagnosis not present

## 2018-03-31 DIAGNOSIS — M9903 Segmental and somatic dysfunction of lumbar region: Secondary | ICD-10-CM | POA: Diagnosis not present

## 2018-03-31 DIAGNOSIS — M9905 Segmental and somatic dysfunction of pelvic region: Secondary | ICD-10-CM | POA: Diagnosis not present

## 2018-04-02 DIAGNOSIS — M9904 Segmental and somatic dysfunction of sacral region: Secondary | ICD-10-CM | POA: Diagnosis not present

## 2018-04-02 DIAGNOSIS — M9903 Segmental and somatic dysfunction of lumbar region: Secondary | ICD-10-CM | POA: Diagnosis not present

## 2018-04-02 DIAGNOSIS — M9905 Segmental and somatic dysfunction of pelvic region: Secondary | ICD-10-CM | POA: Diagnosis not present

## 2018-04-02 DIAGNOSIS — M5136 Other intervertebral disc degeneration, lumbar region: Secondary | ICD-10-CM | POA: Diagnosis not present

## 2018-04-06 DIAGNOSIS — M5136 Other intervertebral disc degeneration, lumbar region: Secondary | ICD-10-CM | POA: Diagnosis not present

## 2018-04-06 DIAGNOSIS — M9905 Segmental and somatic dysfunction of pelvic region: Secondary | ICD-10-CM | POA: Diagnosis not present

## 2018-04-06 DIAGNOSIS — M9904 Segmental and somatic dysfunction of sacral region: Secondary | ICD-10-CM | POA: Diagnosis not present

## 2018-04-06 DIAGNOSIS — M9903 Segmental and somatic dysfunction of lumbar region: Secondary | ICD-10-CM | POA: Diagnosis not present

## 2018-06-09 DIAGNOSIS — M9904 Segmental and somatic dysfunction of sacral region: Secondary | ICD-10-CM | POA: Diagnosis not present

## 2018-06-09 DIAGNOSIS — M9905 Segmental and somatic dysfunction of pelvic region: Secondary | ICD-10-CM | POA: Diagnosis not present

## 2018-06-09 DIAGNOSIS — M5136 Other intervertebral disc degeneration, lumbar region: Secondary | ICD-10-CM | POA: Diagnosis not present

## 2018-06-09 DIAGNOSIS — M9903 Segmental and somatic dysfunction of lumbar region: Secondary | ICD-10-CM | POA: Diagnosis not present

## 2018-06-11 DIAGNOSIS — M5136 Other intervertebral disc degeneration, lumbar region: Secondary | ICD-10-CM | POA: Diagnosis not present

## 2018-06-11 DIAGNOSIS — M9905 Segmental and somatic dysfunction of pelvic region: Secondary | ICD-10-CM | POA: Diagnosis not present

## 2018-06-11 DIAGNOSIS — M9904 Segmental and somatic dysfunction of sacral region: Secondary | ICD-10-CM | POA: Diagnosis not present

## 2018-06-11 DIAGNOSIS — M9903 Segmental and somatic dysfunction of lumbar region: Secondary | ICD-10-CM | POA: Diagnosis not present

## 2018-06-24 DIAGNOSIS — M5136 Other intervertebral disc degeneration, lumbar region: Secondary | ICD-10-CM | POA: Diagnosis not present

## 2018-06-24 DIAGNOSIS — M9904 Segmental and somatic dysfunction of sacral region: Secondary | ICD-10-CM | POA: Diagnosis not present

## 2018-06-24 DIAGNOSIS — M9903 Segmental and somatic dysfunction of lumbar region: Secondary | ICD-10-CM | POA: Diagnosis not present

## 2018-06-24 DIAGNOSIS — M9905 Segmental and somatic dysfunction of pelvic region: Secondary | ICD-10-CM | POA: Diagnosis not present

## 2018-06-29 DIAGNOSIS — M9905 Segmental and somatic dysfunction of pelvic region: Secondary | ICD-10-CM | POA: Diagnosis not present

## 2018-06-29 DIAGNOSIS — M9903 Segmental and somatic dysfunction of lumbar region: Secondary | ICD-10-CM | POA: Diagnosis not present

## 2018-06-29 DIAGNOSIS — M9904 Segmental and somatic dysfunction of sacral region: Secondary | ICD-10-CM | POA: Diagnosis not present

## 2018-06-29 DIAGNOSIS — M5136 Other intervertebral disc degeneration, lumbar region: Secondary | ICD-10-CM | POA: Diagnosis not present

## 2018-07-02 DIAGNOSIS — M5136 Other intervertebral disc degeneration, lumbar region: Secondary | ICD-10-CM | POA: Diagnosis not present

## 2018-07-02 DIAGNOSIS — M9905 Segmental and somatic dysfunction of pelvic region: Secondary | ICD-10-CM | POA: Diagnosis not present

## 2018-07-02 DIAGNOSIS — M9903 Segmental and somatic dysfunction of lumbar region: Secondary | ICD-10-CM | POA: Diagnosis not present

## 2018-07-02 DIAGNOSIS — M9904 Segmental and somatic dysfunction of sacral region: Secondary | ICD-10-CM | POA: Diagnosis not present

## 2018-07-07 DIAGNOSIS — M5136 Other intervertebral disc degeneration, lumbar region: Secondary | ICD-10-CM | POA: Diagnosis not present

## 2018-07-07 DIAGNOSIS — M9903 Segmental and somatic dysfunction of lumbar region: Secondary | ICD-10-CM | POA: Diagnosis not present

## 2018-07-07 DIAGNOSIS — M9905 Segmental and somatic dysfunction of pelvic region: Secondary | ICD-10-CM | POA: Diagnosis not present

## 2018-07-07 DIAGNOSIS — M9904 Segmental and somatic dysfunction of sacral region: Secondary | ICD-10-CM | POA: Diagnosis not present

## 2018-07-14 DIAGNOSIS — M9904 Segmental and somatic dysfunction of sacral region: Secondary | ICD-10-CM | POA: Diagnosis not present

## 2018-07-14 DIAGNOSIS — M9903 Segmental and somatic dysfunction of lumbar region: Secondary | ICD-10-CM | POA: Diagnosis not present

## 2018-07-14 DIAGNOSIS — M5136 Other intervertebral disc degeneration, lumbar region: Secondary | ICD-10-CM | POA: Diagnosis not present

## 2018-07-14 DIAGNOSIS — M9905 Segmental and somatic dysfunction of pelvic region: Secondary | ICD-10-CM | POA: Diagnosis not present

## 2018-07-21 DIAGNOSIS — M5136 Other intervertebral disc degeneration, lumbar region: Secondary | ICD-10-CM | POA: Diagnosis not present

## 2018-07-21 DIAGNOSIS — M9904 Segmental and somatic dysfunction of sacral region: Secondary | ICD-10-CM | POA: Diagnosis not present

## 2018-07-21 DIAGNOSIS — M9903 Segmental and somatic dysfunction of lumbar region: Secondary | ICD-10-CM | POA: Diagnosis not present

## 2018-07-21 DIAGNOSIS — M9905 Segmental and somatic dysfunction of pelvic region: Secondary | ICD-10-CM | POA: Diagnosis not present

## 2018-07-28 DIAGNOSIS — M9903 Segmental and somatic dysfunction of lumbar region: Secondary | ICD-10-CM | POA: Diagnosis not present

## 2018-07-28 DIAGNOSIS — M9905 Segmental and somatic dysfunction of pelvic region: Secondary | ICD-10-CM | POA: Diagnosis not present

## 2018-07-28 DIAGNOSIS — M9904 Segmental and somatic dysfunction of sacral region: Secondary | ICD-10-CM | POA: Diagnosis not present

## 2018-07-28 DIAGNOSIS — M5136 Other intervertebral disc degeneration, lumbar region: Secondary | ICD-10-CM | POA: Diagnosis not present

## 2018-08-04 DIAGNOSIS — M9905 Segmental and somatic dysfunction of pelvic region: Secondary | ICD-10-CM | POA: Diagnosis not present

## 2018-08-04 DIAGNOSIS — M9904 Segmental and somatic dysfunction of sacral region: Secondary | ICD-10-CM | POA: Diagnosis not present

## 2018-08-04 DIAGNOSIS — M5136 Other intervertebral disc degeneration, lumbar region: Secondary | ICD-10-CM | POA: Diagnosis not present

## 2018-08-04 DIAGNOSIS — M9903 Segmental and somatic dysfunction of lumbar region: Secondary | ICD-10-CM | POA: Diagnosis not present

## 2018-08-11 DIAGNOSIS — M5136 Other intervertebral disc degeneration, lumbar region: Secondary | ICD-10-CM | POA: Diagnosis not present

## 2018-08-11 DIAGNOSIS — M9905 Segmental and somatic dysfunction of pelvic region: Secondary | ICD-10-CM | POA: Diagnosis not present

## 2018-08-11 DIAGNOSIS — M9903 Segmental and somatic dysfunction of lumbar region: Secondary | ICD-10-CM | POA: Diagnosis not present

## 2018-08-11 DIAGNOSIS — M9904 Segmental and somatic dysfunction of sacral region: Secondary | ICD-10-CM | POA: Diagnosis not present

## 2018-08-18 DIAGNOSIS — M9903 Segmental and somatic dysfunction of lumbar region: Secondary | ICD-10-CM | POA: Diagnosis not present

## 2018-08-18 DIAGNOSIS — M9905 Segmental and somatic dysfunction of pelvic region: Secondary | ICD-10-CM | POA: Diagnosis not present

## 2018-08-18 DIAGNOSIS — M9904 Segmental and somatic dysfunction of sacral region: Secondary | ICD-10-CM | POA: Diagnosis not present

## 2018-08-18 DIAGNOSIS — M5136 Other intervertebral disc degeneration, lumbar region: Secondary | ICD-10-CM | POA: Diagnosis not present

## 2018-12-29 DIAGNOSIS — H2511 Age-related nuclear cataract, right eye: Secondary | ICD-10-CM | POA: Diagnosis not present

## 2018-12-29 DIAGNOSIS — H47011 Ischemic optic neuropathy, right eye: Secondary | ICD-10-CM | POA: Diagnosis not present

## 2018-12-29 DIAGNOSIS — H40031 Anatomical narrow angle, right eye: Secondary | ICD-10-CM | POA: Diagnosis not present

## 2018-12-29 DIAGNOSIS — H40013 Open angle with borderline findings, low risk, bilateral: Secondary | ICD-10-CM | POA: Diagnosis not present

## 2019-01-02 ENCOUNTER — Other Ambulatory Visit: Payer: Self-pay

## 2019-01-02 DIAGNOSIS — Z20822 Contact with and (suspected) exposure to covid-19: Secondary | ICD-10-CM

## 2019-01-03 LAB — NOVEL CORONAVIRUS, NAA: SARS-CoV-2, NAA: NOT DETECTED

## 2019-02-10 DIAGNOSIS — R310 Gross hematuria: Secondary | ICD-10-CM | POA: Diagnosis not present

## 2019-02-10 DIAGNOSIS — N390 Urinary tract infection, site not specified: Secondary | ICD-10-CM | POA: Diagnosis not present

## 2019-02-10 DIAGNOSIS — M79605 Pain in left leg: Secondary | ICD-10-CM | POA: Diagnosis not present

## 2019-04-14 DIAGNOSIS — M5136 Other intervertebral disc degeneration, lumbar region: Secondary | ICD-10-CM | POA: Diagnosis not present

## 2019-04-14 DIAGNOSIS — M9905 Segmental and somatic dysfunction of pelvic region: Secondary | ICD-10-CM | POA: Diagnosis not present

## 2019-04-14 DIAGNOSIS — M9903 Segmental and somatic dysfunction of lumbar region: Secondary | ICD-10-CM | POA: Diagnosis not present

## 2019-04-14 DIAGNOSIS — M9904 Segmental and somatic dysfunction of sacral region: Secondary | ICD-10-CM | POA: Diagnosis not present

## 2019-04-15 DIAGNOSIS — M9904 Segmental and somatic dysfunction of sacral region: Secondary | ICD-10-CM | POA: Diagnosis not present

## 2019-04-15 DIAGNOSIS — M9903 Segmental and somatic dysfunction of lumbar region: Secondary | ICD-10-CM | POA: Diagnosis not present

## 2019-04-15 DIAGNOSIS — M5136 Other intervertebral disc degeneration, lumbar region: Secondary | ICD-10-CM | POA: Diagnosis not present

## 2019-04-15 DIAGNOSIS — M9905 Segmental and somatic dysfunction of pelvic region: Secondary | ICD-10-CM | POA: Diagnosis not present

## 2019-04-19 DIAGNOSIS — M9905 Segmental and somatic dysfunction of pelvic region: Secondary | ICD-10-CM | POA: Diagnosis not present

## 2019-04-19 DIAGNOSIS — M9904 Segmental and somatic dysfunction of sacral region: Secondary | ICD-10-CM | POA: Diagnosis not present

## 2019-04-19 DIAGNOSIS — M9903 Segmental and somatic dysfunction of lumbar region: Secondary | ICD-10-CM | POA: Diagnosis not present

## 2019-04-19 DIAGNOSIS — M5136 Other intervertebral disc degeneration, lumbar region: Secondary | ICD-10-CM | POA: Diagnosis not present

## 2019-04-21 DIAGNOSIS — R310 Gross hematuria: Secondary | ICD-10-CM | POA: Diagnosis not present

## 2019-04-22 DIAGNOSIS — M9903 Segmental and somatic dysfunction of lumbar region: Secondary | ICD-10-CM | POA: Diagnosis not present

## 2019-04-22 DIAGNOSIS — M9904 Segmental and somatic dysfunction of sacral region: Secondary | ICD-10-CM | POA: Diagnosis not present

## 2019-04-22 DIAGNOSIS — M5136 Other intervertebral disc degeneration, lumbar region: Secondary | ICD-10-CM | POA: Diagnosis not present

## 2019-04-22 DIAGNOSIS — M9905 Segmental and somatic dysfunction of pelvic region: Secondary | ICD-10-CM | POA: Diagnosis not present

## 2019-04-27 DIAGNOSIS — M9903 Segmental and somatic dysfunction of lumbar region: Secondary | ICD-10-CM | POA: Diagnosis not present

## 2019-04-27 DIAGNOSIS — M9905 Segmental and somatic dysfunction of pelvic region: Secondary | ICD-10-CM | POA: Diagnosis not present

## 2019-04-27 DIAGNOSIS — M9904 Segmental and somatic dysfunction of sacral region: Secondary | ICD-10-CM | POA: Diagnosis not present

## 2019-04-27 DIAGNOSIS — M5136 Other intervertebral disc degeneration, lumbar region: Secondary | ICD-10-CM | POA: Diagnosis not present

## 2019-05-03 DIAGNOSIS — M5136 Other intervertebral disc degeneration, lumbar region: Secondary | ICD-10-CM | POA: Diagnosis not present

## 2019-05-03 DIAGNOSIS — M9904 Segmental and somatic dysfunction of sacral region: Secondary | ICD-10-CM | POA: Diagnosis not present

## 2019-05-03 DIAGNOSIS — M9903 Segmental and somatic dysfunction of lumbar region: Secondary | ICD-10-CM | POA: Diagnosis not present

## 2019-05-03 DIAGNOSIS — M9905 Segmental and somatic dysfunction of pelvic region: Secondary | ICD-10-CM | POA: Diagnosis not present

## 2019-05-06 DIAGNOSIS — M9904 Segmental and somatic dysfunction of sacral region: Secondary | ICD-10-CM | POA: Diagnosis not present

## 2019-05-06 DIAGNOSIS — M5136 Other intervertebral disc degeneration, lumbar region: Secondary | ICD-10-CM | POA: Diagnosis not present

## 2019-05-06 DIAGNOSIS — M9903 Segmental and somatic dysfunction of lumbar region: Secondary | ICD-10-CM | POA: Diagnosis not present

## 2019-05-06 DIAGNOSIS — M9905 Segmental and somatic dysfunction of pelvic region: Secondary | ICD-10-CM | POA: Diagnosis not present

## 2019-05-11 DIAGNOSIS — M9904 Segmental and somatic dysfunction of sacral region: Secondary | ICD-10-CM | POA: Diagnosis not present

## 2019-05-11 DIAGNOSIS — M9905 Segmental and somatic dysfunction of pelvic region: Secondary | ICD-10-CM | POA: Diagnosis not present

## 2019-05-11 DIAGNOSIS — M5136 Other intervertebral disc degeneration, lumbar region: Secondary | ICD-10-CM | POA: Diagnosis not present

## 2019-05-11 DIAGNOSIS — M9903 Segmental and somatic dysfunction of lumbar region: Secondary | ICD-10-CM | POA: Diagnosis not present

## 2019-05-17 ENCOUNTER — Encounter (HOSPITAL_COMMUNITY): Payer: Self-pay | Admitting: Emergency Medicine

## 2019-05-17 ENCOUNTER — Other Ambulatory Visit: Payer: Self-pay

## 2019-05-17 ENCOUNTER — Ambulatory Visit (HOSPITAL_COMMUNITY)
Admission: EM | Admit: 2019-05-17 | Discharge: 2019-05-17 | Disposition: A | Discharge status: Home / Self Care | Payer: Medicare Other

## 2019-05-17 DIAGNOSIS — M79604 Pain in right leg: Secondary | ICD-10-CM | POA: Diagnosis not present

## 2019-05-17 DIAGNOSIS — M79605 Pain in left leg: Secondary | ICD-10-CM | POA: Diagnosis not present

## 2019-05-17 NOTE — Discharge Instructions (Signed)
I would like for you to follow-up with your primary care or call the internal medicine clinic attached for continued evaluation as this is something that requires continued evaluation and we are unable to fully assess this in urgent care setting.  However I do not believe this is an urgent or emergent issue at this time and you will be safe to follow-up in 1 to 2 weeks with your primary care or new provider.

## 2019-05-17 NOTE — ED Triage Notes (Signed)
Pt sts some left lower back/buttocks pain and "pulsing sensation" x several weeks; pt sts same feeling down his legs with some numbness at night that resolves once he gets up

## 2019-05-17 NOTE — ED Provider Notes (Signed)
[place]    CSN: [id number] Arrival date & time: 05/17/19  1143      History   Chief Complaint Chief Complaint  Patient presents with  . Leg Pain    HPI Scott Schneider is a 79 y.o. male.   Patient reports for 3 to 4 months of leg discomfort that is primarily at night.  Reports the discomfort is" throbbing" and that it feels weak.  He reports this primarily happens when he is been laying down or sleeping.  This throbbing sensation is in both his lower legs.  He reports that it takes 15 to 20 minutes for this to resolve when he gets up.  He does not have issues with this when he is up walking around.  Denies any pain with walking.  Denies tingling sensation or numbness in his legs.  Denies leg swelling.  Patient reports he is primarily here for guidance on what he should do next with his symptoms.  He has reported these to his primary care however felt that they did not properly address it.  Overall he states that his symptoms have stayed consistent over the last 3 to 4 months and are not acutely worse today.     Past Medical History:  Diagnosis Date  . Prostate cancer [facility])     Patient Active Problem List   Diagnosis Date Noted  . Malignant neoplasm of prostate ([place]) 12/18/2015    Past Surgical History:  Procedure Laterality Date  . PROSTATE BIOPSY    . TONSILLECTOMY         Home Medications    Prior to Admission medications   Medication Sig Start Date End Date Taking? Authorizing Provider  acetaminophen (TYLENOL) 325 MG tablet Take 650 mg by mouth every 6 (six) hours as needed.    [provider]  Multiple Vitamins-Minerals (MULTIVITAMIN ADULT PO) Take by mouth.    [provider]    Family History Family History  Problem Relation Age of Onset  . Cancer Mother        breast  . Cancer Maternal Grandmother        uterine  . Cancer Son        kidney cancer/excised    Social History Social History   Tobacco Use  . Smoking  status: Never Smoker  . Smokeless tobacco: Never Used  Substance Use Topics  . Alcohol use: No  . Drug use: No     Allergies   Patient has no known allergies.   Review of Systems Review of Systems  Constitutional: Negative for chills and fever.  HENT: Negative for ear pain and sore throat.   Eyes: Negative for pain and visual disturbance.  Respiratory: Negative for cough and shortness of breath.   Cardiovascular: Negative for chest pain and palpitations.  Gastrointestinal: Negative for abdominal pain and vomiting.  Genitourinary: Negative for dysuria and hematuria.  Musculoskeletal: Negative for arthralgias, back pain and joint swelling.  Skin: Negative for color change and rash.  Neurological: Negative for seizures, syncope, weakness, numbness and headaches.  All other systems reviewed and are negative.    Physical Exam Triage Vital Signs ED Triage Vitals [05/17/19 1232]  Enc Vitals Group     BP (!) 142/84     Pulse Rate 80     Resp 18     Temp 97.9 F (36.6 C)     Temp Source Oral     SpO2 97 %     Weight  Height      Head Circumference      Peak Flow      Pain Score 0     Pain Loc      Pain Edu?      Excl. in [place]?    No data found.  Updated Vital Signs BP (!) 142/84 (BP Location: Right Arm)   Pulse 80   Temp 97.9 F (36.6 C) (Oral)   Resp 18   SpO2 97%   Visual Acuity Right Eye Distance:   Left Eye Distance:   Bilateral Distance:    Right Eye Near:   Left Eye Near:    Bilateral Near:     Physical Exam Vitals and nursing note reviewed.  Constitutional:      General: He is not in acute distress.    Appearance: Normal appearance. He is well-developed. He is not ill-appearing.  HENT:     Head: Normocephalic and atraumatic.  Eyes:     Extraocular Movements: Extraocular movements intact.     Conjunctiva/sclera: Conjunctivae normal.     Pupils: Pupils are equal, round, and reactive to light.  Cardiovascular:     Rate and Rhythm: Normal  rate and regular rhythm.     Heart sounds: No murmur.     Comments: Patient has 2+ pulses the posterior tibialis, dorsalis pedis difficult to palpate however he cap refill is less than 2 seconds in all digits. Pulmonary:     Effort: Pulmonary effort is normal. No respiratory distress.     Breath sounds: Normal breath sounds.  Abdominal:     Palpations: Abdomen is soft.     Tenderness: There is no abdominal tenderness.  Musculoskeletal:        General: Normal range of motion.     Cervical back: Neck supple.     Right lower leg: No edema.     Left lower leg: No edema.     Comments: No spinal tenderness.  Straight leg raise negative.  Patient has full range of motion in all directions of the spinal column  Skin:    General: Skin is warm and dry.  Neurological:     General: No focal deficit present.     Mental Status: He is alert and oriented to person, place, and time.     Cranial Nerves: No cranial nerve deficit.     Gait: Gait normal.     Comments: Patient has 5/5 strength throughout his lower extremities.  Sensation is equal bilaterally to soft touch in lower extremities.  DTRs 2+ bilaterally patella  Patient ambulates well in and out of clinic.  Psychiatric:        Mood and Affect: Mood normal.        Behavior: Behavior normal.        Thought Content: Thought content normal.        Judgment: Judgment normal.      UC Treatments / Results  Labs (all labs ordered are listed, but only abnormal results are displayed) Labs Reviewed - No data to display  EKG   Radiology No results found.  Procedures Procedures (including critical care time)  Medications Ordered in UC Medications - No data to display  Initial Impression / Assessment and Plan / UC Course  I have reviewed the triage vital signs and the nursing notes.  Pertinent labs & imaging results that were available during my care of the patient were reviewed by me and considered in my medical decision making (see  chart for details).     #  Lower leg pain Patient 79 year old male presenting with several month history of lower leg discomfort.  Differential would be to include peripheral vascular disease versus restless leg versus neuropathic causes.  There are no acute concerns today for further work-up in the clinic.  We discussed that we could run basic electrolyte and vitamin D lab work however he would wish to follow-up with his primary care or establish with a new primary care physician at this time.  We discussed that this would be the best way to isolate which specialist he needs to see next.  Patient feels comfortable this plan and will follow up with primary care. Final Clinical Impressions(s) / UC Diagnoses   Final diagnoses:  Pain in both lower extremities     Discharge Instructions     I would like for you to follow-up with your primary care or call the internal medicine clinic attached for continued evaluation as this is something that requires continued evaluation and we are unable to fully assess this in urgent care setting.  However I do not believe this is an urgent or emergent issue at this time and you will be safe to follow-up in 1 to 2 weeks with your primary care or new provider.      ED Prescriptions    None     PDMP not reviewed this encounter.   Purnell Shoemaker, PA-C 05/17/19 1757

## 2019-06-14 DIAGNOSIS — R079 Chest pain, unspecified: Secondary | ICD-10-CM | POA: Diagnosis not present

## 2019-06-14 DIAGNOSIS — R001 Bradycardia, unspecified: Secondary | ICD-10-CM | POA: Diagnosis not present

## 2019-06-14 DIAGNOSIS — R202 Paresthesia of skin: Secondary | ICD-10-CM | POA: Diagnosis not present

## 2019-06-14 DIAGNOSIS — M5137 Other intervertebral disc degeneration, lumbosacral region: Secondary | ICD-10-CM | POA: Diagnosis not present

## 2019-06-14 DIAGNOSIS — M545 Low back pain: Secondary | ICD-10-CM | POA: Diagnosis not present

## 2019-06-14 DIAGNOSIS — M79604 Pain in right leg: Secondary | ICD-10-CM | POA: Diagnosis not present

## 2019-06-22 DIAGNOSIS — H47011 Ischemic optic neuropathy, right eye: Secondary | ICD-10-CM | POA: Diagnosis not present

## 2019-06-22 DIAGNOSIS — H40013 Open angle with borderline findings, low risk, bilateral: Secondary | ICD-10-CM | POA: Diagnosis not present

## 2019-06-22 DIAGNOSIS — H40031 Anatomical narrow angle, right eye: Secondary | ICD-10-CM | POA: Diagnosis not present

## 2019-10-25 DIAGNOSIS — R310 Gross hematuria: Secondary | ICD-10-CM | POA: Diagnosis not present

## 2019-10-27 DIAGNOSIS — M47896 Other spondylosis, lumbar region: Secondary | ICD-10-CM | POA: Diagnosis not present

## 2019-10-27 DIAGNOSIS — M545 Low back pain: Secondary | ICD-10-CM | POA: Diagnosis not present

## 2019-10-27 DIAGNOSIS — R202 Paresthesia of skin: Secondary | ICD-10-CM | POA: Diagnosis not present

## 2019-10-27 DIAGNOSIS — M5416 Radiculopathy, lumbar region: Secondary | ICD-10-CM | POA: Diagnosis not present

## 2019-11-18 DIAGNOSIS — M5126 Other intervertebral disc displacement, lumbar region: Secondary | ICD-10-CM | POA: Diagnosis not present

## 2019-11-18 DIAGNOSIS — M48061 Spinal stenosis, lumbar region without neurogenic claudication: Secondary | ICD-10-CM | POA: Diagnosis not present

## 2019-11-18 DIAGNOSIS — M4807 Spinal stenosis, lumbosacral region: Secondary | ICD-10-CM | POA: Diagnosis not present

## 2019-11-18 DIAGNOSIS — M4316 Spondylolisthesis, lumbar region: Secondary | ICD-10-CM | POA: Diagnosis not present

## 2019-11-18 DIAGNOSIS — M47816 Spondylosis without myelopathy or radiculopathy, lumbar region: Secondary | ICD-10-CM | POA: Diagnosis not present

## 2019-11-25 DIAGNOSIS — M5416 Radiculopathy, lumbar region: Secondary | ICD-10-CM | POA: Diagnosis not present

## 2019-11-25 DIAGNOSIS — M47896 Other spondylosis, lumbar region: Secondary | ICD-10-CM | POA: Diagnosis not present

## 2019-11-25 DIAGNOSIS — M48061 Spinal stenosis, lumbar region without neurogenic claudication: Secondary | ICD-10-CM | POA: Diagnosis not present

## 2019-11-25 DIAGNOSIS — R202 Paresthesia of skin: Secondary | ICD-10-CM | POA: Diagnosis not present

## 2019-12-07 ENCOUNTER — Encounter: Payer: Self-pay | Admitting: *Deleted

## 2019-12-08 ENCOUNTER — Ambulatory Visit: Payer: Medicare Other | Admitting: Diagnostic Neuroimaging

## 2019-12-08 ENCOUNTER — Encounter: Payer: Self-pay | Admitting: Diagnostic Neuroimaging

## 2019-12-08 VITALS — BP 129/76 | HR 83 | Ht 70.0 in | Wt 205.6 lb

## 2019-12-08 DIAGNOSIS — R200 Anesthesia of skin: Secondary | ICD-10-CM

## 2019-12-08 DIAGNOSIS — M62838 Other muscle spasm: Secondary | ICD-10-CM | POA: Diagnosis not present

## 2019-12-08 DIAGNOSIS — R209 Unspecified disturbances of skin sensation: Secondary | ICD-10-CM | POA: Diagnosis not present

## 2019-12-08 DIAGNOSIS — R202 Paresthesia of skin: Secondary | ICD-10-CM | POA: Diagnosis not present

## 2019-12-08 DIAGNOSIS — R6889 Other general symptoms and signs: Secondary | ICD-10-CM | POA: Diagnosis not present

## 2019-12-08 DIAGNOSIS — R252 Cramp and spasm: Secondary | ICD-10-CM | POA: Diagnosis not present

## 2019-12-08 NOTE — Progress Notes (Signed)
[facility]  PATIENT: Scott Schneider DOB: 1940-10-07  REFERRING CLINICIAN: Starling Manns, MD HISTORY FROM: patient  REASON FOR VISIT: new consult    HISTORICAL  CHIEF COMPLAINT:  Chief Complaint  Patient presents with  . Spinal stenosis, lumbar    rm 7 New Pt "having problems with my feet-night time x 1 year, my toes draw up, can't move them, some discomfort/numbness up to my calf; trying to figure out why, had MRI at [facility]"    HISTORY OF PRESENT ILLNESS:   79 year old male here for evaluation of abnormal sensation in toes and feet.  For past 1 year patient has had intermittent cramps in toes curling up sensation in bilateral feet, mainly in the evening and nighttime when he is ready to go to sleep.  He has some intermittent numbness and tingling in his toes.  He also has chronic low back pain.  No radiation of pain into his legs.  Symptoms are improved when he stands up and walks.  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   REVIEW OF SYSTEMS: Full 14 system review of systems performed and negative with exception of: As per HPI.  ALLERGIES: No Known Allergies  HOME MEDICATIONS: Outpatient Medications Prior to Visit  Medication Sig Dispense Refill  . acetaminophen (TYLENOL) 325 MG tablet Take 650 mg by mouth every 6 (six) hours as needed.    . brimonidine (ALPHAGAN) 0.2 % ophthalmic solution Place 1 drop into the right eye 2 (two) times daily.    Marland Kitchen gabapentin (NEURONTIN) 100 MG capsule Take 100 mg by mouth. Take 1-2 every night    . Multiple Vitamins-Minerals (MULTIVITAMIN ADULT PO) Take by mouth.    . tamsulosin (FLOMAX) 0.4 MG CAPS capsule Take 0.4 mg by mouth daily.    Marland Kitchen acetaminophen (TYLENOL) 325 MG tablet Take 650 mg by mouth. Every 4 hrs as needed    . Colchicine 0.6 MG CAPS Take by mouth. (Patient not taking: Reported on 12/08/2019)     No facility-administered medications  prior to visit.    PAST MEDICAL HISTORY: Past Medical History:  Diagnosis Date  . H/O back injury 1980's  . Low back pain   . Other spondylosis, lumbar region   . Paresthesia of skin   . Prostate cancer ([place])   . Radiculopathy   . Spinal stenosis of lumbar region     PAST SURGICAL HISTORY: Past Surgical History:  Procedure Laterality Date  . PROSTATE BIOPSY    . TONSILLECTOMY     early 20's    FAMILY HISTORY: Family History  Problem Relation Age of Onset  . Cancer Mother        breast  . Cancer Maternal Grandmother        uterine  . Cancer Son        kidney cancer/excised  . Cirrhosis Father        liver  . Asthma Sister   . Other Brother        AIDS    SOCIAL HISTORY: Social History   Socioeconomic History  . Marital status: Widowed    Spouse name: Not on file  . Number of children: 4  . Years of education: Not on file  . Highest education level: Bachelor's degree (e.g., BA, AB, BS)  Occupational History    Comment: retired  Tobacco Use  . Smoking status: Never Smoker  . Smokeless tobacco: Never Used  Substance  and Sexual Activity  . Alcohol use: No  . Drug use: No  . Sexual activity: Yes  Other Topics Concern  . Not on file  Social History Narrative   Lives alone   Social Determinants of Health   Financial Resource Strain:   . Difficulty of Paying Living Expenses: Not on file  Food Insecurity:   . Worried About Charity fundraiser in the Last Year: Not on file  . Ran Out of Food in the Last Year: Not on file  Transportation Needs:   . Lack of Transportation (Medical): Not on file  . Lack of Transportation (Non-Medical): Not on file  Physical Activity:   . Days of Exercise per Week: Not on file  . Minutes of Exercise per Session: Not on file  Stress:   . Feeling of Stress : Not on file  Social Connections:   . Frequency of Communication with Friends and Family: Not on file  . Frequency of Social Gatherings with Friends and Family: Not on  file  . Attends Religious Services: Not on file  . Active Member of Clubs or Organizations: Not on file  . Attends Archivist Meetings: Not on file  . Marital Status: Not on file  Intimate Partner Violence:   . Fear of Current or Ex-Partner: Not on file  . Emotionally Abused: Not on file  . Physically Abused: Not on file  . Sexually Abused: Not on file     PHYSICAL EXAM  GENERAL EXAM/CONSTITUTIONAL: Vitals:  Vitals:   12/08/19 0812  BP: 129/76  Pulse: 83  Weight: 205 lb 9.6 oz (93.3 kg)  Height: 5' 10"  (1.778 m)     Body mass index is 29.5 kg/m. Wt Readings from Last 3 Encounters:  12/08/19 205 lb 9.6 oz (93.3 kg)  05/14/16 206 lb 6.4 oz (93.6 kg)  03/18/16 209 lb (94.8 kg)     Patient is in no distress; well developed, nourished and groomed; neck is supple  CARDIOVASCULAR:  Examination of carotid arteries is normal; no carotid bruits  Regular rate and rhythm, no murmurs  Examination of peripheral vascular system by observation and palpation is normal  EYES:  Ophthalmoscopic exam of optic discs and posterior segments is normal; no papilledema or hemorrhages  No exam data present  MUSCULOSKELETAL:  Gait, strength, tone, movements noted in Neurologic exam below  NEUROLOGIC: MENTAL STATUS:  No flowsheet data found.  awake, alert, oriented to person, place and time  recent and remote memory intact  normal attention and concentration  language fluent, comprehension intact, naming intact  fund of knowledge appropriate  CRANIAL NERVE:   2nd - no papilledema on fundoscopic exam  2nd, 3rd, 4th, 6th - pupils equal and reactive to light, visual fields full to confrontation, extraocular muscles intact, no nystagmus  5th - facial sensation symmetric  7th - facial strength symmetric  8th - hearing intact  9th - palate elevates symmetrically, uvula midline  11th - shoulder shrug symmetric  12th - tongue protrusion midline  MOTOR:    normal bulk and tone, full strength in the BUE, BLE  SENSORY:   normal and symmetric to light touch, pinprick, temperature, vibration; EXCEPT SLIGHTLY DECR PP IN BOTTOM OF FEET   COORDINATION:   finger-nose-finger, fine finger movements normal  REFLEXES:   deep tendon reflexes TRACE and symmetric; ABSENT AT ANKLES  GAIT/STATION:   narrow based gait     DIAGNOSTIC DATA (LABS, IMAGING, TESTING) - I reviewed patient records, labs, notes, testing  and imaging myself where available.  No results found for: WBC, HGB, HCT, MCV, PLT No results found for: NA, K, CL, CO2, GLUCOSE, BUN, CREATININE, CALCIUM, PROT, ALBUMIN, AST, ALT, ALKPHOS, BILITOT, GFRNONAA, GFRAA No results found for: CHOL, HDL, LDLCALC, LDLDIRECT, TRIG, CHOLHDL No results found for: HGBA1C No results found for: VITAMINB12 No results found for: TSH   11/18/19 MRI lumbar spine [report only] 1. Lumbar spondylosis most significant at L4-5 with grade 1 anterolisthesis with disc bulge and protrusion causing moderate central canal stenosis and moderate neural foraminal narrowing.  2. L5-S1 mild central canal stenosis and moderate right neural foraminal narrowing.      ASSESSMENT AND PLAN  79 y.o. 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  Symptoms likely related to lumbar spine disease.  Will check neuropathy and myopathy labs to rule out other secondary causes.  Dx:  1. Cramps of lower extremity   2. Numbness and tingling of both feet       PLAN:  INTERMITTENT CRAMPS IN FEET / TOES - check neuropathy / myopathy labs (may consider emg in future, but likely low yield given concomitant lumbar spine issues and mild / intermittent symptoms and mild abnl neuro exam) - continue gabapentin 157m at bedtime  LOW BACK PAIN / LUMBAR SPINAL STENOSIS - per orthopedic  surgery  Orders Placed This Encounter  Procedures  . CBC with diff  . CMP  . Vitamin B12  . MMA  . Homocysteine  . A1c  . TSH  . SPEP with IFE  . ANA w/Reflex  . SSA, SSB  . ESR  . CRP  . HIV  . RPR  . Hepatitis B surface antibody, qualitative  . Hepatitis B surface antigen  . Hepatitis B core antibody, total  . Hepatitis C antibody  . Vitamin B1  . Vitamin B6  . ANCA  . CK  . Aldolase   Return for pending if symptoms worsen or fail to improve.    VPenni Bombard MD 90/23/3435 86:86AM Certified in Neurology, Neurophysiology and Neuroimaging  [facility] [address] [place] [phone]

## 2019-12-08 NOTE — Patient Instructions (Signed)
INTERMITTENT CRAMPS IN FEET / TOES - check labs - continue gabapentin 100mg  at bedtime  LOW BACK PAIN / LUMBAR SPINAL STENOSIS - per orthopedic surgery

## 2019-12-09 ENCOUNTER — Encounter: Payer: Medicare Other | Admitting: Diagnostic Neuroimaging

## 2019-12-15 LAB — CBC WITH DIFFERENTIAL/PLATELET
Basophils Absolute: 0 10*3/uL (ref 0.0–0.2)
Basos: 1 %
EOS (ABSOLUTE): 0.1 10*3/uL (ref 0.0–0.4)
Eos: 3 %
Hematocrit: 41 % (ref 37.5–51.0)
Hemoglobin: 13.7 g/dL (ref 13.0–17.7)
Immature Grans (Abs): 0 10*3/uL (ref 0.0–0.1)
Immature Granulocytes: 0 %
Lymphocytes Absolute: 0.9 10*3/uL (ref 0.7–3.1)
Lymphs: 21 %
MCH: 30 pg (ref 26.6–33.0)
MCHC: 33.4 g/dL (ref 31.5–35.7)
MCV: 90 fL (ref 79–97)
Monocytes Absolute: 0.4 10*3/uL (ref 0.1–0.9)
Monocytes: 9 %
Neutrophils Absolute: 2.9 10*3/uL (ref 1.4–7.0)
Neutrophils: 66 %
Platelets: 294 10*3/uL (ref 150–450)
RBC: 4.57 x10E6/uL (ref 4.14–5.80)
RDW: 13.2 % (ref 11.6–15.4)
WBC: 4.4 10*3/uL (ref 3.4–10.8)

## 2019-12-15 LAB — SYPHILIS: RPR W/REFLEX TO RPR TITER AND TREPONEMAL ANTIBODIES, TRADITIONAL SCREENING AND DIAGNOSIS ALGORITHM: RPR Ser Ql: NONREACTIVE

## 2019-12-15 LAB — COMPREHENSIVE METABOLIC PANEL WITH GFR
ALT: 12 [IU]/L (ref 0–44)
AST: 17 [IU]/L (ref 0–40)
Albumin/Globulin Ratio: 1.8 (ref 1.2–2.2)
Albumin: 4.4 g/dL (ref 3.7–4.7)
Alkaline Phosphatase: 58 [IU]/L (ref 44–121)
BUN/Creatinine Ratio: 6 — ABNORMAL LOW (ref 10–24)
BUN: 6 mg/dL — ABNORMAL LOW (ref 8–27)
Bilirubin Total: 0.3 mg/dL (ref 0.0–1.2)
CO2: 26 mmol/L (ref 20–29)
Calcium: 10.1 mg/dL (ref 8.6–10.2)
Chloride: 101 mmol/L (ref 96–106)
Creatinine, Ser: 1.08 mg/dL (ref 0.76–1.27)
GFR calc Af Amer: 76 mL/min/{1.73_m2}
GFR calc non Af Amer: 65 mL/min/{1.73_m2}
Globulin, Total: 2.4 g/dL (ref 1.5–4.5)
Glucose: 88 mg/dL (ref 65–99)
Potassium: 4.6 mmol/L (ref 3.5–5.2)
Sodium: 138 mmol/L (ref 134–144)
Total Protein: 6.8 g/dL (ref 6.0–8.5)

## 2019-12-15 LAB — METHYLMALONIC ACID, SERUM: Methylmalonic Acid: 105 nmol/L (ref 0–378)

## 2019-12-15 LAB — HEMOGLOBIN A1C
Est. average glucose Bld gHb Est-mCnc: 126 mg/dL
Hgb A1c MFr Bld: 6 % — ABNORMAL HIGH (ref 4.8–5.6)

## 2019-12-15 LAB — MULTIPLE MYELOMA PANEL, SERUM
Albumin SerPl Elph-Mcnc: 4 g/dL (ref 2.9–4.4)
Albumin/Glob SerPl: 1.5 (ref 0.7–1.7)
Alpha 1: 0.2 g/dL (ref 0.0–0.4)
Alpha2 Glob SerPl Elph-Mcnc: 0.6 g/dL (ref 0.4–1.0)
B-Globulin SerPl Elph-Mcnc: 1.2 g/dL (ref 0.7–1.3)
Gamma Glob SerPl Elph-Mcnc: 0.8 g/dL (ref 0.4–1.8)
Globulin, Total: 2.8 g/dL (ref 2.2–3.9)
IgG (Immunoglobin G), Serum: 771 mg/dL (ref 603–1613)
IgM (Immunoglobulin M), Srm: 98 mg/dL (ref 15–143)
Immunoglobulin A, (IgA) QN, Serum: 311 mg/dL (ref 61–437)

## 2019-12-15 LAB — HIV ANTIBODY (ROUTINE TESTING W REFLEX): HIV Screen 4th Generation wRfx: NONREACTIVE

## 2019-12-15 LAB — HEPATITIS B CORE ANTIBODY, TOTAL: Hep B Core Total Ab: NEGATIVE

## 2019-12-15 LAB — TSH: TSH: 1.78 u[IU]/mL (ref 0.450–4.500)

## 2019-12-15 LAB — PAN-ANCA
ANCA Proteinase 3: <3.5 U/mL (ref 0.0–3.5)
Atypical pANCA: <1:20 {titer}
C-ANCA: <1:20 {titer}
Myeloperoxidase Ab: <9 U/mL (ref 0.0–9.0)
P-ANCA: <1:20 {titer}

## 2019-12-15 LAB — VITAMIN B1: Thiamine: 155.1 nmol/L (ref 66.5–200.0)

## 2019-12-15 LAB — VITAMIN B12: Vitamin B-12: 636 pg/mL (ref 232–1245)

## 2019-12-15 LAB — C-REACTIVE PROTEIN: CRP: 1 mg/L (ref 0–10)

## 2019-12-15 LAB — HOMOCYSTEINE: Homocysteine: 16.2 umol/L (ref 0.0–19.2)

## 2019-12-15 LAB — HEPATITIS C ANTIBODY: Hep C Virus Ab: 0.2 {s_co_ratio} (ref 0.0–0.9)

## 2019-12-15 LAB — ALDOLASE: Aldolase: 4.7 U/L (ref 3.3–10.3)

## 2019-12-15 LAB — ANA W/REFLEX: ANA Titer 1: NEGATIVE

## 2019-12-15 LAB — HEPATITIS B SURFACE ANTIBODY,QUALITATIVE: Hep B Surface Ab, Qual: NONREACTIVE

## 2019-12-15 LAB — VITAMIN B6: Vitamin B6: 128.1 ug/L — ABNORMAL HIGH (ref 5.3–46.7)

## 2019-12-15 LAB — CK: Total CK: 91 U/L (ref 41–331)

## 2019-12-15 LAB — HEPATITIS B SURFACE ANTIGEN: Hepatitis B Surface Ag: NEGATIVE

## 2019-12-15 LAB — SJOGREN'S SYNDROME ANTIBODS(SSA + SSB)
ENA SSA (RO) Ab: <0.2 AI (ref 0.0–0.9)
ENA SSB (LA) Ab: <0.2 AI (ref 0.0–0.9)

## 2019-12-15 LAB — SEDIMENTATION RATE: Sed Rate: 9 mm/h (ref 0–30)

## 2019-12-21 ENCOUNTER — Telehealth: Payer: Self-pay | Admitting: *Deleted

## 2019-12-21 NOTE — Telephone Encounter (Signed)
Spoke with patient and informed his labs look good; except a1c is elevated at  6.0 and a slightly high B6. He takes a B complex vitamin daily. I advised he take maybe 2-3 x week instead of daily. I advised he discuss A1C with PCP; he has appointment soon and agreed. Patient verbalized understanding, appreciation.

## 2019-12-30 DIAGNOSIS — M47896 Other spondylosis, lumbar region: Secondary | ICD-10-CM | POA: Diagnosis not present

## 2019-12-30 DIAGNOSIS — M5416 Radiculopathy, lumbar region: Secondary | ICD-10-CM | POA: Diagnosis not present

## 2019-12-30 DIAGNOSIS — M48061 Spinal stenosis, lumbar region without neurogenic claudication: Secondary | ICD-10-CM | POA: Diagnosis not present

## 2020-01-12 DIAGNOSIS — M5416 Radiculopathy, lumbar region: Secondary | ICD-10-CM | POA: Diagnosis not present

## 2020-01-26 DIAGNOSIS — M47896 Other spondylosis, lumbar region: Secondary | ICD-10-CM | POA: Diagnosis not present

## 2020-01-26 DIAGNOSIS — M48061 Spinal stenosis, lumbar region without neurogenic claudication: Secondary | ICD-10-CM | POA: Diagnosis not present

## 2020-02-09 DIAGNOSIS — M48061 Spinal stenosis, lumbar region without neurogenic claudication: Secondary | ICD-10-CM | POA: Diagnosis not present

## 2020-02-22 DIAGNOSIS — E785 Hyperlipidemia, unspecified: Secondary | ICD-10-CM | POA: Diagnosis not present

## 2020-02-22 DIAGNOSIS — M791 Myalgia, unspecified site: Secondary | ICD-10-CM | POA: Diagnosis not present

## 2020-02-22 DIAGNOSIS — Z0000 Encounter for general adult medical examination without abnormal findings: Secondary | ICD-10-CM | POA: Diagnosis not present

## 2020-02-22 DIAGNOSIS — R7301 Impaired fasting glucose: Secondary | ICD-10-CM | POA: Diagnosis not present

## 2020-02-24 ENCOUNTER — Encounter: Payer: Medicare Other | Admitting: Diagnostic Neuroimaging

## 2020-02-24 ENCOUNTER — Ambulatory Visit (INDEPENDENT_AMBULATORY_CARE_PROVIDER_SITE_OTHER): Payer: Medicare Other | Admitting: Diagnostic Neuroimaging

## 2020-02-24 DIAGNOSIS — Z0289 Encounter for other administrative examinations: Secondary | ICD-10-CM

## 2020-02-24 DIAGNOSIS — R200 Anesthesia of skin: Secondary | ICD-10-CM

## 2020-02-24 DIAGNOSIS — R202 Paresthesia of skin: Secondary | ICD-10-CM | POA: Diagnosis not present

## 2020-02-24 NOTE — Procedures (Signed)
[facility]  NCS (NERVE CONDUCTION STUDY) WITH EMG (ELECTROMYOGRAPHY) REPORT   STUDY DATE: 02/24/20 PATIENT NAME: Scott Schneider DOB: 02-Apr-1940 MRN: [id number]  ORDERING CLINICIAN: Andrey Spearman, MD   TECHNOLOGIST: Sherre Scarlet ELECTROMYOGRAPHER: Earlean Polka. Keiden Deskin, MD  CLINICAL INFORMATION: 79 year old male with bilateral feet numbness.  FINDINGS: NERVE CONDUCTION STUDY:  Bilateral peroneal and tibial motor responses have decreased amplitudes and slow conduction velocities.  Bilateral superficial peroneal and right sural sensory response could not be obtained.  Left sural sensory response has decreased amplitude.   NEEDLE ELECTROMYOGRAPHY:  Needle examination of left lower extremity and left lumbar paraspinal muscles is normal.   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   INTERPRETING PHYSICIAN:  Penni Bombard, MD Certified in Neurology, Neurophysiology and Neuroimaging  [facility] [address], [place], [place] [address] [phone]  [facility]    Nerve / Sites Muscle Latency Ref. Amplitude Ref. Rel Amp Segments Distance Velocity Ref. Area    ms ms mV mV %  cm m/s m/s mVms  R Peroneal - EDB     Ankle EDB 5.2 ?6.5 0.3 ?2.0 100 Ankle - EDB 9   0.7     Fib head EDB 13.3  0.1  54.3 Fib head - Ankle 30 37 ?44 0.4     Pop fossa EDB 16.0  0.2  143 Pop fossa - Fib head 10 37 ?44 0.5         Pop fossa - Ankle      L Peroneal - EDB     Ankle EDB 5.7 ?6.5 0.3 ?2.0 100 Ankle - EDB 9   1.1     Fib head EDB 13.4  0.3  99.5 Fib head - Ankle 29 38 ?44 1.0     Pop fossa EDB 15.7  0.3  100 Pop fossa - Fib head 8 36 ?44 1.0         Pop fossa - Ankle      R Tibial - AH     Ankle AH 5.8 ?5.8 0.2 ?4.0 100 Ankle - AH 9   0.3     Pop fossa AH 16.3  0.2  101 Pop fossa - Ankle 38 36 ?41 0.6  L Tibial - AH     Ankle AH 5.6 ?5.8 0.8 ?4.0 100 Ankle - AH 9   2.0     Pop fossa AH 18.4  0.3   36.5 Pop fossa - Ankle 39 31 ?41 1.1             SNC    Nerve / Sites Rec. Site Peak Lat Ref.  Amp Ref. Segments Distance    ms ms V V  cm  R Sural - Ankle (Calf)     Calf Ankle NR ?4.4 NR ?6 Calf - Ankle 14  L Sural - Ankle (Calf)     Calf Ankle 3.9 ?4.4 4 ?6 Calf - Ankle 14  R Superficial peroneal - Ankle     Lat leg Ankle NR ?4.4 NR ?6 Lat leg - Ankle 14  L Superficial peroneal - Ankle     Lat leg Ankle NR ?4.4 NR ?6 Lat leg - Ankle 14             F  Wave    Nerve F Lat Ref.   ms ms  R Tibial - AH 76.1 ?56.0  L Tibial - AH 61.9 ?56.0  EMG Summary Table    Spontaneous MUAP Recruitment  Muscle IA Fib PSW Fasc Other Amp Dur. Poly Pattern  L. Lumbar paraspinals Normal None None None _______ Normal Normal Normal Normal  L. Vastus medialis Normal None None None _______ Normal Normal Normal Normal  L. Tibialis anterior Normal None None None _______ Normal Normal Normal Normal  L. Gastrocnemius (Medial head) Normal None None None _______ Normal Normal Normal Normal

## 2020-02-29 DIAGNOSIS — M5137 Other intervertebral disc degeneration, lumbosacral region: Secondary | ICD-10-CM | POA: Diagnosis not present

## 2020-02-29 DIAGNOSIS — G5791 Unspecified mononeuropathy of right lower limb: Secondary | ICD-10-CM | POA: Diagnosis not present

## 2020-02-29 DIAGNOSIS — Z0000 Encounter for general adult medical examination without abnormal findings: Secondary | ICD-10-CM | POA: Diagnosis not present

## 2020-03-01 DIAGNOSIS — M47896 Other spondylosis, lumbar region: Secondary | ICD-10-CM | POA: Diagnosis not present

## 2020-03-01 DIAGNOSIS — M48061 Spinal stenosis, lumbar region without neurogenic claudication: Secondary | ICD-10-CM | POA: Diagnosis not present

## 2020-04-24 ENCOUNTER — Inpatient Hospital Stay (HOSPITAL_COMMUNITY)
Admission: EM | Admit: 2020-04-24 | Discharge: 2020-04-26 | DRG: 280 | Disposition: A | Discharge status: Home / Self Care | Payer: Medicare (Managed Care) | Attending: Cardiology | Admitting: Cardiology

## 2020-04-24 ENCOUNTER — Emergency Department (HOSPITAL_COMMUNITY): Payer: Medicare (Managed Care)

## 2020-04-24 ENCOUNTER — Other Ambulatory Visit: Payer: Self-pay

## 2020-04-24 ENCOUNTER — Encounter (HOSPITAL_COMMUNITY): Payer: Self-pay | Admitting: Emergency Medicine

## 2020-04-24 DIAGNOSIS — G629 Polyneuropathy, unspecified: Secondary | ICD-10-CM | POA: Diagnosis present

## 2020-04-24 DIAGNOSIS — U071 COVID-19: Secondary | ICD-10-CM | POA: Diagnosis present

## 2020-04-24 DIAGNOSIS — R55 Syncope and collapse: Secondary | ICD-10-CM

## 2020-04-24 DIAGNOSIS — Z79899 Other long term (current) drug therapy: Secondary | ICD-10-CM | POA: Diagnosis not present

## 2020-04-24 DIAGNOSIS — Z8546 Personal history of malignant neoplasm of prostate: Secondary | ICD-10-CM

## 2020-04-24 DIAGNOSIS — I493 Ventricular premature depolarization: Secondary | ICD-10-CM | POA: Diagnosis present

## 2020-04-24 DIAGNOSIS — R600 Localized edema: Secondary | ICD-10-CM | POA: Diagnosis present

## 2020-04-24 DIAGNOSIS — E785 Hyperlipidemia, unspecified: Secondary | ICD-10-CM | POA: Diagnosis present

## 2020-04-24 DIAGNOSIS — W19XXXA Unspecified fall, initial encounter: Secondary | ICD-10-CM | POA: Diagnosis present

## 2020-04-24 DIAGNOSIS — M47896 Other spondylosis, lumbar region: Secondary | ICD-10-CM | POA: Diagnosis present

## 2020-04-24 DIAGNOSIS — Z923 Personal history of irradiation: Secondary | ICD-10-CM | POA: Diagnosis not present

## 2020-04-24 DIAGNOSIS — I214 Non-ST elevation (NSTEMI) myocardial infarction: Secondary | ICD-10-CM | POA: Diagnosis present

## 2020-04-24 DIAGNOSIS — R778 Other specified abnormalities of plasma proteins: Secondary | ICD-10-CM

## 2020-04-24 DIAGNOSIS — R7989 Other specified abnormal findings of blood chemistry: Secondary | ICD-10-CM

## 2020-04-24 HISTORY — DX: Non-ST elevation (NSTEMI) myocardial infarction: I21.4

## 2020-04-24 LAB — CBG MONITORING, ED: Glucose-Capillary: 95 mg/dL (ref 70–99)

## 2020-04-24 LAB — CBC
HCT: 41.9 % (ref 39.0–52.0)
Hemoglobin: 13.3 g/dL (ref 13.0–17.0)
MCH: 28.6 pg (ref 26.0–34.0)
MCHC: 31.7 g/dL (ref 30.0–36.0)
MCV: 90.1 fL (ref 80.0–100.0)
Platelets: 249 10*3/uL (ref 150–400)
RBC: 4.65 MIL/uL (ref 4.22–5.81)
RDW: 13.3 % (ref 11.5–15.5)
WBC: 8.7 10*3/uL (ref 4.0–10.5)
nRBC: 0 % (ref 0.0–0.2)

## 2020-04-24 LAB — BASIC METABOLIC PANEL WITH GFR
Anion gap: 13 (ref 5–15)
BUN: 11 mg/dL (ref 8–23)
CO2: 22 mmol/L (ref 22–32)
Calcium: 9.2 mg/dL (ref 8.9–10.3)
Chloride: 99 mmol/L (ref 98–111)
Creatinine, Ser: 1.35 mg/dL — ABNORMAL HIGH (ref 0.61–1.24)
GFR, Estimated: 54 mL/min — ABNORMAL LOW
Glucose, Bld: 123 mg/dL — ABNORMAL HIGH (ref 70–99)
Potassium: 3.8 mmol/L (ref 3.5–5.1)
Sodium: 134 mmol/L — ABNORMAL LOW (ref 135–145)

## 2020-04-24 LAB — RESP PANEL BY RT-PCR (FLU A&B, COVID) ARPGX2
Influenza A by PCR: NEGATIVE
Influenza B by PCR: NEGATIVE
SARS Coronavirus 2 by RT PCR: POSITIVE — AB

## 2020-04-24 LAB — TROPONIN I (HIGH SENSITIVITY)
Troponin I (High Sensitivity): 349 ng/L
Troponin I (High Sensitivity): 315 ng/L

## 2020-04-24 LAB — MAGNESIUM: Magnesium: 2 mg/dL (ref 1.7–2.4)

## 2020-04-24 MED ORDER — HEPARIN BOLUS VIA INFUSION
4000.0000 [IU] | Freq: Once | INTRAVENOUS | Status: AC
  Administered 2020-04-24: 4000 [IU] via INTRAVENOUS
  Filled 2020-04-24: qty 4000

## 2020-04-24 MED ORDER — NITROGLYCERIN 2 % TD OINT
1.0000 [in_us] | TOPICAL_OINTMENT | Freq: Once | TRANSDERMAL | Status: AC
  Administered 2020-04-24: 1 [in_us] via TOPICAL
  Filled 2020-04-24: qty 1

## 2020-04-24 MED ORDER — ASPIRIN 81 MG PO CHEW
324.0000 mg | CHEWABLE_TABLET | Freq: Once | ORAL | Status: AC
  Administered 2020-04-24: 324 mg via ORAL
  Filled 2020-04-24: qty 4

## 2020-04-24 MED ORDER — HEPARIN (PORCINE) 25000 UT/250ML-% IV SOLN
1150.0000 [IU]/h | INTRAVENOUS | Status: DC
  Administered 2020-04-24 – 2020-04-25 (×2): 1150 [IU]/h via INTRAVENOUS
  Filled 2020-04-24 (×2): qty 250

## 2020-04-24 NOTE — ED Provider Notes (Signed)
I saw and evaluated the patient, reviewed the resident's note and I agree with the findings and plan.  Pertinent History: This patient is a 80 year old male, he has never had any cardiac problems in the past but states that over the last several days he has had some dyspnea on exertion, it started 2 days ago, was mild, yesterday it was worse and today he actually fell while he was trying to walk into the house after becoming extremely dyspneic and feeling weak.  He was having a little bit of chest discomfort.  At this time he does not have any chest pain but does have an abnormal feeling in the left side of his neck.  Pertinent Exam findings: The patient has clear heart sounds without murmurs, clear lung sounds, no JVD, no edema.  His abdomen is soft, his oropharynx is clear.  I was personally present and directly supervised the following procedures:  Medical resuscitation Critical care NSTEMI  The patient will be given aspirin, heparin, chest x-ray, labs The patient has elevated > 350 troponin  Discussed case with Dr. Fransico Him who will have the fellow come down to admit the patient to the hospital.  8:01 PM   .Critical Care Performed by: Noemi Chapel, MD Authorized by: Noemi Chapel, MD   Critical care provider statement:    Critical care time (minutes):  35   Critical care time was exclusive of:  Separately billable procedures and treating other patients and teaching time   Critical care was necessary to treat or prevent imminent or life-threatening deterioration of the following conditions:  Cardiac failure   Critical care was time spent personally by me on the following activities:  Blood draw for specimens, development of treatment plan with patient or surrogate, discussions with consultants, evaluation of patient's response to treatment, examination of patient, obtaining history from patient or surrogate, ordering and performing treatments and interventions, ordering and review  of laboratory studies, ordering and review of radiographic studies, pulse oximetry, re-evaluation of patient's condition and review of old charts Comments:         I personally interpreted the EKG as well as the resident and agree with the interpretation on the resident's chart.  Final diagnoses:  NSTEMI (non-ST elevated myocardial infarction) ([place])      Noemi Chapel, MD 04/25/20 1453

## 2020-04-24 NOTE — ED Provider Notes (Signed)
[facility] EMERGENCY DEPARTMENT Provider Note   CSN: [id number] Arrival date & time: 04/24/20  1530     History Chief Complaint  Patient presents with  . Shortness of Breath  . Chest Pain    Scott Schneider is a 80 y.o. male with h/o prostate CA who presents to the ED with daughter for SOB, fatigue, and chest pain. Patient developed DOE and fatigue 3 days associated with exertion. This AM, patient awoke with L-sided chest discomfort. While walking back from the mail box, patient had to stop and lower himself to the ground after becoming SOB, weak, and fatigued. Symptoms better with rest and worse with exertion. No previous cardiac disease. Denies fever, chills, cough, N/V/D, or abdominal pain.  The history is provided by the patient and medical records.  Shortness of Breath Severity:  Moderate Onset quality:  Gradual Duration:  3 days Timing:  Intermittent Progression:  Worsening Chronicity:  New Context: activity   Relieved by:  Rest Worsened by:  Exertion Associated symptoms: chest pain   Associated symptoms: no abdominal pain, no cough, no ear pain, no fever, no hemoptysis, no rash, no sore throat, no sputum production and no vomiting   Risk factors: no hx of PE/DVT, no obesity and no tobacco use        Past Medical History:  Diagnosis Date  . H/O back injury 1980's  . Low back pain   . Other spondylosis, lumbar region   . Paresthesia of skin   . Prostate cancer ([place])   . Radiculopathy   . Spinal stenosis of lumbar region     Patient Active Problem List   Diagnosis Date Noted  . NSTEMI (non-ST elevated myocardial infarction) ([place]) 04/24/2020  . Malignant neoplasm of prostate ([place]) 12/18/2015    Past Surgical History:  Procedure Laterality Date  . PROSTATE BIOPSY    . TONSILLECTOMY     early 20's       Family History  Problem Relation Age of Onset  . Cancer Mother        breast  . Cancer Maternal Grandmother        uterine  .  Cancer Son        kidney cancer/excised  . Cirrhosis Father        liver  . Asthma Sister   . Other Brother        AIDS    Social History   Tobacco Use  . Smoking status: Never Smoker  . Smokeless tobacco: Never Used  Substance Use Topics  . Alcohol use: No  . Drug use: No    Home Medications Prior to Admission medications   Medication Sig Start Date End Date Taking? Authorizing Provider  acetaminophen (TYLENOL) 325 MG tablet Take 650 mg by mouth every 6 (six) hours as needed for mild pain.   Yes [provider]  brimonidine (ALPHAGAN) 0.2 % ophthalmic solution Place 1 drop into the right eye 2 (two) times daily. 11/03/19  Yes [provider]  gabapentin (NEURONTIN) 100 MG capsule Take 200 mg by mouth at bedtime. Take 1-2 every night   Yes [provider]  tamsulosin (FLOMAX) 0.4 MG CAPS capsule Take 0.4 mg by mouth daily.   Yes [provider]  VITAMIN D PO Take 1 tablet by mouth daily.   Yes [provider]    Allergies    Patient has no known allergies.  Review of Systems   Review of Systems  Constitutional: Positive for  fatigue. Negative for chills and fever.  HENT: Negative for ear pain and sore throat.   Eyes: Negative for pain and visual disturbance.  Respiratory: Positive for shortness of breath. Negative for cough, hemoptysis and sputum production.   Cardiovascular: Positive for chest pain. Negative for palpitations.  Gastrointestinal: Negative for abdominal pain, diarrhea, nausea and vomiting.  Genitourinary: Negative for dysuria and hematuria.  Musculoskeletal: Negative for arthralgias and back pain.  Skin: Negative for color change and rash.  Neurological: Positive for weakness. Negative for seizures and syncope.  All other systems reviewed and are negative.   Physical Exam Updated Vital Signs BP 127/63   Pulse (!) 32   Temp 97.7 F (36.5 C) (Oral)   Resp 17   Ht 5\' 10"  (1.778 m)   Wt 90.7 kg   SpO2 100%    BMI 28.70 kg/m   Physical Exam Vitals and nursing note reviewed.  Constitutional:      General: He is awake. He is not in acute distress.    Appearance: Normal appearance. He is well-developed, well-groomed and well-nourished. He is not ill-appearing.  HENT:     Head: Normocephalic and atraumatic.     Right Ear: External ear normal.     Left Ear: External ear normal.     Nose: Nose normal.  Eyes:     General: No scleral icterus.       Right eye: No discharge.        Left eye: No discharge.     Conjunctiva/sclera: Conjunctivae normal.  Cardiovascular:     Rate and Rhythm: Normal rate and regular rhythm.     Pulses: Normal pulses.          Radial pulses are 2+ on the right side and 2+ on the left side.       Dorsalis pedis pulses are 2+ on the right side and 2+ on the left side.     Heart sounds: Normal heart sounds. No murmur heard.   Pulmonary:     Effort: Pulmonary effort is normal. No respiratory distress.     Breath sounds: Normal breath sounds. No wheezing, rhonchi or rales.  Chest:     Chest wall: No tenderness.  Abdominal:     General: Abdomen is flat. There is no distension.     Palpations: Abdomen is soft.     Tenderness: There is no abdominal tenderness. There is no guarding or rebound.  Musculoskeletal:        General: No edema.     Cervical back: Neck supple.  Skin:    General: Skin is warm and dry.     Findings: No rash.  Neurological:     General: No focal deficit present.     Mental Status: He is alert and oriented to person, place, and time.  Psychiatric:        Mood and Affect: Mood and affect and mood normal.        Behavior: Behavior normal. Behavior is cooperative.     ED Results / Procedures / Treatments   Labs (all labs ordered are listed, but only abnormal results are displayed) Labs Reviewed  RESP PANEL BY RT-PCR (FLU A&B, COVID) ARPGX2 - Abnormal; Notable for the following components:      Result Value   SARS Coronavirus 2 by RT PCR  POSITIVE (*)    All other components within normal limits  BASIC METABOLIC PANEL - Abnormal; Notable for the following components:   Sodium 134 (*)  Glucose, Bld 123 (*)    Creatinine, Ser 1.35 (*)    GFR, Estimated 54 (*)    All other components within normal limits  TROPONIN I (HIGH SENSITIVITY) - Abnormal; Notable for the following components:   Troponin I (High Sensitivity) 349 (*)    All other components within normal limits  TROPONIN I (HIGH SENSITIVITY) - Abnormal; Notable for the following components:   Troponin I (High Sensitivity) 315 (*)    All other components within normal limits  CBC  MAGNESIUM  HEPARIN LEVEL (UNFRACTIONATED)  CBC  CBG MONITORING, ED    EKG EKG Interpretation  Date/Time:  Monday April 24 2020 18:01:09 EST Ventricular Rate:  88 PR Interval:  194 QRS Duration: 105 QT Interval:  385 QTC Calculation: 466 R Axis:   132 Text Interpretation: Sinus rhythm Multiform ventricular premature complexes Borderline prolonged PR interval Left posterior fascicular block Nonspecific T abnormalities, anterior leads Confirmed by Noemi Chapel [phone]) on 04/24/2020 6:26:06 PM   Radiology DG Chest 2 View  Result Date: 04/24/2020 CLINICAL DATA:  Dyspnea on exertion, left chest and arm pain, fell EXAM: CHEST - 2 VIEW COMPARISON:  None FINDINGS: The heart size and mediastinal contours are within normal limits. Both lungs are clear. The visualized skeletal structures are unremarkable. IMPRESSION: No active cardiopulmonary disease. Electronically Signed   By: Randa Ngo M.D.   On: 04/24/2020 16:35    Procedures .Critical Care Performed by: Christy Gentles, MD Authorized by: Noemi Chapel, MD   Critical care provider statement:    Critical care time (minutes):  35   Critical care start time:  04/24/2020 6:00 PM   Critical care end time:  04/24/2020 6:30 PM   Critical care was necessary to treat or prevent imminent or life-threatening deterioration of the  following conditions:  Cardiac failure   Critical care was time spent personally by me on the following activities:  Development of treatment plan with patient or surrogate, discussions with consultants, examination of patient, evaluation of patient's response to treatment, obtaining history from patient or surrogate, ordering and performing treatments and interventions, ordering and review of laboratory studies, ordering and review of radiographic studies, re-evaluation of patient's condition and review of old charts   Care discussed with: admitting provider      Medications Ordered in ED Medications  heparin ADULT infusion 100 units/mL (25000 units/277mL) (1,150 Units/hr Intravenous New Bag/Given 04/24/20 1936)  aspirin chewable tablet 324 mg (324 mg Oral Given 04/24/20 1851)  nitroGLYCERIN (NITROGLYN) 2 % ointment 1 inch (1 inch Topical Given 04/24/20 1853)  heparin bolus via infusion 4,000 Units (4,000 Units Intravenous Bolus from Bag 04/24/20 1937)    ED Course  I have reviewed the triage vital signs and the nursing notes.  Pertinent labs & imaging results that were available during my care of the patient were reviewed by me and considered in my medical decision making (see chart for details).    MDM Rules/Calculators/A&P                          Patient is a 80yoM with history and physical as described above who presents to the ED for SOB, fatigue, and chest pain. VS reassuring and HDS. Patient resting comfortably in no acute distress. Initial diagnostic workup started in triage and notable for troponin 315. No acute ischemic changes on ECG. Given exertional component of symptoms and elevated troponin without acute ischemic changes on ECG, high suspicion for NSTEMI and patient given  loading dose ASA, NTG paste, and started on heparin infusion. Discussed patient with Cardiology who evaluated patient in ED and will admit to their service for further care and management. Patient remained HDS  with no further acute events under my care.  Final Clinical Impression(s) / ED Diagnoses Final diagnoses:  NSTEMI (non-ST elevated myocardial infarction) [facility])    Rx / [place] Orders ED Discharge Orders    None       Christy Gentles, MD 04/25/20 [id number]    Noemi Chapel, MD 04/25/20 1453

## 2020-04-24 NOTE — ED Provider Notes (Incomplete)
[facility] EMERGENCY DEPARTMENT Provider Note   CSN: [id number] Arrival date & time: 04/24/20  1530     History Chief Complaint  Patient presents with  . Shortness of Breath  . Chest Pain    Scott Schneider is a 80 y.o. male with h/o prostate CA who presents to the ED with daughter for SOB, fatigue, and chest pain. Patient developed DOE and fatigue 3 days associated with exertion. This AM, patient awoke with L-sided chest discomfort. While walking back from the mail box, patient had to stop and lower himself to the ground after becoming SOB, weak, and fatigued. Symptoms better with rest and worse with exertion.  The history is provided by the patient and medical records.  Shortness of Breath Severity:  Moderate Onset quality:  Gradual Duration:  3 days Timing:  Intermittent Progression:  Worsening Chronicity:  New Context: activity   Relieved by:  Rest Worsened by:  Exertion Associated symptoms: no hemoptysis and no sputum production   Risk factors: no hx of PE/DVT, no obesity and no tobacco use        Past Medical History:  Diagnosis Date  . H/O back injury 1980's  . Low back pain   . Other spondylosis, lumbar region   . Paresthesia of skin   . Prostate cancer ([place])   . Radiculopathy   . Spinal stenosis of lumbar region     Patient Active Problem List   Diagnosis Date Noted  . Malignant neoplasm of prostate ([place]) 12/18/2015    Past Surgical History:  Procedure Laterality Date  . PROSTATE BIOPSY    . TONSILLECTOMY     early 20's       Family History  Problem Relation Age of Onset  . Cancer Mother        breast  . Cancer Maternal Grandmother        uterine  . Cancer Son        kidney cancer/excised  . Cirrhosis Father        liver  . Asthma Sister   . Other Brother        AIDS    Social History   Tobacco Use  . Smoking status: Never Smoker  . Smokeless tobacco: Never Used  Substance Use Topics  . Alcohol use: No  . Drug  use: No    Home Medications Prior to Admission medications   Medication Sig Start Date End Date Taking? Authorizing Provider  acetaminophen (TYLENOL) 325 MG tablet Take 650 mg by mouth every 6 (six) hours as needed.    [provider]  b complex vitamins capsule Take 1 capsule by mouth daily.    [provider]  brimonidine (ALPHAGAN) 0.2 % ophthalmic solution Place 1 drop into the right eye 2 (two) times daily. 11/03/19   [provider]  Colchicine 0.6 MG CAPS Take by mouth. Patient not taking: Reported on 12/08/2019 11/25/19   [provider]  gabapentin (NEURONTIN) 100 MG capsule Take 100 mg by mouth. Take 1-2 every night    [provider]  Multiple Vitamins-Minerals (MULTIVITAMIN ADULT PO) Take by mouth.    [provider]  tamsulosin (FLOMAX) 0.4 MG CAPS capsule Take 0.4 mg by mouth daily.    [provider]    Allergies    Patient has no known allergies.  Review of Systems   Review of Systems  Respiratory: Negative for hemoptysis and sputum production.     Physical Exam Updated Vital Signs  BP (!) 141/69 (BP Location: Right Arm)   Pulse 89   Temp 97.7 F (36.5 C) (Oral)   Resp 18   Ht 5\' 10"  (1.778 m)   Wt 90.7 kg   SpO2 100%   BMI 28.70 kg/m   Physical Exam  ED Results / Procedures / Treatments   Labs (all labs ordered are listed, but only abnormal results are displayed) Labs Reviewed  BASIC METABOLIC PANEL - Abnormal; Notable for the following components:      Result Value   Sodium 134 (*)    Glucose, Bld 123 (*)    Creatinine, Ser 1.35 (*)    GFR, Estimated 54 (*)    All other components within normal limits  TROPONIN I (HIGH SENSITIVITY) - Abnormal; Notable for the following components:   Troponin I (High Sensitivity) 349 (*)    All other components within normal limits  CBC  TROPONIN I (HIGH SENSITIVITY)    EKG EKG Interpretation  Date/Time:  Monday April 24 2020 15:44:40  EST Ventricular Rate:  91 PR Interval:  194 QRS Duration: 98 QT Interval:  372 QTC Calculation: 457 R Axis:   -92 Text Interpretation: Sinus rhythm with Premature supraventricular complexes and with occasional Premature ventricular complexes Right superior axis deviation ST & T wave abnormality, consider anterolateral ischemia Abnormal ECG no obvious ST elevation Confirmed by Noemi Chapel [phone]) on 04/24/2020 5:22:44 PM   Radiology DG Chest 2 View  Result Date: 04/24/2020 CLINICAL DATA:  Dyspnea on exertion, left chest and arm pain, fell EXAM: CHEST - 2 VIEW COMPARISON:  None FINDINGS: The heart size and mediastinal contours are within normal limits. Both lungs are clear. The visualized skeletal structures are unremarkable. IMPRESSION: No active cardiopulmonary disease. Electronically Signed   By: Randa Ngo M.D.   On: 04/24/2020 16:35    Procedures Procedures {Remember to document critical care time when appropriate:1}  Medications Ordered in ED Medications - No data to display  ED Course  I have reviewed the triage vital signs and the nursing notes.  Pertinent labs & imaging results that were available during my care of the patient were reviewed by me and considered in my medical decision making (see chart for details).    MDM Rules/Calculators/A&P                          *** Final Clinical Impression(s) / ED Diagnoses Final diagnoses:  None    Rx / DC Orders ED Discharge Orders    None

## 2020-04-24 NOTE — ED Triage Notes (Signed)
Patient coming from home. States he was walking from mailbox and became short of breath and experienced left sided chest pain. VSS. NAD.

## 2020-04-24 NOTE — H&P (Signed)
Cardiology Admission History and Physical:   Patient ID: BARUC TUGWELL MRN: [id number]; DOB: 1940-11-16   Admission date: 04/24/2020  PCP:  Janie Morning, [place]  Cardiologist:  No primary care provider on file.   Advanced Practice Provider:  No care team member to display Electrophysiologist:  None  [id number]    Chief Complaint:  Syncope  Patient Profile:   Scott Schneider is a 80 y.o. male with PMH significant for prostate cancer s/p radiation and polyneuropathy who presents with progressive dyspnea on exertion and syncope.  History of Present Illness:   Mr. Rowand was in his usual state of health until 2/12 when he started to notice weakness and shortness of breath with exertion. He states that he relaxed most of the day and felt well while resting. He had recurrence of his symptoms on 2/13 when he tried to exert himself and notes that his shortness of breath was worse. This morning, he tried to walk out and get the mail but developed significant lightheadedness, shortness of breath and mild chest pain with exertion. He notes that the chest pain was worse with palpation of his left chest. He rested against his house for a bit and then continued to walk into the house. Unfortunately, he had a syncopal episode. He does not feel that he was out for long. When he got into the house, he called his PCP who advised him to come to the ER. He has not had any recurrence of his symptoms while resting in the ER. He denies any current chest pain, shortness of breath, palpitations, lightheadedness, dizziness, lower leg swelling, orthopnea or PND. He has not significant family history of cardiac problems.    Past Medical History:  Diagnosis Date  . H/O back injury 1980's  . Low back pain   . Other spondylosis, lumbar region   . Paresthesia of skin   . Prostate cancer ([place])   . Radiculopathy   . Spinal stenosis of lumbar region     Past Surgical History:   Procedure Laterality Date  . PROSTATE BIOPSY    . TONSILLECTOMY     early 20's     Medications Prior to Admission: Prior to Admission medications   Medication Sig Start Date End Date Taking? Authorizing Provider  acetaminophen (TYLENOL) 325 MG tablet Take 650 mg by mouth every 6 (six) hours as needed for mild pain.   Yes [provider]  brimonidine (ALPHAGAN) 0.2 % ophthalmic solution Place 1 drop into the right eye 2 (two) times daily. 11/03/19  Yes [provider]  gabapentin (NEURONTIN) 100 MG capsule Take 200 mg by mouth at bedtime. Take 1-2 every night   Yes [provider]  tamsulosin (FLOMAX) 0.4 MG CAPS capsule Take 0.4 mg by mouth daily.   Yes [provider]  VITAMIN D PO Take 1 tablet by mouth daily.   Yes [provider]     Allergies:   No Known Allergies  Social History:   Social History   Socioeconomic History  . Marital status: Widowed    Spouse name: Not on file  . Number of children: 4  . Years of education: Not on file  . Highest education level: Bachelor's degree (e.g., BA, AB, BS)  Occupational History    Comment: retired  Tobacco Use  . Smoking status: Never Smoker  . Smokeless tobacco: Never Used  Substance and Sexual Activity  . Alcohol use: No  . Drug use:  No  . Sexual activity: Yes  Other Topics Concern  . Not on file  Social History Narrative   Lives alone   Social Determinants of Health   Financial Resource Strain: Not on file  Food Insecurity: Not on file  Transportation Needs: Not on file  Physical Activity: Not on file  Stress: Not on file  Social Connections: Not on file  Intimate Partner Violence: Not on file    Family History:   The patient's family history includes Asthma in his sister; Cancer in his maternal grandmother, mother, and son; Cirrhosis in his father; Other in his brother.    ROS:  Please see the history of present illness.  All other ROS reviewed and negative.      Physical Exam/Data:   Vitals:   04/24/20 1745 04/24/20 1830 04/24/20 1845 04/24/20 1930  BP: 122/79 118/86 136/74 (!) 142/93  Pulse: 88 84 77 81  Resp: (!) 21 19 16 17   Temp:      TempSrc:      SpO2: 100% 100% 98% 100%  Weight:      Height:       No intake or output data in the 24 hours ending 04/24/20 2109 Last 3 Weights 04/24/2020 12/08/2019 05/14/2016  Weight (lbs) 200 lb 205 lb 9.6 oz 206 lb 6.4 oz  Weight (kg) 90.719 kg 93.26 kg 93.622 kg     Body mass index is 28.7 kg/m.  General:  Well nourished, well developed, in no acute distress  HEENT: normal Neck: no JVD Vascular: No carotid bruits; FA pulses 2+ bilaterally without bruits  Cardiac:  normal S1, S2; RRR; no murmurs Lungs:  clear to auscultation bilaterally, no wheezing, rhonchi or rales  Abd: soft, nontender, no hepatomegaly  Ext: Trace lower extremity edema of the right leg  Skin: warm and dry  Neuro:  CNs 2-12 intact, no focal abnormalities noted Psych:  Normal affect    EKG:  The ECG that was done was personally reviewed and demonstrates sinus rhythm with occasional PVCs, LAFB, RBBB and T wave inversions anterolaterally   Relevant CV Studies: None  Laboratory Data:  High Sensitivity Troponin:   Recent Labs  Lab 04/24/20 1542 04/24/20 1845  TROPONINIHS 349* 315*      Chemistry Recent Labs  Lab 04/24/20 1542  NA 134*  K 3.8  CL 99  CO2 22  GLUCOSE 123*  BUN 11  CREATININE 1.35*  CALCIUM 9.2  GFRNONAA 54*  ANIONGAP 13    No results for input(s): PROT, ALBUMIN, AST, ALT, ALKPHOS, BILITOT in the last 168 hours. Hematology Recent Labs  Lab 04/24/20 1542  WBC 8.7  RBC 4.65  HGB 13.3  HCT 41.9  MCV 90.1  MCH 28.6  MCHC 31.7  RDW 13.3  PLT 249   BNPNo results for input(s): BNP, PROBNP in the last 168 hours.  DDimer No results for input(s): DDIMER in the last 168 hours.   Radiology/Studies:  DG Chest 2 View  Result Date: 04/24/2020 CLINICAL DATA:  Dyspnea on exertion, left chest  and arm pain, fell EXAM: CHEST - 2 VIEW COMPARISON:  None FINDINGS: The heart size and mediastinal contours are within normal limits. Both lungs are clear. The visualized skeletal structures are unremarkable. IMPRESSION: No active cardiopulmonary disease. Electronically Signed   By: Randa Ngo M.D.   On: 04/24/2020 16:35     Assessment and Plan:   1. NSTEMI- Presents with syncope, dyspnea on exertion and mild chest pressure that has been progressive in  nature. Troponin elevated consistent with NSTEMI. No known significant risk factors. Given his troponin elevation, he should undergo LHC to assess for underlying coronary disease.  - Check lipid panel and TSH (recent HgA1c 6) - Heparin drip, ACS nomogram - S/p aspirin 324mg   - Start aspirin 81mg  daily - Start lipitor 40mg  - Start metoprolol 12.5mg  BID  - NPO at midnight for LHC  - Echo ordered - Monitor on tele   2. History of prostate cancer - Continue home flomax  3. Polyneuropathy - Continue home nightly gabapentin    Risk Assessment/Risk Scores:     TIMI Risk Score for Unstable Angina or Non-ST Elevation MI:   The patient's TIMI risk score is 3, which indicates a 13% risk of all cause mortality, new or recurrent myocardial infarction or need for urgent revascularization in the next 14 days.{  Severity of Illness: The appropriate patient status for this patient is INPATIENT. Inpatient status is judged to be reasonable and necessary in order to provide the required intensity of service to ensure the patient's safety. The patient's presenting symptoms, physical exam findings, and initial radiographic and laboratory data in the context of their chronic comorbidities is felt to place them at high risk for further clinical deterioration. Furthermore, it is not anticipated that the patient will be medically stable for discharge from the hospital within 2 midnights of admission. The following factors support the patient status of  inpatient.   " The patient's presenting symptoms include chest pain and shortness of breath. " The worrisome physical exam findings include PVCs. " The initial radiographic and laboratory data are worrisome because of elevated troponin. " The chronic co-morbidities include prostate cancer.   * I certify that at the point of admission it is my clinical judgment that the patient will require inpatient hospital care spanning beyond 2 midnights from the point of admission due to high intensity of service, high risk for further deterioration and high frequency of surveillance required.*    For questions or updates, please contact [facility] Please consult [email or web address] for contact info under     Signed, Princella Pellegrini, MD  04/24/2020 9:09 PM

## 2020-04-24 NOTE — Progress Notes (Signed)
ANTICOAGULATION CONSULT NOTE - Initial Consult  Pharmacy Consult for heparin Indication: chest pain/ACS  No Known Allergies  Patient Measurements: Height: 5\' 10"  (177.8 cm) Weight: 90.7 kg (200 lb) IBW/kg (Calculated) : 73 Heparin Dosing Weight: 90.7 kg   Vital Signs: Temp: 97.7 F (36.5 C) (02/14 1540) Temp Source: Oral (02/14 1540) BP: 136/74 (02/14 1845) Pulse Rate: 77 (02/14 1845)  Labs: Recent Labs    04/24/20 1542  HGB 13.3  HCT 41.9  PLT 249  CREATININE 1.35*  TROPONINIHS 349*    Estimated Creatinine Clearance: 51.1 mL/min (A) (by C-G formula based on SCr of 1.35 mg/dL (H)).   Medical History: Past Medical History:  Diagnosis Date  . H/O back injury 1980's  . Low back pain   . Other spondylosis, lumbar region   . Paresthesia of skin   . Prostate cancer ([place])   . Radiculopathy   . Spinal stenosis of lumbar region     Medications:  (Not in a hospital admission)   Assessment: 89 YOM who presents with dyspnea on exertion. Troponin elevated. Pharmacy consulted to start IV heparin for ACS. H/H and Plt wnl. SCr 1.35   Goal of Therapy:  Heparin level 0.3-0.7 units/ml Monitor platelets by anticoagulation protocol: Yes   Plan:  -Heparin 4000 units IV bolus followed by heparin infusion at 1150 units/hr -F/u 8 hr HL -Monitor daily HL, CBC and s/s of bleeding   Albertina Parr, PharmD., BCPS, BCCCP Clinical Pharmacist Please refer to [facility] for unit-specific pharmacist

## 2020-04-25 ENCOUNTER — Other Ambulatory Visit (HOSPITAL_COMMUNITY): Payer: Medicare (Managed Care)

## 2020-04-25 ENCOUNTER — Encounter (HOSPITAL_COMMUNITY)
Admission: EM | Disposition: A | Discharge status: Home / Self Care | Payer: Self-pay | Source: Home / Self Care | Attending: Internal Medicine

## 2020-04-25 ENCOUNTER — Encounter (HOSPITAL_COMMUNITY): Payer: Self-pay | Admitting: Internal Medicine

## 2020-04-25 DIAGNOSIS — I214 Non-ST elevation (NSTEMI) myocardial infarction: Secondary | ICD-10-CM | POA: Diagnosis not present

## 2020-04-25 DIAGNOSIS — R7989 Other specified abnormal findings of blood chemistry: Secondary | ICD-10-CM

## 2020-04-25 DIAGNOSIS — U071 COVID-19: Secondary | ICD-10-CM | POA: Diagnosis not present

## 2020-04-25 DIAGNOSIS — E785 Hyperlipidemia, unspecified: Secondary | ICD-10-CM | POA: Diagnosis not present

## 2020-04-25 DIAGNOSIS — R778 Other specified abnormalities of plasma proteins: Secondary | ICD-10-CM

## 2020-04-25 HISTORY — PX: LEFT HEART CATH AND CORONARY ANGIOGRAPHY: CATH118249

## 2020-04-25 LAB — CBC
HCT: 38.6 % — ABNORMAL LOW (ref 39.0–52.0)
Hemoglobin: 12.5 g/dL — ABNORMAL LOW (ref 13.0–17.0)
MCH: 29.2 pg (ref 26.0–34.0)
MCHC: 32.4 g/dL (ref 30.0–36.0)
MCV: 90.2 fL (ref 80.0–100.0)
Platelets: 217 K/uL (ref 150–400)
RBC: 4.28 MIL/uL (ref 4.22–5.81)
RDW: 13.5 % (ref 11.5–15.5)
WBC: 6.7 K/uL (ref 4.0–10.5)
nRBC: 0 % (ref 0.0–0.2)

## 2020-04-25 LAB — LIPID PANEL
Cholesterol: 202 mg/dL — ABNORMAL HIGH (ref 0–200)
HDL: 44 mg/dL
LDL Cholesterol: 139 mg/dL — ABNORMAL HIGH (ref 0–99)
Total CHOL/HDL Ratio: 4.6 ratio
Triglycerides: 95 mg/dL
VLDL: 19 mg/dL (ref 0–40)

## 2020-04-25 LAB — HEPARIN LEVEL (UNFRACTIONATED): Heparin Unfractionated: 0.37 [IU]/mL (ref 0.30–0.70)

## 2020-04-25 LAB — BASIC METABOLIC PANEL WITH GFR
Anion gap: 9 (ref 5–15)
BUN: 10 mg/dL (ref 8–23)
CO2: 24 mmol/L (ref 22–32)
Calcium: 9.2 mg/dL (ref 8.9–10.3)
Chloride: 105 mmol/L (ref 98–111)
Creatinine, Ser: 1.06 mg/dL (ref 0.61–1.24)
GFR, Estimated: >60 mL/min
Glucose, Bld: 97 mg/dL (ref 70–99)
Potassium: 4.1 mmol/L (ref 3.5–5.1)
Sodium: 138 mmol/L (ref 135–145)

## 2020-04-25 LAB — TSH: TSH: 1.745 u[IU]/mL (ref 0.350–4.500)

## 2020-04-25 LAB — BRAIN NATRIURETIC PEPTIDE: B Natriuretic Peptide: 366.5 pg/mL — ABNORMAL HIGH (ref 0.0–100.0)

## 2020-04-25 SURGERY — LEFT HEART CATH AND CORONARY ANGIOGRAPHY
Anesthesia: LOCAL

## 2020-04-25 MED ORDER — MIDAZOLAM HCL 2 MG/2ML IJ SOLN
INTRAMUSCULAR | Status: AC
  Filled 2020-04-25: qty 2

## 2020-04-25 MED ORDER — ACETAMINOPHEN 325 MG PO TABS: 650.0000 mg | ORAL_TABLET | ORAL | Status: DC | PRN

## 2020-04-25 MED ORDER — HEPARIN (PORCINE) IN NACL 1000-0.9 UT/500ML-% IV SOLN
INTRAVENOUS | Status: AC
  Filled 2020-04-25: qty 500

## 2020-04-25 MED ORDER — FENTANYL CITRATE (PF) 100 MCG/2ML IJ SOLN
INTRAMUSCULAR | Status: AC
  Filled 2020-04-25: qty 2

## 2020-04-25 MED ORDER — HEPARIN (PORCINE) IN NACL 1000-0.9 UT/500ML-% IV SOLN
INTRAVENOUS | Status: DC | PRN
  Administered 2020-04-25 (×2): 500 mL

## 2020-04-25 MED ORDER — NITROGLYCERIN 0.4 MG SL SUBL: 0.4000 mg | SUBLINGUAL_TABLET | SUBLINGUAL | Status: DC | PRN

## 2020-04-25 MED ORDER — LIDOCAINE HCL (PF) 1 % IJ SOLN
INTRAMUSCULAR | Status: AC
  Filled 2020-04-25: qty 30

## 2020-04-25 MED ORDER — ASPIRIN EC 81 MG PO TBEC
81.0000 mg | DELAYED_RELEASE_TABLET | Freq: Every day | ORAL | Status: DC
  Administered 2020-04-25 – 2020-04-26 (×2): 81 mg via ORAL
  Filled 2020-04-25 (×2): qty 1

## 2020-04-25 MED ORDER — BRIMONIDINE TARTRATE 0.2 % OP SOLN
1.0000 [drp] | Freq: Two times a day (BID) | OPHTHALMIC | Status: DC
  Administered 2020-04-25 – 2020-04-26 (×3): 1 [drp] via OPHTHALMIC
  Filled 2020-04-25 (×2): qty 5

## 2020-04-25 MED ORDER — MIDAZOLAM HCL 2 MG/2ML IJ SOLN
INTRAMUSCULAR | Status: DC | PRN
  Administered 2020-04-25: 1 mg via INTRAVENOUS

## 2020-04-25 MED ORDER — SODIUM CHLORIDE 0.9 % WEIGHT BASED INFUSION
1.0000 mL/kg/h | INTRAVENOUS | Status: AC
  Administered 2020-04-25: 1 mL/kg/h via INTRAVENOUS

## 2020-04-25 MED ORDER — GABAPENTIN 100 MG PO CAPS
200.0000 mg | ORAL_CAPSULE | Freq: Every day | ORAL | Status: DC
  Administered 2020-04-25: 200 mg via ORAL
  Filled 2020-04-25: qty 2

## 2020-04-25 MED ORDER — SODIUM CHLORIDE 0.9% FLUSH: 3.0000 mL | INTRAVENOUS | Status: DC | PRN

## 2020-04-25 MED ORDER — METOPROLOL TARTRATE 12.5 MG HALF TABLET
12.5000 mg | ORAL_TABLET | Freq: Two times a day (BID) | ORAL | Status: DC
  Administered 2020-04-25 – 2020-04-26 (×4): 12.5 mg via ORAL
  Filled 2020-04-25 (×4): qty 1

## 2020-04-25 MED ORDER — TAMSULOSIN HCL 0.4 MG PO CAPS
0.4000 mg | ORAL_CAPSULE | Freq: Every day | ORAL | Status: DC
  Administered 2020-04-25 – 2020-04-26 (×2): 0.4 mg via ORAL
  Filled 2020-04-25 (×2): qty 1

## 2020-04-25 MED ORDER — SODIUM CHLORIDE 0.9 % WEIGHT BASED INFUSION: 1.0000 mL/kg/h | INTRAVENOUS | Status: DC

## 2020-04-25 MED ORDER — VERAPAMIL HCL 2.5 MG/ML IV SOLN
INTRAVENOUS | Status: AC
  Filled 2020-04-25: qty 2

## 2020-04-25 MED ORDER — SODIUM CHLORIDE 0.9 % WEIGHT BASED INFUSION: 3.0000 mL/kg/h | INTRAVENOUS | Status: AC

## 2020-04-25 MED ORDER — VERAPAMIL HCL 2.5 MG/ML IV SOLN
INTRAVENOUS | Status: DC | PRN
  Administered 2020-04-25: 10 mL via INTRA_ARTERIAL

## 2020-04-25 MED ORDER — SODIUM CHLORIDE 0.9 % IV SOLN: 250.0000 mL | INTRAVENOUS | Status: DC | PRN

## 2020-04-25 MED ORDER — LIDOCAINE HCL (PF) 1 % IJ SOLN
INTRAMUSCULAR | Status: DC | PRN
  Administered 2020-04-25: 2 mL

## 2020-04-25 MED ORDER — ONDANSETRON HCL 4 MG/2ML IJ SOLN: 4.0000 mg | Freq: Four times a day (QID) | INTRAMUSCULAR | Status: DC | PRN

## 2020-04-25 MED ORDER — SODIUM CHLORIDE 0.9% FLUSH
3.0000 mL | Freq: Two times a day (BID) | INTRAVENOUS | Status: DC
  Administered 2020-04-25 – 2020-04-26 (×2): 3 mL via INTRAVENOUS

## 2020-04-25 MED ORDER — IOHEXOL 350 MG/ML SOLN
INTRAVENOUS | Status: DC | PRN
  Administered 2020-04-25: 50 mL via INTRA_ARTERIAL

## 2020-04-25 MED ORDER — HEPARIN SODIUM (PORCINE) 5000 UNIT/ML IJ SOLN
5000.0000 [IU] | Freq: Three times a day (TID) | INTRAMUSCULAR | Status: DC
  Administered 2020-04-26 (×2): 5000 [IU] via SUBCUTANEOUS
  Filled 2020-04-25 (×2): qty 1

## 2020-04-25 MED ORDER — FENTANYL CITRATE (PF) 100 MCG/2ML IJ SOLN
INTRAMUSCULAR | Status: DC | PRN
  Administered 2020-04-25: 25 ug via INTRAVENOUS

## 2020-04-25 MED ORDER — ATORVASTATIN CALCIUM 40 MG PO TABS
40.0000 mg | ORAL_TABLET | Freq: Every day | ORAL | Status: DC
  Administered 2020-04-25 – 2020-04-26 (×2): 40 mg via ORAL
  Filled 2020-04-25 (×2): qty 1

## 2020-04-25 MED ORDER — HEPARIN SODIUM (PORCINE) 1000 UNIT/ML IJ SOLN
INTRAMUSCULAR | Status: DC | PRN
  Administered 2020-04-25: 4500 [IU] via INTRAVENOUS

## 2020-04-25 SURGICAL SUPPLY — 10 items
BAG SNAP BAND KOVER 36X36 (MISCELLANEOUS) ×2
CATH 5FR JL3.5 JR4 ANG PIG MP (CATHETERS) ×2
COVER DOME SNAP 22 D (MISCELLANEOUS) ×2
DEVICE RAD COMP TR BAND LRG (VASCULAR PRODUCTS) ×2
GLIDESHEATH SLEND SS 6F .021 (SHEATH) ×2
INQWIRE 1.5J .035X260CM (WIRE) ×2
KIT HEART LEFT (KITS) ×2
PACK CARDIAC CATHETERIZATION (CUSTOM PROCEDURE TRAY) ×2
TRANSDUCER W/STOPCOCK (MISCELLANEOUS) ×2
TUBING CIL FLEX 10 FLL-RA (TUBING) ×2

## 2020-04-25 NOTE — Progress Notes (Addendum)
Progress Note  Patient Name: Scott Schneider Date of Encounter: 04/25/2020  Primary Cardiologist:  Elouise Munroe, MD  Subjective   No CP or SOB, was having SOB w/ exertion, no more L CP, no more syncope, no palpitations, no awareness of the PVCs.  Inpatient Medications    Scheduled Meds: . aspirin EC  81 mg Oral Daily  . atorvastatin  40 mg Oral Daily  . brimonidine  1 drop Right Eye BID  . gabapentin  200 mg Oral QHS  . metoprolol tartrate  12.5 mg Oral BID  . tamsulosin  0.4 mg Oral Daily   Continuous Infusions: . heparin 1,150 Units/hr (04/25/20 0454)   PRN Meds: acetaminophen, nitroGLYCERIN, ondansetron (ZOFRAN) IV   Vital Signs    Vitals:   04/25/20 0630 04/25/20 0800 04/25/20 0845 04/25/20 0916  BP: 97/84 115/77 120/78 120/78  Pulse: 75 72 84 82  Resp: 12 15 (!) 22   Temp:      TempSrc:      SpO2: 97% 98% 99%   Weight:      Height:       No intake or output data in the 24 hours ending 04/25/20 1053 Filed Weights   04/24/20 1541  Weight: 90.7 kg   Last Weight  Most recent update: 04/24/2020  3:41 PM   Weight  90.7 kg (200 lb)           Weight change:    Telemetry    SR - Personally Reviewed  ECG    02/14 ECG is SR, HR 88, PVCs, no acute ischemic changes, no pathologic Q waves - Personally Reviewed  Physical Exam   General: Well developed, well nourished, male appearing in no acute distress. Head: Normocephalic, atraumatic.  Neck: Supple without bruits, JVD not elevated. Lungs:  Resp regular and unlabored, CTA. Heart: RRR, S1, S2, no S3, S4, or murmur; no rub. Abdomen: Soft, non-tender, non-distended with normoactive bowel sounds. No hepatomegaly. No rebound/guarding. No obvious abdominal masses. Extremities: No clubbing, cyanosis, no edema. Distal pedal pulses are 2+ bilaterally. Neuro: Alert and oriented X 3. Moves all extremities spontaneously. Psych: Normal affect.  Labs    Hematology Recent Labs  Lab 04/24/20 1542  04/25/20 0446  WBC 8.7 6.7  RBC 4.65 4.28  HGB 13.3 12.5*  HCT 41.9 38.6*  MCV 90.1 90.2  MCH 28.6 29.2  MCHC 31.7 32.4  RDW 13.3 13.5  PLT 249 217    Chemistry Recent Labs  Lab 04/24/20 1542 04/25/20 0929  NA 134* 138  K 3.8 4.1  CL 99 105  CO2 22 24  GLUCOSE 123* 97  BUN 11 10  CREATININE 1.35* 1.06  CALCIUM 9.2 9.2  GFRNONAA 54* >60  ANIONGAP 13 9     High Sensitivity Troponin:   Recent Labs  Lab 04/24/20 1542 04/24/20 1845  TROPONINIHS 349* 315*      BNP Recent Labs  Lab 04/25/20 0446  BNP 366.5*    Lab Results  Component Value Date   CHOL 202 (H) 04/25/2020   HDL 44 04/25/2020   LDLCALC 139 (H) 04/25/2020   TRIG 95 04/25/2020   CHOLHDL 4.6 04/25/2020    DDimer No results for input(s): DDIMER in the last 168 hours.   Radiology    DG Chest 2 View  Result Date: 04/24/2020 CLINICAL DATA:  Dyspnea on exertion, left chest and arm pain, fell EXAM: CHEST - 2 VIEW COMPARISON:  None FINDINGS: The heart size and mediastinal contours are within  normal limits. Both lungs are clear. The visualized skeletal structures are unremarkable. IMPRESSION: No active cardiopulmonary disease. Electronically Signed   By: Randa Ngo M.D.   On: 04/24/2020 16:35     Cardiac Studies   ECHO:  Ordered  CATH: Ordered  Patient Profile     80 y.o. male w/ hx prostate CA s/p XRT 2017 and now PSA low, borderline HLD, polyneuropathy, back problems, was admitted 02/14 after a syncopal episode with some L chest pain, NSTEMI.   Assessment & Plan    1.  Non-STEMI -He is on aspirin, beta-blocker, high-dose statin and heparin -He is asymptomatic - Cardiac catheterization was discussed with the patient fully. The patient understands that risks include but are not limited to stroke (1 in 1000), death (1 in 31), kidney failure [usually temporary] (1 in 500), bleeding (1 in 200), allergic reaction [possibly serious] (1 in 200).  The patient understands and is willing to  proceed.   -He is n.p.o., cath later today  2.  Hyperlipidemia: -He has been trying diet management, has not been on a statin -Continue Lipitor 40 mg qd  3.  PVCs -He has no palpitations, is not aware of the PVCs -Continue to follow on telemetry, keep potassium greater than four, will check magnesium but suspect it is okay  4.  Recent COVID infection -See Dr. Thompson Caul note. -He got exposure from his son on 1/29, tested positive on 2/3. -He has been asymptomatic and his chest x-ray this admission is normal. -According to Dr. Thompson Caul note, he has completed 10 days of quarantine and no longer needs to be on isolation or contact precautions  Active Problems:   NSTEMI (non-ST elevated myocardial infarction) [facility])    Signed, Rosaria Ferries , PA-C 10:53 AM 04/25/2020 Pager: [phone]  Patient seen and examined with Rosaria Ferries PA-C.  Agree as above, with the following exceptions and changes as noted below. Patient has had weakness and SOB with exertion, mild CP and pressure. Symptoms started on Sunday with worsening shortness of breath. Experienced a syncopal episode as well per report. Gen: NAD, CV: RRR, no murmurs, Lungs: clear, Abd: soft, Extrem: Warm, well perfused, no edema, Neuro/Psych: alert and oriented x 3, normal mood and affect. All available labs, radiology testing, previous records reviewed.  Covid positive test on 04/13/20, repeat test is positive. Medicine consult appreciated, they do not feel this represents repeat infection and likely he is out of quarantine period.   Troponin is elevated, overall flat, but likely representative of current presentation and may represent ACS. No cross sectional imaging to guide on coronary anatomy. It would be best to perform cardiac catheterization at this point given symptoms.   Plan for cath this afternoon.  INFORMED CONSENT: I have reviewed the risks, indications, and alternatives to cardiac catheterization, possible angioplasty,  and stenting with the patient. Risks include but are not limited to bleeding, infection, vascular injury, stroke, myocardial infection, arrhythmia, kidney injury, radiation-related injury in the case of prolonged fluoroscopy use, emergency cardiac surgery, and death. The patient understands the risks of serious complication is 1-2 in 8657 with diagnostic cardiac cath and 1-2% or less with angioplasty/stenting.    Elouise Munroe, MD 04/25/20 11:03 AM

## 2020-04-25 NOTE — Interval H&P Note (Signed)
History and Physical Interval Note:  04/25/2020 1:36 PM  Scott Schneider  has presented today for surgery, with the diagnosis of nstemi.  The various methods of treatment have been discussed with the patient and family. After consideration of risks, benefits and other options for treatment, the patient has consented to  Procedure(s): LEFT HEART CATH AND CORONARY ANGIOGRAPHY (N/A) as a surgical intervention.  The patient's history has been reviewed, patient examined, no change in status, stable for surgery.  I have reviewed the patient's chart and labs.  Questions were answered to the patient's satisfaction.   Cath Lab Visit (complete for each Cath Lab visit)  Clinical Evaluation Leading to the Procedure:   ACS: Yes.    Non-ACS:    Anginal Classification: CCS III  Anti-ischemic medical therapy: Minimal Therapy (1 class of medications)  Non-Invasive Test Results: No non-invasive testing performed  Prior CABG: No previous CABG        [facility] 04/25/2020 1:37 PM

## 2020-04-25 NOTE — Plan of Care (Signed)
?  Problem: Education: ?Goal: Ability to verbalize understanding of medication therapies will improve ?Outcome: Progressing ?  ?Problem: Activity: ?Goal: Capacity to carry out activities will improve ?Outcome: Progressing ?  ?Problem: Cardiac: ?Goal: Ability to achieve and maintain adequate cardiopulmonary perfusion will improve ?Outcome: Progressing ?  ?

## 2020-04-25 NOTE — H&P (View-Only) (Signed)
Progress Note  Patient Name: Scott Schneider Date of Encounter: 04/25/2020  Primary Cardiologist:  Elouise Munroe, MD  Subjective   No CP or SOB, was having SOB w/ exertion, no more L CP, no more syncope, no palpitations, no awareness of the PVCs.  Inpatient Medications    Scheduled Meds: . aspirin EC  81 mg Oral Daily  . atorvastatin  40 mg Oral Daily  . brimonidine  1 drop Right Eye BID  . gabapentin  200 mg Oral QHS  . metoprolol tartrate  12.5 mg Oral BID  . tamsulosin  0.4 mg Oral Daily   Continuous Infusions: . heparin 1,150 Units/hr (04/25/20 0454)   PRN Meds: acetaminophen, nitroGLYCERIN, ondansetron (ZOFRAN) IV   Vital Signs    Vitals:   04/25/20 0630 04/25/20 0800 04/25/20 0845 04/25/20 0916  BP: 97/84 115/77 120/78 120/78  Pulse: 75 72 84 82  Resp: 12 15 (!) 22   Temp:      TempSrc:      SpO2: 97% 98% 99%   Weight:      Height:       No intake or output data in the 24 hours ending 04/25/20 1053 Filed Weights   04/24/20 1541  Weight: 90.7 kg   Last Weight  Most recent update: 04/24/2020  3:41 PM   Weight  90.7 kg (200 lb)           Weight change:    Telemetry    SR - Personally Reviewed  ECG    02/14 ECG is SR, HR 88, PVCs, no acute ischemic changes, no pathologic Q waves - Personally Reviewed  Physical Exam   General: Well developed, well nourished, male appearing in no acute distress. Head: Normocephalic, atraumatic.  Neck: Supple without bruits, JVD not elevated. Lungs:  Resp regular and unlabored, CTA. Heart: RRR, S1, S2, no S3, S4, or murmur; no rub. Abdomen: Soft, non-tender, non-distended with normoactive bowel sounds. No hepatomegaly. No rebound/guarding. No obvious abdominal masses. Extremities: No clubbing, cyanosis, no edema. Distal pedal pulses are 2+ bilaterally. Neuro: Alert and oriented X 3. Moves all extremities spontaneously. Psych: Normal affect.  Labs    Hematology Recent Labs  Lab 04/24/20 1542  04/25/20 0446  WBC 8.7 6.7  RBC 4.65 4.28  HGB 13.3 12.5*  HCT 41.9 38.6*  MCV 90.1 90.2  MCH 28.6 29.2  MCHC 31.7 32.4  RDW 13.3 13.5  PLT 249 217    Chemistry Recent Labs  Lab 04/24/20 1542 04/25/20 0929  NA 134* 138  K 3.8 4.1  CL 99 105  CO2 22 24  GLUCOSE 123* 97  BUN 11 10  CREATININE 1.35* 1.06  CALCIUM 9.2 9.2  GFRNONAA 54* >60  ANIONGAP 13 9     High Sensitivity Troponin:   Recent Labs  Lab 04/24/20 1542 04/24/20 1845  TROPONINIHS 349* 315*      BNP Recent Labs  Lab 04/25/20 0446  BNP 366.5*    Lab Results  Component Value Date   CHOL 202 (H) 04/25/2020   HDL 44 04/25/2020   LDLCALC 139 (H) 04/25/2020   TRIG 95 04/25/2020   CHOLHDL 4.6 04/25/2020    DDimer No results for input(s): DDIMER in the last 168 hours.   Radiology    DG Chest 2 View  Result Date: 04/24/2020 CLINICAL DATA:  Dyspnea on exertion, left chest and arm pain, fell EXAM: CHEST - 2 VIEW COMPARISON:  None FINDINGS: The heart size and mediastinal contours are within  normal limits. Both lungs are clear. The visualized skeletal structures are unremarkable. IMPRESSION: No active cardiopulmonary disease. Electronically Signed   By: Randa Ngo M.D.   On: 04/24/2020 16:35     Cardiac Studies   ECHO:  Ordered  CATH: Ordered  Patient Profile     80 y.o. male w/ hx prostate CA s/p XRT 2017 and now PSA low, borderline HLD, polyneuropathy, back problems, was admitted 02/14 after a syncopal episode with some L chest pain, NSTEMI.   Assessment & Plan    1.  Non-STEMI -He is on aspirin, beta-blocker, high-dose statin and heparin -He is asymptomatic - Cardiac catheterization was discussed with the patient fully. The patient understands that risks include but are not limited to stroke (1 in 1000), death (1 in 59), kidney failure [usually temporary] (1 in 500), bleeding (1 in 200), allergic reaction [possibly serious] (1 in 200).  The patient understands and is willing to  proceed.   -He is n.p.o., cath later today  2.  Hyperlipidemia: -He has been trying diet management, has not been on a statin -Continue Lipitor 40 mg qd  3.  PVCs -He has no palpitations, is not aware of the PVCs -Continue to follow on telemetry, keep potassium greater than four, will check magnesium but suspect it is okay  4.  Recent COVID infection -See Dr. Thompson Caul note. -He got exposure from his son on 1/29, tested positive on 2/3. -He has been asymptomatic and his chest x-ray this admission is normal. -According to Dr. Thompson Caul note, he has completed 10 days of quarantine and no longer needs to be on isolation or contact precautions  Active Problems:   NSTEMI (non-ST elevated myocardial infarction) [facility])    Signed, Rosaria Ferries , PA-C 10:53 AM 04/25/2020 Pager: [phone]  Patient seen and examined with Rosaria Ferries PA-C.  Agree as above, with the following exceptions and changes as noted below. Patient has had weakness and SOB with exertion, mild CP and pressure. Symptoms started on Sunday with worsening shortness of breath. Experienced a syncopal episode as well per report. Gen: NAD, CV: RRR, no murmurs, Lungs: clear, Abd: soft, Extrem: Warm, well perfused, no edema, Neuro/Psych: alert and oriented x 3, normal mood and affect. All available labs, radiology testing, previous records reviewed.  Covid positive test on 04/13/20, repeat test is positive. Medicine consult appreciated, they do not feel this represents repeat infection and likely he is out of quarantine period.   Troponin is elevated, overall flat, but likely representative of current presentation and may represent ACS. No cross sectional imaging to guide on coronary anatomy. It would be best to perform cardiac catheterization at this point given symptoms.   Plan for cath this afternoon.  INFORMED CONSENT: I have reviewed the risks, indications, and alternatives to cardiac catheterization, possible angioplasty,  and stenting with the patient. Risks include but are not limited to bleeding, infection, vascular injury, stroke, myocardial infection, arrhythmia, kidney injury, radiation-related injury in the case of prolonged fluoroscopy use, emergency cardiac surgery, and death. The patient understands the risks of serious complication is 1-2 in 5520 with diagnostic cardiac cath and 1-2% or less with angioplasty/stenting.    Elouise Munroe, MD 04/25/20 11:03 AM

## 2020-04-25 NOTE — Consult Note (Addendum)
[facility] Medical Consultation  Scott Schneider OFB:[id number] DOB: [birth year] DOA: 04/24/2020 PCP: Janie Morning, DO   Requesting physician: Cherlynn Kaiser, MD Date of consultation: 04/25/2020 Reason for consultation: COVID-19 positive  Impression/Recommendations Active Problems:   NSTEMI (non-ST elevated myocardial infarction) ([place])    1. NSTEMI: Patient presented after having suspected brief syncopal event with complaints of left-sided chest discomfort.  Troponin elevated into the 300s without significant ischemic changes.  Cardiology has started the patient on a heparin drip with plans for left heart cath.   -Aspirin, statin, and beta-blocker  -N.p.o. with plans for left heart cath -Otherwise management per cardiology  2. COVID-19 infection: Patient reports being exposed likely to COVID-19 by son on 1/29 when he came to visit.  He tested at [facility] on 3/3 and was contacted and told results were positive.  Chest x-ray was negative for any acute abnormality. Patient appears to be asymptomatic.  Previous hospital policy required 21 days of isolation, but new policy only recommends 10 days of isolation if asymptomatic.  Patient currently has his son going to get a copy of his results to place in his chart. -He does not meet criteria for treatment with remdesivir or steroids at this time -Discontinue isolation precautions   3. Lower extremity edema: Patient with very mild lower extremity edema morr significant on the right lower extremity than the left.  Patient's BNP was mildly elevated into the 300s, but chest x-ray was otherwise clear.  4. History of prostate cancer -Continue Flomax  5. Polyneuropathy -Continue gabapentin  I will followup again tomorrow. Please contact me if I can be of assistance in the meanwhile. Thank you for this consultation.  Chief Complaint: Fall  HPI:  Scott Schneider is a 80 y.o. male with medical history significant of prostate cancer s/p radiation,  neuropathy, and back pain who presented after having a fall at home.  Yesterday while walking to the mailbox patient reported feeling somewhat short of breath and winded.  Upon getting back to the house he felt like his legs were getting weak and he tried to brace himself on the house and rested for second.  When he tried to continue he felt himself get lightheaded and he fell.  He thinks that he may have passed out but only for a brief second.  He scraped his hand and hit his knee, but denies any trauma to his head.  Associated symptoms included some left upper chest discomfort that was tender to palpation.  2 days ago he felt like he may have been getting a little short of breath with doing his household chores.  He recalls that his son that lives in [place] have visited on January 29th and upon going reported that he had tested positive for Covid.  The patient went to [facility] testing center at [place]  on the 3/3 and had tested positive for COVID-19.  He initially had a mild intermittent cough, but reports that he is not coughed in several days now.  Patient reports that he had received both COVID-19 vaccines as well as the booster.   Upon admission to the emergency department on 2/14 patient was noted to be afebrile with pulse 70-80s, blood pressure 97/75-140 2/93, and O2 saturations currently maintained 96-100%.  Labs were relatively unremarkable except for creatinine 1.35, troponin 349-> 315, BNP 366.5 total cholesterol 202, HDL 44, LDL 139, and triglycerides 95.  Chest x-ray showed no acute abnormalities.  Patient was admitted by cardiology and started on a heparin  drip and given full dose aspirin.  Plan was to take patient for cardiac cath.  COVID-19 screening came back positive for which the procedure was initially canceled.  Patient was able to provide positive test as seen below. Patient's name is at the top right-hand corner. Positive results from 04/13/2020.     Review of Systems: Review of Systems   Constitutional: Negative for fever and malaise/fatigue.  HENT: Negative for congestion and nosebleeds.   Eyes: Negative for photophobia and pain.  Respiratory: Positive for cough and shortness of breath.   Cardiovascular: Positive for leg swelling. Negative for chest pain.  Gastrointestinal: Negative for abdominal pain, nausea and vomiting.  Genitourinary: Negative for dysuria and frequency.  Musculoskeletal: Positive for falls.  Skin: Negative for rash.  Neurological: Positive for loss of consciousness and weakness.  Psychiatric/Behavioral: Negative for substance abuse. The patient is not nervous/anxious.      Past Medical History:  Diagnosis Date  . H/O back injury 1980's  . Low back pain   . Other spondylosis, lumbar region   . Paresthesia of skin   . Prostate cancer ([place])   . Radiculopathy   . Spinal stenosis of lumbar region    Past Surgical History:  Procedure Laterality Date  . PROSTATE BIOPSY    . TONSILLECTOMY     early 20's   Social History:  reports that he has never smoked. He has never used smokeless tobacco. He reports that he does not drink alcohol and does not use drugs.  No Known Allergies Family History  Problem Relation Age of Onset  . Cancer Mother        breast  . Cancer Maternal Grandmother        uterine  . Cancer Son        kidney cancer/excised  . Cirrhosis Father        liver  . Asthma Sister   . Other Brother        AIDS    Prior to Admission medications   Medication Sig Start Date End Date Taking? Authorizing Provider  acetaminophen (TYLENOL) 325 MG tablet Take 650 mg by mouth every 6 (six) hours as needed for mild pain.   Yes [provider]  brimonidine (ALPHAGAN) 0.2 % ophthalmic solution Place 1 drop into the right eye 2 (two) times daily. 11/03/19  Yes [provider]  gabapentin (NEURONTIN) 100 MG capsule Take 200 mg by mouth at bedtime. Take 1-2 every night   Yes [provider]  tamsulosin (FLOMAX) 0.4  MG CAPS capsule Take 0.4 mg by mouth daily.   Yes [provider]  VITAMIN D PO Take 1 tablet by mouth daily.   Yes [provider]   Physical Exam:  Constitutional: Elderly male currently in no acute distress and able to follow commands Vitals:   04/25/20 0445 04/25/20 0454 04/25/20 0530 04/25/20 0630  BP: 100/80  97/75 97/84  Pulse: 74  72 75  Resp: 14  16 12   Temp:  98.3 F (36.8 C)    TempSrc:  Oral    SpO2: 99%  98% 97%  Weight:      Height:       Eyes: PERRL, lids and conjunctivae normal ENMT: Mucous membranes are moist. Posterior pharynx clear of any exudate or lesions.  Neck: normal, supple, no masses, no thyromegaly Respiratory: clear to auscultation bilaterally, no wheezing, no crackles. Normal respiratory effort. No accessory muscle use.  Cardiovascular: Regular rate and rhythm, no gallop.  Trace left  lower extremity edema and +1 pitting lower extremity edema on the right.  2+ pedal pulses. No carotid bruits.  Abdomen: no tenderness, no masses palpated. No hepatosplenomegaly. Bowel sounds positive.  Musculoskeletal: no clubbing / cyanosis. No joint deformity upper and lower extremities. Good ROM, no contractures. Normal muscle tone.  Skin: Abrasion to the back of the right hand Neurologic: CN 2-12 grossly intact. Sensation intact, DTR normal. Strength 5/5 in all 4.  Psychiatric: Normal judgment and insight. Alert and oriented x 3. Normal mood.   Labs on Admission:  Basic Metabolic Panel: Recent Labs  Lab 04/24/20 1542 04/24/20 1845  NA 134*  --   K 3.8  --   CL 99  --   CO2 22  --   GLUCOSE 123*  --   BUN 11  --   CREATININE 1.35*  --   CALCIUM 9.2  --   MG  --  2.0   Liver Function Tests: No results for input(s): AST, ALT, ALKPHOS, BILITOT, PROT, ALBUMIN in the last 168 hours. No results for input(s): LIPASE, AMYLASE in the last 168 hours. No results for input(s): AMMONIA in the last 168 hours. CBC: Recent Labs  Lab 04/24/20 1542  04/25/20 0446  WBC 8.7 6.7  HGB 13.3 12.5*  HCT 41.9 38.6*  MCV 90.1 90.2  PLT 249 217   Cardiac Enzymes: No results for input(s): CKTOTAL, CKMB, CKMBINDEX, TROPONINI in the last 168 hours. BNP: Invalid input(s): POCBNP CBG: Recent Labs  Lab 04/24/20 1844  GLUCAP 95    Radiological Exams on Admission: DG Chest 2 View  Result Date: 04/24/2020 CLINICAL DATA:  Dyspnea on exertion, left chest and arm pain, fell EXAM: CHEST - 2 VIEW COMPARISON:  None FINDINGS: The heart size and mediastinal contours are within normal limits. Both lungs are clear. The visualized skeletal structures are unremarkable. IMPRESSION: No active cardiopulmonary disease. Electronically Signed   By: Randa Ngo M.D.   On: 04/24/2020 16:35    EKG: Independently reviewed.  Sinus rhythm at 86 bpm with premature complexes  Time spent: >45 minutes  [facility]   If 7PM-7AM, please contact night-coverage

## 2020-04-25 NOTE — ED Notes (Signed)
Pt placed on hospital bed

## 2020-04-25 NOTE — Progress Notes (Signed)
[place] for heparin Indication: chest pain/ACS  No Known Allergies  Patient Measurements: Height: 5\' 10"  (177.8 cm) Weight: 90.7 kg (200 lb) IBW/kg (Calculated) : 73 Heparin Dosing Weight: 90.7 kg   Vital Signs: Temp: 98.3 F (36.8 C) (02/15 0454) Temp Source: Oral (02/15 0454) BP: 100/80 (02/15 0445) Pulse Rate: 74 (02/15 0445)  Labs: Recent Labs    04/24/20 1542 04/24/20 1845 04/25/20 0446  HGB 13.3  --  12.5*  HCT 41.9  --  38.6*  PLT 249  --  217  HEPARINUNFRC  --   --  0.37  CREATININE 1.35*  --   --   TROPONINIHS 349* 315*  --     Estimated Creatinine Clearance: 51.1 mL/min (A) (by C-G formula based on SCr of 1.35 mg/dL (H)).  Assessment: 80 y.o. male with SOB, elevated troponin, for heparin  Goal of Therapy:  Heparin level 0.3-0.7 units/ml Monitor platelets by anticoagulation protocol: Yes   Plan:  Continue Heparin at current rate   Phillis Knack, PharmD, BCPS

## 2020-04-26 ENCOUNTER — Encounter (HOSPITAL_COMMUNITY): Payer: Self-pay | Admitting: Cardiology

## 2020-04-26 ENCOUNTER — Inpatient Hospital Stay (HOSPITAL_COMMUNITY): Payer: Medicare (Managed Care)

## 2020-04-26 DIAGNOSIS — R55 Syncope and collapse: Secondary | ICD-10-CM

## 2020-04-26 DIAGNOSIS — I214 Non-ST elevation (NSTEMI) myocardial infarction: Secondary | ICD-10-CM | POA: Diagnosis not present

## 2020-04-26 DIAGNOSIS — R778 Other specified abnormalities of plasma proteins: Secondary | ICD-10-CM | POA: Diagnosis not present

## 2020-04-26 HISTORY — DX: Syncope and collapse: R55

## 2020-04-26 LAB — CBC
HCT: 35 % — ABNORMAL LOW (ref 39.0–52.0)
Hemoglobin: 11.9 g/dL — ABNORMAL LOW (ref 13.0–17.0)
MCH: 29.7 pg (ref 26.0–34.0)
MCHC: 34 g/dL (ref 30.0–36.0)
MCV: 87.3 fL (ref 80.0–100.0)
Platelets: 210 10*3/uL (ref 150–400)
RBC: 4.01 MIL/uL — ABNORMAL LOW (ref 4.22–5.81)
RDW: 13.4 % (ref 11.5–15.5)
WBC: 6.2 10*3/uL (ref 4.0–10.5)
nRBC: 0 % (ref 0.0–0.2)

## 2020-04-26 LAB — ECHOCARDIOGRAM COMPLETE
Height: 70 in
S' Lateral: 3 cm
Weight: 3220.8 [oz_av]

## 2020-04-26 LAB — BASIC METABOLIC PANEL WITH GFR
Anion gap: 10 (ref 5–15)
BUN: 10 mg/dL (ref 8–23)
CO2: 21 mmol/L — ABNORMAL LOW (ref 22–32)
Calcium: 8.9 mg/dL (ref 8.9–10.3)
Chloride: 108 mmol/L (ref 98–111)
Creatinine, Ser: 1.26 mg/dL — ABNORMAL HIGH (ref 0.61–1.24)
GFR, Estimated: 58 mL/min — ABNORMAL LOW
Glucose, Bld: 142 mg/dL — ABNORMAL HIGH (ref 70–99)
Potassium: 3.9 mmol/L (ref 3.5–5.1)
Sodium: 139 mmol/L (ref 135–145)

## 2020-04-26 LAB — HEPARIN LEVEL (UNFRACTIONATED): Heparin Unfractionated: <0.1 [IU]/mL — ABNORMAL LOW (ref 0.30–0.70)

## 2020-04-26 MED ORDER — ATORVASTATIN CALCIUM 40 MG PO TABS: 40.0000 mg | ORAL_TABLET | Freq: Every day | ORAL | 1 refills | Status: DC

## 2020-04-26 MED ORDER — METOPROLOL TARTRATE 25 MG PO TABS: 12.5000 mg | ORAL_TABLET | Freq: Two times a day (BID) | ORAL | 5 refills | Status: DC

## 2020-04-26 MED ORDER — ASPIRIN 81 MG PO TBEC: 81.0000 mg | DELAYED_RELEASE_TABLET | Freq: Every day | ORAL | Status: DC

## 2020-04-26 NOTE — Discharge Summary (Addendum)
Discharge Summary    Patient ID: Scott Schneider MRN: [id number]; DOB: 1940-05-20  Admit date: 04/24/2020 Discharge date: 04/26/2020  PCP:  Janie Morning, [facility]  Cardiologist:  Elouise Munroe, MD  Advanced Practice Provider:  No care team member to display Electrophysiologist:  None    Discharge Diagnoses    Principal Problem:   Elevated troponin Active Problems:   NSTEMI (non-ST elevated myocardial infarction) [facility])   Syncope and collapse    Diagnostic Studies/Procedures    Cath 04/25/2020  The left ventricular systolic function is normal.  LV end diastolic pressure is normal.  The left ventricular ejection fraction is 55-65% by visual estimate.   1. No significant coronary artery disease 2. Normal LV function 3. Normal LVEDP  Plan: medical management.    Echocardiogram 04/26/2020 1. Frequent PVCs during study. Left ventricular ejection fraction, by  estimation, is 50 to 55%. The left ventricle has low normal function. Left  ventricular diastolic parameters are indeterminate.  2. Right ventricular systolic function is normal. The right ventricular  size is moderately enlarged. There is moderately elevated pulmonary artery  systolic pressure. The estimated right ventricular systolic pressure is  17.6 mmHg.  3. The mitral valve is normal in structure. No evidence of mitral valve  regurgitation.  4. Tricuspid valve regurgitation is mild to moderate.  5. The aortic valve is tricuspid. Aortic valve regurgitation is not  visualized. No aortic stenosis is present.  6. Aortic dilatation noted. There is mild dilatation of the ascending  aorta, measuring 39 mm.  7. The inferior vena cava is normal in size with greater than 50%  respiratory variability, suggesting right atrial pressure of 3 mmHg.  _____________   History of Present Illness     Scott Schneider is a 80 y.o. male with PMH significant for prostate cancer s/p  radiation and polyneuropathy who presents with progressive dyspnea on exertion and syncope.   Scott Schneider was in his usual state of health until 2/12 when he started to notice weakness and shortness of breath with exertion. He states that he relaxed most of the day and felt well while resting. He had recurrence of his symptoms on 2/13 when he tried to exert himself and notes that his shortness of breath was worse. This morning, he tried to walk out and get the mail but developed significant lightheadedness, shortness of breath and mild chest pain with exertion. He notes that the chest pain was worse with palpation of his left chest. He rested against his house for a bit and then continued to walk into the house. Unfortunately, he had a syncopal episode. He does not feel that he was out for long. When he got into the house, he called his PCP who advised him to come to the ER. He has not had any recurrence of his symptoms while resting in the ER. He denies any current chest pain, shortness of breath, palpitations, lightheadedness, dizziness, lower leg swelling, orthopnea or PND. He has not significant family history of cardiac problems.   Hospital Course     Consultants: N/A  Patient was admitted for syncope, dyspnea on exertion and elevated troponin concerning for NSTEMI.  He was started on aspirin, beta-blocker and Lipitor.  Cardiac catheterization performed on 04/25/2020 by Dr. Martinique revealed normal coronary arteries, normal LVEDP.  Subsequent echocardiogram obtained on 04/26/2020 showed EF 50 to 55%, frequent PVCs during the study, RV size was moderately enlarged, RVSP 46.8 mmHg, mild to  moderate TR. His LDL was elevated and placed on Lipitor. Dr. Margaretann Loveless recommended consider outpatient sleep study to evaluate RV enlargement. He does have frequent PVCs on telemetry as well, will defer to MD to consider increase lopressor on follow up.      Did the patient have an acute coronary syndrome (MI, NSTEMI,  STEMI, etc) this admission?:  No.   The elevated Troponin was due to the acute medical illness (demand ischemia).      _____________  Discharge Vitals Blood pressure (!) 111/59, pulse 77, temperature 98 F (36.7 C), temperature source Oral, resp. rate 20, height 5\' 10"  (1.778 m), weight 91.3 kg, SpO2 96 %.  Filed Weights   04/24/20 1541 04/26/20 0400  Weight: 90.7 kg 91.3 kg    General: Well developed, well nourished, male appearing in no acute distress. Head: Normocephalic, atraumatic.      Neck: Supple without bruits, JVD not elevated. Lungs:  Resp regular and unlabored, CTA. Heart: RRR, S1, S2, no S3, S4, or murmur; no rub. Abdomen: Soft, non-tender, non-distended with normoactive bowel sounds. No hepatomegaly. No rebound/guarding. No obvious abdominal masses. Extremities: No clubbing, cyanosis, no edema. Distal pedal pulses are 2+ bilaterally. Neuro: Alert and oriented X 3. Moves all extremities spontaneously. Psych: Normal affect.  Labs & Radiologic Studies    CBC Recent Labs    04/25/20 0446 04/26/20 0240  WBC 6.7 6.2  HGB 12.5* 11.9*  HCT 38.6* 35.0*  MCV 90.2 87.3  PLT 217 962   Basic Metabolic Panel Recent Labs    04/24/20 1845 04/25/20 0929 04/26/20 1319  NA  --  138 139  K  --  4.1 3.9  CL  --  105 108  CO2  --  24 21*  GLUCOSE  --  97 142*  BUN  --  10 10  CREATININE  --  1.06 1.26*  CALCIUM  --  9.2 8.9  MG 2.0  --   --    Liver Function Tests No results for input(s): AST, ALT, ALKPHOS, BILITOT, PROT, ALBUMIN in the last 72 hours. No results for input(s): LIPASE, AMYLASE in the last 72 hours. High Sensitivity Troponin:   Recent Labs  Lab 04/24/20 1542 04/24/20 1845  TROPONINIHS 349* 315*    BNP Invalid input(s): POCBNP D-Dimer No results for input(s): DDIMER in the last 72 hours. Hemoglobin A1C No results for input(s): HGBA1C in the last 72 hours. Fasting Lipid Panel Recent Labs    04/25/20 0446  CHOL 202*  HDL 44  LDLCALC 139*   TRIG 95  CHOLHDL 4.6   Thyroid Function Tests Recent Labs    04/25/20 0446  TSH 1.745   _____________  DG Chest 2 View  Result Date: 04/24/2020 CLINICAL DATA:  Dyspnea on exertion, left chest and arm pain, fell EXAM: CHEST - 2 VIEW COMPARISON:  None FINDINGS: The heart size and mediastinal contours are within normal limits. Both lungs are clear. The visualized skeletal structures are unremarkable. IMPRESSION: No active cardiopulmonary disease. Electronically Signed   By: Randa Ngo M.D.   On: 04/24/2020 16:35   CARDIAC CATHETERIZATION  Result Date: 04/25/2020  The left ventricular systolic function is normal.  LV end diastolic pressure is normal.  The left ventricular ejection fraction is 55-65% by visual estimate.  1. No significant coronary artery disease 2. Normal LV function 3. Normal LVEDP Plan: medical management.   ECHOCARDIOGRAM COMPLETE  Result Date: 04/26/2020    ECHOCARDIOGRAM REPORT   Patient Name:   Scott Schneider  Date of Exam: 04/26/2020 Medical Rec #:  [id number]     Height:       70.0 in Accession #:    [id number]    Weight:       201.3 lb Date of Birth:  March 04, 1941     BSA:          2.093 m Patient Age:    48 years      BP:           111/59 mmHg Patient Gender: M             HR:           72 bpm. Exam Location:  Inpatient Procedure: 2D Echo Indications:    NSTEMI  History:        Patient has no prior history of Echocardiogram examinations.  Sonographer:    Johny Chess Referring Phys: [place]  1. Frequent PVCs during study. Left ventricular ejection fraction, by estimation, is 50 to 55%. The left ventricle has low normal function. Left ventricular diastolic parameters are indeterminate.  2. Right ventricular systolic function is normal. The right ventricular size is moderately enlarged. There is moderately elevated pulmonary artery systolic pressure. The estimated right ventricular systolic pressure is 10.1 mmHg.  3. The mitral valve is  normal in structure. No evidence of mitral valve regurgitation.  4. Tricuspid valve regurgitation is mild to moderate.  5. The aortic valve is tricuspid. Aortic valve regurgitation is not visualized. No aortic stenosis is present.  6. Aortic dilatation noted. There is mild dilatation of the ascending aorta, measuring 39 mm.  7. The inferior vena cava is normal in size with greater than 50% respiratory variability, suggesting right atrial pressure of 3 mmHg. FINDINGS  Left Ventricle: Left ventricular ejection fraction, by estimation, is 50 to 55%. The left ventricle has low normal function. The left ventricle has no regional wall motion abnormalities. The left ventricular internal cavity size was normal in size. There is no left ventricular hypertrophy. Left ventricular diastolic parameters are indeterminate. Right Ventricle: The right ventricular size is moderately enlarged. Right vetricular wall thickness was not well visualized. Right ventricular systolic function is normal. There is moderately elevated pulmonary artery systolic pressure. The tricuspid regurgitant velocity is 3.31 m/s, and with an assumed right atrial pressure of 3 mmHg, the estimated right ventricular systolic pressure is 75.1 mmHg. Left Atrium: Left atrial size was normal in size. Right Atrium: Right atrial size was normal in size. Pericardium: There is no evidence of pericardial effusion. Mitral Valve: The mitral valve is normal in structure. No evidence of mitral valve regurgitation. Tricuspid Valve: The tricuspid valve is normal in structure. Tricuspid valve regurgitation is mild to moderate. Aortic Valve: The aortic valve is tricuspid. Aortic valve regurgitation is not visualized. No aortic stenosis is present. Pulmonic Valve: The pulmonic valve was grossly normal. Pulmonic valve regurgitation is not visualized. Aorta: The aortic root is normal in size and structure and aortic dilatation noted. There is mild dilatation of the ascending  aorta, measuring 39 mm. Venous: The inferior vena cava is normal in size with greater than 50% respiratory variability, suggesting right atrial pressure of 3 mmHg. IAS/Shunts: No atrial level shunt detected by color flow Doppler.  LEFT VENTRICLE PLAX 2D LVIDd:         4.90 cm LVIDs:         3.00 cm LV PW:         1.00 cm LV IVS:  1.00 cm LVOT diam:     2.30 cm LVOT Area:     4.15 cm  IVC IVC diam: 1.80 cm LEFT ATRIUM             Index       RIGHT ATRIUM           Index LA diam:        3.50 cm 1.67 cm/m  RA Area:     16.70 cm LA Vol (A2C):   49.1 ml 23.46 ml/m RA Volume:   44.70 ml  21.36 ml/m LA Vol (A4C):   56.4 ml 26.95 ml/m LA Biplane Vol: 53.7 ml 25.66 ml/m   AORTA Ao Root diam: 3.40 cm Ao Asc diam:  3.90 cm TRICUSPID VALVE TR Peak grad:   43.8 mmHg TR Vmax:        331.00 cm/s  SHUNTS Systemic Diam: 2.30 cm Oswaldo Milian MD Electronically signed by Oswaldo Milian MD Signature Date/Time: 04/26/2020/4:12:16 PM    Final    Disposition   Pt is being discharged home today in good condition.  Follow-up Plans & Appointments     Follow-up Information    Erlene Quan, PA-C Follow up on 05/10/2020.   Specialties: Cardiology, Radiology Why: @1 :45PM. Cardiology follow up Contact information: [address] [place] [address] [phone]                Discharge Medications   Allergies as of 04/26/2020   No Known Allergies     Medication List    TAKE these medications   acetaminophen 325 MG tablet Commonly known as: TYLENOL Take 650 mg by mouth every 6 (six) hours as needed for mild pain.   aspirin 81 MG EC tablet Take 1 tablet (81 mg total) by mouth daily. Swallow whole. Start taking on: April 27, 2020   atorvastatin 40 MG tablet Commonly known as: LIPITOR Take 1 tablet (40 mg total) by mouth daily. Start taking on: April 27, 2020   brimonidine 0.2 % ophthalmic solution Commonly known as: ALPHAGAN Place 1 drop into the right eye  2 (two) times daily.   gabapentin 100 MG capsule Commonly known as: NEURONTIN Take 200 mg by mouth at bedtime. Take 1-2 every night   metoprolol tartrate 25 MG tablet Commonly known as: LOPRESSOR Take 0.5 tablets (12.5 mg total) by mouth 2 (two) times daily.   tamsulosin 0.4 MG Caps capsule Commonly known as: FLOMAX Take 0.4 mg by mouth daily.   VITAMIN D PO Take 1 tablet by mouth daily.          Outstanding Labs/Studies   None  Duration of Discharge Encounter   Greater than 30 minutes including physician time.  Hilbert Corrigan, PA 04/26/2020, 4:36 PM  Patient seen and examined with Almyra Deforest PA.  Agree as above, with the following exceptions and changes as noted below. Patient discussed the loss of his wife of 60 years, and has been struggling more lately after a brief period of improvement. He thinks he has a broken heart. We discussed possible stress cardiomyopathy and will check an echo. She passed 3 years ago, and he feels he has no reason to live. He has seen a counselor during times of suicidal ideation, but has improved from there. No current suicidal ideation or threat of harm to others. We had a meaningful conversation about the second phase of his life after his wife's passing. 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 Gen: NAD, CV: iRRR, no murmurs, Lungs: clear, Abd: soft, Extrem: Warm, well perfused, no edema, Neuro/Psych: alert and oriented x 3, normal mood and affect. All available labs, radiology testing, previous records reviewed.   Echo without findings of takotsubo syndrome, but elevated RVSP and RV dilation. I suspect he may have OSA. Will follow up in office. Frequent PVCs but he has some hypotension at baseline. Continue current dose of BB since EF is grossly normal. Will review further at office follow up.  Elouise Munroe, MD 04/26/20 7:56 PM

## 2020-04-26 NOTE — Progress Notes (Signed)
  Echocardiogram 2D Echocardiogram has been performed.  Scott Schneider 04/26/2020, 1:57 PM

## 2020-05-10 ENCOUNTER — Observation Stay (HOSPITAL_COMMUNITY)
Admission: EM | Admit: 2020-05-10 | Discharge: 2020-05-12 | Disposition: A | Discharge status: Home / Self Care | Payer: Medicare (Managed Care) | Attending: Family Medicine | Admitting: Family Medicine

## 2020-05-10 ENCOUNTER — Telehealth: Payer: Self-pay | Admitting: Cardiology

## 2020-05-10 ENCOUNTER — Encounter (HOSPITAL_COMMUNITY): Payer: Self-pay

## 2020-05-10 ENCOUNTER — Other Ambulatory Visit: Payer: Self-pay | Admitting: Cardiology

## 2020-05-10 ENCOUNTER — Other Ambulatory Visit: Payer: Self-pay

## 2020-05-10 ENCOUNTER — Telehealth: Payer: Self-pay | Admitting: Internal Medicine

## 2020-05-10 ENCOUNTER — Encounter: Payer: Self-pay | Admitting: Cardiology

## 2020-05-10 ENCOUNTER — Ambulatory Visit (INDEPENDENT_AMBULATORY_CARE_PROVIDER_SITE_OTHER): Payer: Medicare (Managed Care) | Admitting: Cardiology

## 2020-05-10 ENCOUNTER — Emergency Department (HOSPITAL_COMMUNITY): Payer: Medicare (Managed Care)

## 2020-05-10 VITALS — BP 126/68 | HR 68 | Ht 70.0 in | Wt 206.0 lb

## 2020-05-10 DIAGNOSIS — I2510 Atherosclerotic heart disease of native coronary artery without angina pectoris: Secondary | ICD-10-CM | POA: Diagnosis not present

## 2020-05-10 DIAGNOSIS — R7989 Other specified abnormal findings of blood chemistry: Secondary | ICD-10-CM

## 2020-05-10 DIAGNOSIS — I2699 Other pulmonary embolism without acute cor pulmonale: Principal | ICD-10-CM | POA: Diagnosis present

## 2020-05-10 DIAGNOSIS — R778 Other specified abnormalities of plasma proteins: Secondary | ICD-10-CM

## 2020-05-10 DIAGNOSIS — R55 Syncope and collapse: Secondary | ICD-10-CM

## 2020-05-10 DIAGNOSIS — E785 Hyperlipidemia, unspecified: Secondary | ICD-10-CM | POA: Diagnosis not present

## 2020-05-10 DIAGNOSIS — Z7982 Long term (current) use of aspirin: Secondary | ICD-10-CM | POA: Insufficient documentation

## 2020-05-10 DIAGNOSIS — R0602 Shortness of breath: Secondary | ICD-10-CM | POA: Diagnosis present

## 2020-05-10 DIAGNOSIS — Z20822 Contact with and (suspected) exposure to covid-19: Secondary | ICD-10-CM | POA: Insufficient documentation

## 2020-05-10 DIAGNOSIS — Z8546 Personal history of malignant neoplasm of prostate: Secondary | ICD-10-CM | POA: Diagnosis not present

## 2020-05-10 DIAGNOSIS — R262 Difficulty in walking, not elsewhere classified: Secondary | ICD-10-CM | POA: Diagnosis not present

## 2020-05-10 DIAGNOSIS — C61 Malignant neoplasm of prostate: Secondary | ICD-10-CM | POA: Diagnosis not present

## 2020-05-10 DIAGNOSIS — Z8616 Personal history of COVID-19: Secondary | ICD-10-CM | POA: Diagnosis not present

## 2020-05-10 DIAGNOSIS — D500 Iron deficiency anemia secondary to blood loss (chronic): Secondary | ICD-10-CM | POA: Insufficient documentation

## 2020-05-10 DIAGNOSIS — I82401 Acute embolism and thrombosis of unspecified deep veins of right lower extremity: Secondary | ICD-10-CM | POA: Diagnosis not present

## 2020-05-10 DIAGNOSIS — I2694 Multiple subsegmental pulmonary emboli without acute cor pulmonale: Secondary | ICD-10-CM

## 2020-05-10 DIAGNOSIS — D649 Anemia, unspecified: Secondary | ICD-10-CM | POA: Diagnosis present

## 2020-05-10 HISTORY — DX: Hyperlipidemia, unspecified: E78.5

## 2020-05-10 HISTORY — DX: Atherosclerotic heart disease of native coronary artery without angina pectoris: I25.10

## 2020-05-10 LAB — BASIC METABOLIC PANEL WITH GFR
Anion gap: 11 (ref 5–15)
BUN: 8 mg/dL (ref 8–23)
CO2: 24 mmol/L (ref 22–32)
Calcium: 9.6 mg/dL (ref 8.9–10.3)
Chloride: 103 mmol/L (ref 98–111)
Creatinine, Ser: 1.07 mg/dL (ref 0.61–1.24)
GFR, Estimated: >60 mL/min
Glucose, Bld: 82 mg/dL (ref 70–99)
Potassium: 4.1 mmol/L (ref 3.5–5.1)
Sodium: 138 mmol/L (ref 135–145)

## 2020-05-10 LAB — CBC
HCT: 39.5 % (ref 39.0–52.0)
Hemoglobin: 12.7 g/dL — ABNORMAL LOW (ref 13.0–17.0)
MCH: 28.5 pg (ref 26.0–34.0)
MCHC: 32.2 g/dL (ref 30.0–36.0)
MCV: 88.8 fL (ref 80.0–100.0)
Platelets: 289 10*3/uL (ref 150–400)
RBC: 4.45 MIL/uL (ref 4.22–5.81)
RDW: 13.1 % (ref 11.5–15.5)
WBC: 7 10*3/uL (ref 4.0–10.5)
nRBC: 0 % (ref 0.0–0.2)

## 2020-05-10 LAB — D-DIMER, QUANTITATIVE: D-DIMER: >4.4 mg{FEU}/L — ABNORMAL HIGH (ref 0.00–0.49)

## 2020-05-10 MED ORDER — IOHEXOL 350 MG/ML SOLN
50.0000 mL | Freq: Once | INTRAVENOUS | Status: AC | PRN
  Administered 2020-05-10: 50 mL via INTRAVENOUS

## 2020-05-10 MED ORDER — ACETAMINOPHEN 325 MG PO TABS: 650.0000 mg | ORAL_TABLET | Freq: Four times a day (QID) | ORAL | Status: DC | PRN

## 2020-05-10 MED ORDER — HEPARIN (PORCINE) 25000 UT/250ML-% IV SOLN
1300.0000 [IU]/h | INTRAVENOUS | Status: DC
  Administered 2020-05-10 – 2020-05-11 (×2): 1400 [IU]/h via INTRAVENOUS
  Filled 2020-05-10 (×2): qty 250

## 2020-05-10 MED ORDER — ONDANSETRON HCL 4 MG/2ML IJ SOLN: 4.0000 mg | Freq: Four times a day (QID) | INTRAMUSCULAR | Status: DC | PRN

## 2020-05-10 MED ORDER — ONDANSETRON HCL 4 MG PO TABS: 4.0000 mg | ORAL_TABLET | Freq: Four times a day (QID) | ORAL | Status: DC | PRN

## 2020-05-10 MED ORDER — HEPARIN BOLUS VIA INFUSION
5000.0000 [IU] | Freq: Once | INTRAVENOUS | Status: AC
  Administered 2020-05-10: 5000 [IU] via INTRAVENOUS
  Filled 2020-05-10: qty 5000

## 2020-05-10 MED ORDER — ACETAMINOPHEN 650 MG RE SUPP: 650.0000 mg | Freq: Four times a day (QID) | RECTAL | Status: DC | PRN

## 2020-05-10 NOTE — Assessment & Plan Note (Signed)
R/O PE?  °

## 2020-05-10 NOTE — Telephone Encounter (Signed)
Rod from [employer] is calling to give critical lab results.

## 2020-05-10 NOTE — Telephone Encounter (Signed)
Received call from Rod at [facility] who states that patients D-Dimer came back elevated at greater than 4.40.   Spoke with DOD (Dr. Martinique) who recommends patient get Chest CT ASAP- Per Dr. Martinique patient can have done tomorrow.   Called and spoke with patient, advised patient that D-Dimer was elevated there fore we will need to get CT of the Chest as soon as possible. Advised patient that I have placed the order and sent stat message to scheduling to get patient scheduled first thing tomorrow.   Advised patient patient of ED precautions should symptoms worsen. Patient verbalized understanding.   Will forward this message to Scheduling to get CTA scheduled.

## 2020-05-10 NOTE — Progress Notes (Signed)
ANTICOAGULATION CONSULT NOTE - Initial Consult  Pharmacy Consult for heparin Indication: pulmonary embolus  No Known Allergies  Patient Measurements: Height: 5\' 10"  (177.8 cm) Weight: 93.4 kg (206 lb) IBW/kg (Calculated) : 73 Heparin Dosing Weight: 90kg  Vital Signs: Temp: 98.3 F (36.8 C) (03/02 2009) Temp Source: Oral (03/02 2009) BP: 132/65 (03/02 2037) Pulse Rate: 87 (03/02 2037)  Labs: Recent Labs    05/10/20 2011  HGB 12.7*  HCT 39.5  PLT 289  CREATININE 1.07    Estimated Creatinine Clearance: 65.3 mL/min (by C-G formula based on SCr of 1.07 mg/dL).   Medical History: Past Medical History:  Diagnosis Date  . H/O back injury 1980's  . Low back pain   . Other spondylosis, lumbar region   . Paresthesia of skin   . Prostate cancer ([place])   . Radiculopathy   . 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  Goal of Therapy:  Heparin level 0.3-0.7 units/ml Monitor platelets by anticoagulation protocol: Yes   Plan:  Will give heparin 5000 units IV bolus followed by gtt at 1400 units/hr and monitor heparin levels and CBC.  Wynona Neat, PharmD, BCPS  05/10/2020,10:54 PM

## 2020-05-10 NOTE — Patient Instructions (Signed)
Medication Instructions:  No Changes *If you need a refill on your cardiac medications before your next appointment, please call your pharmacy*   Lab Work: D-Dimer If you have labs (blood work) drawn today and your tests are completely normal, you will receive your results only by: Marland Kitchen MyChart Message (if you have MyChart) OR . A paper copy in the mail If you have any lab test that is abnormal or we need to change your treatment, we will call you to review the results.   Testing/Procedures: No Testing   Follow-Up: At [facility], you and your health needs are our priority.  As part of our continuing mission to provide you with exceptional heart care, we have created designated Provider Care Teams.  These Care Teams include your primary Cardiologist (physician) and Advanced Practice Providers (APPs -  Physician Assistants and Nurse Practitioners) who all work together to provide you with the care you need, when you need it.   Your next appointment:   3 month(s)  The format for your next appointment:   In Person  Provider:   Cherlynn Kaiser, MD

## 2020-05-10 NOTE — H&P (Signed)
History and Physical    Scott Schneider QTM:[id number] DOB: November 05, 1940 DOA: 05/10/2020  PCP: Janie Morning, DO   Patient coming from: Home.   I have personally briefly reviewed patient's old medical records in [place]  Chief Complaint: Right leg swelling.  HPI: Scott Schneider is a 80 y.o. male with medical history significant of back injury, chronic lower back pain, spondylosis of lumbar region, prostate cancer, CAD, recently admitted and discharged for syncope/collapse NSTEMI who is coming to the emergency department from his PCP office due to right lower extremity edema associated with dyspnea since yesterday.  His PCP obtained a D-dimer at the office which was over 4 and sent them to the emergency department for further evaluation.  CTA chest done in the ED showed bilateral PE.  He mentions that he has been very sedentary since he was discharged from the hospital.  He has been mostly seated or in bed.  He denies any travel history.  He denies fever, chills, rhinorrhea, sore throat, productive cough, wheezing or hemoptysis.  No chest pain, palpitations, diaphoresis, PND orthopnea.  Denies abdominal pain, nausea, vomiting, diarrhea, constipation, melena or hematochezia.  No dysuria, frequency or hematuria.  No polyuria, polydipsia, polyphagia or blurred vision.  ED Course: Initial vital signs were temperature 98.3 F, Schneider 93, respiration 15, BP 152/91 mmHg O2 sat 100% on room air.  The patient was given supplemental oxygen and a heparin infusion was begun.  Labwork: CBC shows a white count of 7.0, hemoglobin 12.7 g/dL and platelets 289.  BNP was normal.  SARS coronavirus 2 is negative.  Troponin still pending.  Review of Systems: As per HPI otherwise all other systems reviewed and are negative.  Past Medical History:  Diagnosis Date  . CAD (coronary artery disease) 05/10/2020  . H/O back injury 1980's  . Hyperlipidemia 05/10/2020  . Low back pain   . NSTEMI (non-ST elevated myocardial  infarction) ([place]) 04/24/2020  . Other spondylosis, lumbar region   . Paresthesia of skin   . Prostate cancer ([place])   . Radiculopathy   . Spinal stenosis of lumbar region   . Syncope and collapse 04/26/2020   Past Surgical History:  Procedure Laterality Date  . LEFT HEART CATH AND CORONARY ANGIOGRAPHY N/A 04/25/2020   Procedure: LEFT HEART CATH AND CORONARY ANGIOGRAPHY;  Surgeon: Martinique, Peter M, MD;  Location: [facility];  Service: Cardiovascular;  Laterality: N/A;  . PROSTATE BIOPSY    . TONSILLECTOMY     early 20's   Social History  reports that he has never smoked. He has never used smokeless tobacco. He reports that he does not drink alcohol and does not use drugs.  No Known Allergies  Family History  Problem Relation Age of Onset  . Cancer Mother        breast  . Cancer Maternal Grandmother        uterine  . Cancer Son        kidney cancer/excised  . Cirrhosis Father        liver  . Asthma Sister   . Other Brother        AIDS  . Diabetes Sister    Prior to Admission medications   Medication Sig Start Date End Date Taking? Authorizing Provider  acetaminophen (TYLENOL) 325 MG tablet Take 650 mg by mouth every 6 (six) hours as needed for mild pain.    [provider]  aspirin EC 81 MG EC tablet Take 1 tablet (81  mg total) by mouth daily. Swallow whole. 04/27/20   Almyra Deforest, PA  atorvastatin (LIPITOR) 40 MG tablet Take 1 tablet (40 mg total) by mouth daily. 04/27/20   Almyra Deforest, PA  brimonidine (ALPHAGAN) 0.2 % ophthalmic solution Place 1 drop into the right eye 2 (two) times daily. 11/03/19   [provider]  gabapentin (NEURONTIN) 100 MG capsule Take 200 mg by mouth at bedtime. Take 1-2 every night    [provider]  metoprolol tartrate (LOPRESSOR) 25 MG tablet Take 0.5 tablets (12.5 mg total) by mouth 2 (two) times daily. 04/26/20   Almyra Deforest, PA  tamsulosin (FLOMAX) 0.4 MG CAPS capsule Take 0.4 mg by mouth daily.    [provider]  VITAMIN D PO Take 1 tablet by mouth daily.    [provider]   Physical Exam: Vitals:   05/10/20 2009 05/10/20 2037 05/10/20 2200  BP: (!) 152/91 132/65   Schneider: 93 87   Resp: 15 18   Temp: 98.3 F (36.8 C)    TempSrc: Oral    SpO2: 100% 98%   Weight:   93.4 kg  Height:   5\' 10"  (1.778 m)   Constitutional: NAD, calm, comfortable Eyes: PERRL, lids and conjunctivae normal ENMT: Mucous membranes are moist. Posterior pharynx clear of any exudate or lesions. Neck: normal, supple, no masses, no thyromegaly Respiratory: clear to auscultation bilaterally, no wheezing, no crackles. Normal respiratory effort. No accessory muscle use.  Cardiovascular: Regular rate and rhythm, no murmurs / rubs / gallops.  3+ RLE and 1+ LLE pitting edema. 2+ pedal pulses. No carotid bruits.  Abdomen: Obese, no distention.  Bowel sounds positive.  Soft, no tenderness, no masses palpated. No hepatosplenomegaly. Musculoskeletal: Mild generalized weakness.  No clubbing / cyanosis. Good ROM, no contractures. Normal muscle tone.  Skin: Some areas of ecchymosis on extremities. Neurologic: CN 2-12 grossly intact. Sensation intact, DTR normal. Strength 5/5 in all 4.  Psychiatric: Normal judgment and insight. Alert and oriented x 3. Normal mood.   Labs on Admission: I have personally reviewed following labs and imaging studies  CBC: Recent Labs  Lab 05/10/20 2011  WBC 7.0  HGB 12.7*  HCT 39.5  MCV 88.8  PLT 970    Basic Metabolic Panel: Recent Labs  Lab 05/10/20 2011  NA 138  K 4.1  CL 103  CO2 24  GLUCOSE 82  BUN 8  CREATININE 1.07  CALCIUM 9.6    GFR: Estimated Creatinine Clearance: 65.3 mL/min (by C-G formula based on SCr of 1.07 mg/dL).  Liver Function Tests: No results for input(s): AST, ALT, ALKPHOS, BILITOT, PROT, ALBUMIN in the last 168 hours.  Urine analysis: No results found for: COLORURINE, APPEARANCEUR, LABSPEC, PHURINE, GLUCOSEU, HGBUR, BILIRUBINUR, KETONESUR,  PROTEINUR, UROBILINOGEN, NITRITE, LEUKOCYTESUR  Radiological Exams on Admission: CT Angio Chest PE W and/or Wo Contrast  Result Date: 05/10/2020 CLINICAL DATA:  Short of breath EXAM: CT ANGIOGRAPHY CHEST WITH CONTRAST TECHNIQUE: Multidetector CT imaging of the chest was performed using the standard protocol during bolus administration of intravenous contrast. Multiplanar CT image reconstructions and MIPs were obtained to evaluate the vascular anatomy. CONTRAST:  62mL OMNIPAQUE IOHEXOL 350 MG/ML SOLN COMPARISON:  04/24/2020 FINDINGS: Cardiovascular: This is a technically adequate evaluation of the pulmonary vasculature. There are bilateral pulmonary emboli. Lobar emboli are seen within the right middle and right lower lobes. Segmental emboli are seen within the left lower lobe. Borderline dilatation of the right ventricle, with RV/LV ratio measuring 1.1. Heart is unremarkable  without pericardial effusion. Normal caliber of the thoracic aorta without evidence of dissection. Mediastinum/Nodes: No enlarged mediastinal, hilar, or axillary lymph nodes. Thyroid gland, trachea, and esophagus demonstrate no significant findings. Lungs/Pleura: No airspace disease, effusion, or pneumothorax. Central airways are patent. Upper Abdomen: No acute abnormality. Musculoskeletal: No acute or destructive bony lesions. Reconstructed images demonstrate no additional findings. Review of the MIP images confirms the above findings. IMPRESSION: 1. Bilateral pulmonary emboli, most pronounced in the right middle and right lower lobes. Positive for acute PE with CTevidence of right heart strain (RV/LV Ratio = 1.1) consistent with at least submassive (intermediate risk) PE. The presence of right heart strain has been associated with an increased risk of morbidity and mortality. Critical Value/emergent results were called by telephone at the time of interpretation on 05/10/2020 at 10:17 pm to provider DAVID YAO , who verbally acknowledged these  results. Electronically Signed   By: Randa Ngo M.D.   On: 05/10/2020 22:22    EKG: Independently reviewed.  Vent. rate 91 BPM PR interval 204 ms QRS duration 92 ms QT/QTc 382/469 ms P-R-T axes 53 11 79 Sinus rhythm with frequent Premature ventricular complexes Otherwise normal ECG  Assessment/Plan Principal Problem:   Bilateral pulmonary embolism (HCC) Observation/progressive unit. Supplemental oxygen as needed. Continue heparin infusion. Check troponin level. Obtain lower extremity venous Doppler Check transthoracic echocardiogram.  Active Problems:   Normocytic anemia Monitor H&H.    CAD (coronary artery disease) Continue aspirin 81 mg p.o. daily. Continue statin. Hold metoprolol due to RV strain.    Hyperlipidemia Continue atorvastatin 40 mg p.o. daily    DVT prophylaxis: On heparin infusion. Code Status:   Full code. Family Communication: Disposition Plan:   Patient is from:  Home.  Anticipated DC to:  Home.  Anticipated DC date:  05/12/2020.  Anticipated DC barriers: Clinical status/pending echocardiogram.  Consults called: Admission status:  Observation/progressive unit.  High due to new bilateral pulmonary embolism with RV strain.    Severity of Illness:The patient will need to remain in the hospital to be closely monitored due to high risk of complications.  Heparin will be continued and a new echocardiogram will be obtained to evaluate right-sided heart pressures.  Reubin Milan MD [facility]  How to contact the [facility] Attending or Consulting provider [place] or covering provider during after hours [place], for this patient?   1. Check the care team in [facility] and look for a) attending/consulting [facility] provider listed and b) the [facility] team listed 2. Log into [email or web address] and use [place]'s universal password to access. If you do not have the password, please contact the hospital operator. 3. Locate the [facility] provider you are looking for under  [facility] and page to a number that you can be directly reached. 4. If you still have difficulty reaching the provider, please page the [facility] (Director on Call) for the Hospitalists listed on amion for assistance.  05/10/2020, 11:12 PM   This document was prepared using Dragon voice recognition software and may contain some unintended transcription errors.

## 2020-05-10 NOTE — ED Triage Notes (Signed)
Pt reports that he want for follow up with his PCP today, reports pain and swelling in his R lower leg since yesterday. Elevated d dimer at PCP and sent here to rule out PE. Some SOB

## 2020-05-10 NOTE — Telephone Encounter (Signed)
I called the patient tonight to tell him his D-dimer was elevated and instructed him to go to the ED ASAP.  I spoke with the ED physician as well.  The patient understands and will comply.  Kerin Ransom PA-C 05/10/2020 7:31 PM

## 2020-05-10 NOTE — ED Provider Notes (Signed)
[facility] EMERGENCY DEPARTMENT Provider Note   CSN: [id number] Arrival date & time: 05/10/20  1958     History Chief Complaint  Patient presents with  . Abnormal Lab    Scott Schneider is a 80 y.o. male history of spinal stenosis, recent NSTEMI here presenting with shortness of breath. Patient recently had elevated troponin and had a unremarkable cardiac catheterization.  His echo showed dilated RV at that time.  Patient was discharged home with beta-blockers.  Patient states that for the last several days he is beginning have right leg swelling.  He states that today he started having shortness of breath.  He went to see his cardiologist and had elevated D-dimer and was sent in for CT PE study.  Patient was not hypoxic or hypotensive in triage.  Of note patient was diagnosed with COVID on 2/14  The history is provided by the patient.       Past Medical History:  Diagnosis Date  . H/O back injury 1980's  . Low back pain   . Other spondylosis, lumbar region   . Paresthesia of skin   . Prostate cancer ([place])   . Radiculopathy   . Spinal stenosis of lumbar region     Patient Active Problem List   Diagnosis Date Noted  . Syncope and collapse 04/26/2020  . Elevated troponin   . NSTEMI (non-ST elevated myocardial infarction) ([place]) 04/24/2020  . Malignant neoplasm of prostate ([place]) 12/18/2015    Past Surgical History:  Procedure Laterality Date  . LEFT HEART CATH AND CORONARY ANGIOGRAPHY N/A 04/25/2020   Procedure: LEFT HEART CATH AND CORONARY ANGIOGRAPHY;  Surgeon: Martinique, Peter M, MD;  Location: [facility];  Service: Cardiovascular;  Laterality: N/A;  . PROSTATE BIOPSY    . TONSILLECTOMY     early 20's       Family History  Problem Relation Age of Onset  . Cancer Mother        breast  . Cancer Maternal Grandmother        uterine  . Cancer Son        kidney cancer/excised  . Cirrhosis Father        liver  . Asthma Sister   . Other Brother         AIDS  . Diabetes Sister     Social History   Tobacco Use  . Smoking status: Never Smoker  . Smokeless tobacco: Never Used  Substance Use Topics  . Alcohol use: No  . Drug use: No    Home Medications Prior to Admission medications   Medication Sig Start Date End Date Taking? Authorizing Provider  acetaminophen (TYLENOL) 325 MG tablet Take 650 mg by mouth every 6 (six) hours as needed for mild pain.    [provider]  aspirin EC 81 MG EC tablet Take 1 tablet (81 mg total) by mouth daily. Swallow whole. 04/27/20   Almyra Deforest, PA  atorvastatin (LIPITOR) 40 MG tablet Take 1 tablet (40 mg total) by mouth daily. 04/27/20   Almyra Deforest, PA  brimonidine (ALPHAGAN) 0.2 % ophthalmic solution Place 1 drop into the right eye 2 (two) times daily. 11/03/19   [provider]  gabapentin (NEURONTIN) 100 MG capsule Take 200 mg by mouth at bedtime. Take 1-2 every night    [provider]  metoprolol tartrate (LOPRESSOR) 25 MG tablet Take 0.5 tablets (12.5 mg total) by mouth 2 (two) times daily. 04/26/20   Almyra Deforest, [place]  tamsulosin (FLOMAX) 0.4 MG CAPS capsule Take 0.4 mg by mouth daily.    [provider]  VITAMIN D PO Take 1 tablet by mouth daily.    [provider]    Allergies    Patient has no known allergies.  Review of Systems   Review of Systems  Respiratory: Positive for shortness of breath.   All other systems reviewed and are negative.   Physical Exam Updated Vital Signs BP 132/65 (BP Location: Right Arm)   Pulse 87   Temp 98.3 F (36.8 C) (Oral)   Resp 18   Ht 5\' 10"  (1.778 m)   Wt 93.4 kg   SpO2 98%   BMI 29.56 kg/m   Physical Exam Vitals and nursing note reviewed.  Constitutional:      Appearance: Normal appearance.  HENT:     Head: Normocephalic.     Nose: Nose normal.     Mouth/Throat:     Mouth: Mucous membranes are moist.  Eyes:     Extraocular Movements: Extraocular movements intact.     Pupils: Pupils are  equal, round, and reactive to light.  Cardiovascular:     Rate and Rhythm: Normal rate and regular rhythm.     Pulses: Normal pulses.     Heart sounds: Normal heart sounds.  Pulmonary:     Effort: Pulmonary effort is normal.     Breath sounds: Normal breath sounds.  Abdominal:     General: Abdomen is flat.     Palpations: Abdomen is soft.  Musculoskeletal:        General: Normal range of motion.     Cervical back: Normal range of motion and neck supple.     Comments: Right calf pain and tenderness.  Left calf with no tenderness  Skin:    General: Skin is warm.     Capillary Refill: Capillary refill takes less than 2 seconds.  Neurological:     General: No focal deficit present.     Mental Status: He is alert and oriented to person, place, and time.  Psychiatric:        Mood and Affect: Mood normal.        Behavior: Behavior normal.     ED Results / Procedures / Treatments   Labs (all labs ordered are listed, but only abnormal results are displayed) Labs Reviewed  CBC - Abnormal; Notable for the following components:      Result Value   Hemoglobin 12.7 (*)    All other components within normal limits  BASIC METABOLIC PANEL  TROPONIN I (HIGH SENSITIVITY)    EKG EKG Interpretation  Date/Time:  Wednesday May 10 2020 20:11:37 EST Ventricular Rate:  91 PR Interval:  204 QRS Duration: 92 QT Interval:  382 QTC Calculation: 469 R Axis:   11 Text Interpretation: Sinus rhythm with frequent Premature ventricular complexes Otherwise normal ECG PVC new since previous Confirmed by Wandra Arthurs [phone]) on 05/10/2020 10:34:53 PM   Radiology CT Angio Chest PE W and/or Wo Contrast  Result Date: 05/10/2020 CLINICAL DATA:  Short of breath EXAM: CT ANGIOGRAPHY CHEST WITH CONTRAST TECHNIQUE: Multidetector CT imaging of the chest was performed using the standard protocol during bolus administration of intravenous contrast. Multiplanar CT image reconstructions and MIPs were obtained to  evaluate the vascular anatomy. CONTRAST:  38mL OMNIPAQUE IOHEXOL 350 MG/ML SOLN COMPARISON:  04/24/2020 FINDINGS: Cardiovascular: This is a technically adequate evaluation of the pulmonary vasculature. There are bilateral pulmonary emboli. Lobar emboli are seen within  the right middle and right lower lobes. Segmental emboli are seen within the left lower lobe. Borderline dilatation of the right ventricle, with RV/LV ratio measuring 1.1. Heart is unremarkable without pericardial effusion. Normal caliber of the thoracic aorta without evidence of dissection. Mediastinum/Nodes: No enlarged mediastinal, hilar, or axillary lymph nodes. Thyroid gland, trachea, and esophagus demonstrate no significant findings. Lungs/Pleura: No airspace disease, effusion, or pneumothorax. Central airways are patent. Upper Abdomen: No acute abnormality. Musculoskeletal: No acute or destructive bony lesions. Reconstructed images demonstrate no additional findings. Review of the MIP images confirms the above findings. IMPRESSION: 1. Bilateral pulmonary emboli, most pronounced in the right middle and right lower lobes. Positive for acute PE with CTevidence of right heart strain (RV/LV Ratio = 1.1) consistent with at least submassive (intermediate risk) PE. The presence of right heart strain has been associated with an increased risk of morbidity and mortality. Critical Value/emergent results were called by telephone at the time of interpretation on 05/10/2020 at 10:17 pm to provider Treon Kehl , who verbally acknowledged these results. Electronically Signed   By: Randa Ngo M.D.   On: 05/10/2020 22:22    Procedures Procedures   CRITICAL CARE Performed by: Wandra Arthurs   Total critical care time: 30 minutes  Critical care time was exclusive of separately billable procedures and treating other patients.  Critical care was necessary to treat or prevent imminent or life-threatening deterioration.  Critical care was time spent  personally by me on the following activities: development of treatment plan with patient and/or surrogate as well as nursing, discussions with consultants, evaluation of patient's response to treatment, examination of patient, obtaining history from patient or surrogate, ordering and performing treatments and interventions, ordering and review of laboratory studies, ordering and review of radiographic studies, pulse oximetry and re-evaluation of patient's condition.  Medications Ordered in ED Medications  iohexol (OMNIPAQUE) 350 MG/ML injection 50 mL (50 mLs Intravenous Contrast Given 05/10/20 2201)    ED Course  I have reviewed the triage vital signs and the nursing notes.  Pertinent labs & imaging results that were available during my care of the patient were reviewed by me and considered in my medical decision making (see chart for details).    MDM Rules/Calculators/A&P                         Scott Schneider is a 80 y.o. male here presenting with shortness of breath.  Patient was recently admitted for NSTEMI.  Patient had right leg swelling for several days and now have shortness of breath.  Patient has no oxygen requirement is not hypotensive.  Since he had elevated D-dimer we will get a CTA chest to rule out PE.  11:00 PM CTA showed bilateral PE with some heart strain.  However clinically he has no oxygen requirement and is not tachycardic or hypotensive.  Plan to start him on heparin and patient will need admission for monitoring.  Hospitalist requested I talked to critical care since he has heart strain.  I discussed case with Dr. Earlie Server from critical care.  He reviewed patient's vital signs and CT.  He states that patient is stable to be admitted by the hospitalist and does not need to be in the ICU.   Final Clinical Impression(s) / ED Diagnoses Final diagnoses:  None    Rx / DC Orders ED Discharge Orders    None       Drenda Freeze, MD 05/10/20 2316

## 2020-05-10 NOTE — Progress Notes (Signed)
Cardiology Office Note:    Date:  05/10/2020   ID:  Scott Schneider, DOB 11/03/1940, MRN [id number]  PCP:  Janie Morning, DO  Cardiologist:  Elouise Munroe, MD  Electrophysiologist:  None   Referring MD: Janie Morning, DO   No chief complaint on file. post hospital follow up  History of Present Illness:    Scott Schneider is a 80 y.o. male with a hx of prostate cancer status post radiation therapy in the past and a history of spinal stenosis. He also has a history of a positive COVID test earlier in February. He was admitted 04/24/2020. The patient was walking to his mailbox when he became dizzy followed by shortness of breath followed by near syncope. He called his PCP who suggested he go to the emergency room. In the emergency room his troponins were elevated in the 300-400 range. His EKG had nonspecific ST changes. He underwent diagnostic catheterization 04/25/2020 which showed normal coronaries and normal LV function. Echocardiogram showed an ejection fraction of 50 to 55%. There were PVCs noted. He had a moderately enlarged RV with moderately enlarged pulmonary pressures with a PA pressure of 46. The patient was placed on aspirin and Lipitor and metoprolol and discharged.  He is in the office today for follow-up. Since discharge he says he is done well. He denies any shortness of breath. He has not had any recurrent near syncopal spells. He has not had hemoptysis. He has had some painful swelling in his right calf which has improved.  Past Medical History:  Diagnosis Date  . H/O back injury 1980's  . Low back pain   . Other spondylosis, lumbar region   . Paresthesia of skin   . Prostate cancer ([place])   . Radiculopathy   . Spinal stenosis of lumbar region     Past Surgical History:  Procedure Laterality Date  . LEFT HEART CATH AND CORONARY ANGIOGRAPHY N/A 04/25/2020   Procedure: LEFT HEART CATH AND CORONARY ANGIOGRAPHY;  Surgeon: Martinique, Peter M, MD;  Location: [facility];   Service: Cardiovascular;  Laterality: N/A;  . PROSTATE BIOPSY    . TONSILLECTOMY     early 20's    Current Medications: Current Meds  Medication Sig  . acetaminophen (TYLENOL) 325 MG tablet Take 650 mg by mouth every 6 (six) hours as needed for mild pain.  Marland Kitchen aspirin EC 81 MG EC tablet Take 1 tablet (81 mg total) by mouth daily. Swallow whole.  Marland Kitchen atorvastatin (LIPITOR) 40 MG tablet Take 1 tablet (40 mg total) by mouth daily.  . brimonidine (ALPHAGAN) 0.2 % ophthalmic solution Place 1 drop into the right eye 2 (two) times daily.  Marland Kitchen gabapentin (NEURONTIN) 100 MG capsule Take 200 mg by mouth at bedtime. Take 1-2 every night  . metoprolol tartrate (LOPRESSOR) 25 MG tablet Take 0.5 tablets (12.5 mg total) by mouth 2 (two) times daily.  . tamsulosin (FLOMAX) 0.4 MG CAPS capsule Take 0.4 mg by mouth daily.  Marland Kitchen VITAMIN D PO Take 1 tablet by mouth daily.     Allergies:   Patient has no known allergies.   Social History   Socioeconomic History  . Marital status: Widowed    Spouse name: Not on file  . Number of children: 4  . Years of education: Not on file  . Highest education level: Bachelor's degree (e.g., BA, AB, BS)  Occupational History    Comment: retired  Tobacco Use  . Smoking status: Never Smoker  .  Smokeless tobacco: Never Used  Substance and Sexual Activity  . Alcohol use: No  . Drug use: No  . Sexual activity: Yes  Other Topics Concern  . Not on file  Social History Narrative   Lives alone   Social Determinants of Health   Financial Resource Strain: Not on file  Food Insecurity: Not on file  Transportation Needs: Not on file  Physical Activity: Not on file  Stress: Not on file  Social Connections: Not on file     Family History: The patient's family history includes Asthma in his sister; Cancer in his maternal grandmother, mother, and son; Cirrhosis in his father; Diabetes in his sister; Other in his brother.  ROS:   Please see the history of present illness.      All other systems reviewed and are negative.  EKGs/Labs/Other Studies Reviewed:    The following studies were reviewed today: Echo 04/26/2020- IMPRESSIONS    1. Frequent PVCs during study. Left ventricular ejection fraction, by  estimation, is 50 to 55%. The left ventricle has low normal function. Left  ventricular diastolic parameters are indeterminate.  2. Right ventricular systolic function is normal. The right ventricular  size is moderately enlarged. There is moderately elevated pulmonary artery  systolic pressure. The estimated right ventricular systolic pressure is  26.9 mmHg.  3. The mitral valve is normal in structure. No evidence of mitral valve  regurgitation.  4. Tricuspid valve regurgitation is mild to moderate.  5. The aortic valve is tricuspid. Aortic valve regurgitation is not  visualized. No aortic stenosis is present.  6. Aortic dilatation noted. There is mild dilatation of the ascending  aorta, measuring 39 mm.  7. The inferior vena cava is normal in size with greater than 50%  respiratory variability, suggesting right atrial pressure of 3 mmHg.   EKG:  EKG is not ordered today.  The ekg ordered 04/25/2020 demonstrates a poor tracing with an irregular baseline. Appears to be sinus rhythm with PACs. He has nonspecific ST changes.  Recent Labs: 12/08/2019: ALT 12 04/24/2020: Magnesium 2.0 04/25/2020: B Natriuretic Peptide 366.5; TSH 1.745 04/26/2020: BUN 10; Creatinine, Ser 1.26; Hemoglobin 11.9; Platelets 210; Potassium 3.9; Sodium 139  Recent Lipid Panel    Component Value Date/Time   CHOL 202 (H) 04/25/2020 0446   TRIG 95 04/25/2020 0446   HDL 44 04/25/2020 0446   CHOLHDL 4.6 04/25/2020 0446   VLDL 19 04/25/2020 0446   LDLCALC 139 (H) 04/25/2020 0446    Physical Exam:    VS:  BP 126/68   Pulse 68   Ht 5\' 10"  (1.778 m)   Wt 206 lb (93.4 kg)   SpO2 98%   BMI 29.56 kg/m     Wt Readings from Last 3 Encounters:  05/10/20 206 lb (93.4 kg)   04/26/20 201 lb 4.8 oz (91.3 kg)  12/08/19 205 lb 9.6 oz (93.3 kg)     GEN: Well nourished, well developed in no acute distress HEENT: Normal NECK: No JVD; No carotid bruits CARDIAC: RRR, no murmurs, rubs, gallops RESPIRATORY:  Clear to auscultation without rales, wheezing or rhonchi  ABDOMEN: Soft, non-tender, non-distended MUSCULOSKELETAL:  Rt calf edema; non tender,No deformity  SKIN: Warm and dry NEUROLOGIC:  Alert and oriented x 3 PSYCHIATRIC:  Normal affect   ASSESSMENT:    SOB and near syncope- Patient was admitted approximately 2 weeks after being exposed to Covid (with a positive test). He had near syncope and shortness of breath on admission. Troponins were elevated and  he was treated as an NSTEMI. Catheterization revealed normal coronaries. Echo showed normal LV function with RV dilatation. He has since developed some swelling in his right calf. I think he is at least moderate risk for pulmonary embolism. I urged the patient to consider CTA. He would like to take a more conservative approach, I offered him a D-dimer today with the understanding that if this is elevated he will need a chest CT as soon as possible. He has been asymptomatic since discharge and that is somewhat reassuring. PLAN:    STAT D-dimer, CTA ASAP if elevated.   Medication Adjustments/Labs and Tests Ordered: Current medicines are reviewed at length with the patient today.  Concerns regarding medicines are outlined above.  No orders of the defined types were placed in this encounter.  No orders of the defined types were placed in this encounter.   There are no Patient Instructions on file for this visit.   Signed, Kerin Ransom, PA-C  05/10/2020 1:58 PM    [facility]

## 2020-05-11 ENCOUNTER — Observation Stay (HOSPITAL_COMMUNITY): Payer: Medicare (Managed Care)

## 2020-05-11 ENCOUNTER — Observation Stay (HOSPITAL_BASED_OUTPATIENT_CLINIC_OR_DEPARTMENT_OTHER): Payer: Medicare (Managed Care)

## 2020-05-11 DIAGNOSIS — M7989 Other specified soft tissue disorders: Secondary | ICD-10-CM

## 2020-05-11 DIAGNOSIS — I2699 Other pulmonary embolism without acute cor pulmonale: Secondary | ICD-10-CM | POA: Diagnosis not present

## 2020-05-11 DIAGNOSIS — R0602 Shortness of breath: Secondary | ICD-10-CM

## 2020-05-11 LAB — RESP PANEL BY RT-PCR (FLU A&B, COVID) ARPGX2
Influenza A by PCR: NEGATIVE
Influenza B by PCR: NEGATIVE
SARS Coronavirus 2 by RT PCR: NEGATIVE

## 2020-05-11 LAB — HEPARIN LEVEL (UNFRACTIONATED)
Heparin Unfractionated: 0.64 [IU]/mL (ref 0.30–0.70)
Heparin Unfractionated: 0.84 [IU]/mL — ABNORMAL HIGH (ref 0.30–0.70)

## 2020-05-11 LAB — ECHOCARDIOGRAM COMPLETE
AR max vel: 5.05 cm2
AV Area VTI: 6.66 cm2
AV Area mean vel: 5.25 cm2
AV Mean grad: 2 mmHg
AV Peak grad: 4.4 mmHg
Ao pk vel: 1.05 m/s
Area-P 1/2: 5.38 cm2
Height: 70 in
S' Lateral: 3.7 cm
Weight: 3296 [oz_av]

## 2020-05-11 LAB — TROPONIN I (HIGH SENSITIVITY): Troponin I (High Sensitivity): 6 ng/L

## 2020-05-11 MED ORDER — TAMSULOSIN HCL 0.4 MG PO CAPS
0.4000 mg | ORAL_CAPSULE | Freq: Every day | ORAL | Status: DC
  Administered 2020-05-11: 0.4 mg via ORAL
  Filled 2020-05-11: qty 1

## 2020-05-11 MED ORDER — METOPROLOL TARTRATE 12.5 MG HALF TABLET
12.5000 mg | ORAL_TABLET | Freq: Two times a day (BID) | ORAL | Status: DC
  Administered 2020-05-11 – 2020-05-12 (×3): 12.5 mg via ORAL
  Filled 2020-05-11 (×3): qty 1

## 2020-05-11 MED ORDER — GABAPENTIN 100 MG PO CAPS
200.0000 mg | ORAL_CAPSULE | Freq: Every day | ORAL | Status: DC
  Administered 2020-05-11: 200 mg via ORAL
  Filled 2020-05-11: qty 2

## 2020-05-11 MED ORDER — ASPIRIN EC 81 MG PO TBEC
81.0000 mg | DELAYED_RELEASE_TABLET | Freq: Every day | ORAL | Status: DC
  Administered 2020-05-11 – 2020-05-12 (×2): 81 mg via ORAL
  Filled 2020-05-11 (×2): qty 1

## 2020-05-11 MED ORDER — BRIMONIDINE TARTRATE 0.2 % OP SOLN
1.0000 [drp] | Freq: Two times a day (BID) | OPHTHALMIC | Status: DC
  Administered 2020-05-11 – 2020-05-12 (×3): 1 [drp] via OPHTHALMIC
  Filled 2020-05-11: qty 5

## 2020-05-11 MED ORDER — ATORVASTATIN CALCIUM 40 MG PO TABS
40.0000 mg | ORAL_TABLET | Freq: Every day | ORAL | Status: DC
  Administered 2020-05-11: 40 mg via ORAL
  Filled 2020-05-11: qty 1

## 2020-05-11 NOTE — ED Notes (Signed)
Pt up to bathroom. Steady gait.

## 2020-05-11 NOTE — ED Notes (Signed)
Resting, A/O, no distress. Heparin gtt infusing at 14cc/hr without difficulty.

## 2020-05-11 NOTE — Progress Notes (Signed)
  Echocardiogram 2D Echocardiogram has been performed.  Luisa Hart RDCS 05/11/2020, 9:23 AM

## 2020-05-11 NOTE — Progress Notes (Signed)
ANTICOAGULATION CONSULT NOTE - Initial Consult  Pharmacy Consult for heparin Indication: pulmonary embolus  No Known Allergies  Patient Measurements: Height: 5\' 10"  (177.8 cm) Weight: 93.4 kg (206 lb) IBW/kg (Calculated) : 73 Heparin Dosing Weight: 90kg  Vital Signs: Temp: 98.1 F (36.7 C) (03/03 0715) Temp Source: Oral (03/03 0715) BP: 125/85 (03/03 0715) Pulse Rate: 78 (03/03 0800)  Labs: Recent Labs    05/10/20 2011 05/11/20 0122 05/11/20 0742  HGB 12.7*  --   --   HCT 39.5  --   --   PLT 289  --   --   HEPARINUNFRC  --   --  0.64  CREATININE 1.07  --   --   TROPONINIHS  --  6  --     Estimated Creatinine Clearance: 65.3 mL/min (by C-G formula based on SCr of 1.07 mg/dL).   Medical History: Past Medical History:  Diagnosis Date  . CAD (coronary artery disease) 05/10/2020  . H/O back injury 1980's  . Hyperlipidemia 05/10/2020  . Low back pain   . NSTEMI (non-ST elevated myocardial infarction) ([place]) 04/24/2020  . Other spondylosis, lumbar region   . Paresthesia of skin   . Prostate cancer ([place])   . Radiculopathy   . Spinal stenosis of lumbar region   . Syncope and collapse 04/26/2020    Assessment: 80yo male w/ no PMH of DVT/PE c/o SOB, pain and swelling in RLL since yesterday. D-dimer found to be elevated at PCP office and CT reveals PE w/ evidence of RHS. Ultrasound scheduled today to assess for concurrent DVT. Heparin was started on 3/3, no infusion issues or bleeding complications noted, H/H and platelets normal.  3/3 0742 HL 0.64 (therapuetic x1)   Goal of Therapy:  Heparin level 0.3-0.7 units/ml Monitor platelets by anticoagulation protocol: Yes   Plan:  Continue heparin ggt at 1400 units/hr.  Confirmatory heparin level in 8 hours. Daily heparin level and CBC and daily while on heparin. Follow up transition to oral anticoagulation when able.   Jacobo Forest PharmD Candidate 2022 05/11/2020 8:20 AM

## 2020-05-11 NOTE — Progress Notes (Signed)
Lower extremity venous bilateral study completed.  Preliminary results relayed to Doristine Bosworth, MD at 11:05 am.   See CV Proc for preliminary results report.   Darlin Coco, RDMS, RVT

## 2020-05-11 NOTE — Progress Notes (Signed)
PT Cancellation Note  Patient Details Name: Scott Schneider MRN: [id number] DOB: [birth year]   Cancelled Treatment:    Reason Eval/Treat Not Completed: Patient not medically ready Pt with new PE and per protocol must be on heparin 24 hours prior to initiation of therapy. Will hold until pt medically appropriate.   Lou Miner, DPT  Acute Rehabilitation Services  Pager: [phone] Office: [phone]    Rudean Hitt 05/11/2020, 11:55 AM

## 2020-05-11 NOTE — ED Notes (Signed)
Phone report called to RN 2W, all questions answered.

## 2020-05-11 NOTE — Progress Notes (Signed)
OT Cancellation Note  Patient Details Name: SHAWN CARATTINI MRN: [id number] DOB: 1940-04-13   Cancelled Treatment:    Reason Eval/Treat Not Completed: Medical issues which prohibited therapy. Pt with new PE and RLE DVT; heparin started 3/3. Will assess when medically appropriate.   Tyheem Boughner,HILLARY 05/11/2020, 11:56 AM  Maurie Boettcher, OT/L   Acute OT Clinical Specialist Acute Rehabilitation Services Pager [phone] Office [phone]

## 2020-05-11 NOTE — Progress Notes (Signed)
PROGRESS NOTE    Scott Schneider  JQB:[id number] DOB: Mar 11, 1941 DOA: 05/10/2020 PCP: Janie Morning, DO   Brief Narrative:  HPI: Scott Schneider is a 80 y.o. male with medical history significant of back injury, chronic lower back pain, spondylosis of lumbar region, prostate cancer, CAD, recently admitted and discharged for syncope/collapse NSTEMI who is coming to the emergency department from his PCP office due to right lower extremity edema associated with dyspnea since yesterday.  His PCP obtained a D-dimer at the office which was over 4 and sent them to the emergency department for further evaluation.  CTA chest done in the ED showed bilateral PE.  He mentions that he has been very sedentary since he was discharged from the hospital.  He has been mostly seated or in bed.  He denies any travel history.  He denies fever, chills, rhinorrhea, sore throat, productive cough, wheezing or hemoptysis.  No chest pain, palpitations, diaphoresis, PND orthopnea.  Denies abdominal pain, nausea, vomiting, diarrhea, constipation, melena or hematochezia.  No dysuria, frequency or hematuria.  No polyuria, polydipsia, polyphagia or blurred vision.  ED Course: Initial vital signs were temperature 98.3 F, pulse 93, respiration 15, BP 152/91 mmHg O2 sat 100% on room air.  The patient was given supplemental oxygen and a heparin infusion was begun.   Assessment & Plan:   Principal Problem:   Bilateral pulmonary embolism (HCC) Active Problems:   Normocytic anemia   CAD (coronary artery disease)   Hyperlipidemia   Acute bilateral pulmonary embolism ([place]): Continue heparin drip for rest of the day, switch to [place] later tonight.  Follow-up Doppler lower extremity and transthoracic echo.  He is comfortable on room air.  Normocytic anemia: Hemoglobin is stable.  Follow.  Nonischemic CAD (coronary artery disease) Continue aspirin 81 mg p.o. daily. Continue statin. Resume metoprolol.    Hyperlipidemia Continue  atorvastatin 40 mg p.o. daily  Generalized weakness: Consult PT OT.   DVT prophylaxis:    Code Status: Full Code  Family Communication: None present at bedside.  Plan of care discussed with patient in length and he verbalized understanding and agreed with it.  Status is: Observation  The patient will require care spanning > 2 midnights and should be moved to inpatient because: Inpatient level of care appropriate due to severity of illness  Dispo: The patient is from: Home              Anticipated d/c is to: Home              Patient currently is not medically stable to d/c.   Difficult to place patient No        Estimated body mass index is 29.56 kg/m as calculated from the following:   Height as of this encounter: 5\' 10"  (1.778 m).   Weight as of this encounter: 93.4 kg.      Nutritional status:               Consultants:   None  Procedures:   None  Antimicrobials:  Anti-infectives (From admission, onward)   None         Subjective: Seen and examined, still in the ED.  Feels much better.  No complaints.  No shortness of breath or chest pain or any leg pain.  Objective: Vitals:   05/11/20 0730 05/11/20 0745 05/11/20 0800 05/11/20 1000  BP:    120/66  Pulse: 69 72 78 70  Resp: 15 14 18 16   Temp:  TempSrc:      SpO2: 98% 98% 97% 97%  Weight:      Height:        Intake/Output Summary (Last 24 hours) at 05/11/2020 1101 Last data filed at 05/11/2020 1014 Gross per 24 hour  Intake --  Output 450 ml  Net -450 ml   Filed Weights   05/10/20 2200  Weight: 93.4 kg    Examination:  General exam: Appears calm and comfortable  Respiratory system: Clear to auscultation. Respiratory effort normal. Cardiovascular system: S1 & S2 heard, RRR. No JVD, murmurs, rubs, gallops or clicks. No pedal edema. Gastrointestinal system: Abdomen is nondistended, soft and nontender. No organomegaly or masses felt. Normal bowel sounds heard. Central nervous  system: Alert and oriented. No focal neurological deficits. Extremities: Right lower extremity slightly edematous than the left lower extremity and calf tenderness in the right lower extremity. Skin: No rashes, lesions or ulcers Psychiatry: Judgement and insight appear normal. Mood & affect appropriate.    Data Reviewed: I have personally reviewed following labs and imaging studies  CBC: Recent Labs  Lab 05/10/20 2011  WBC 7.0  HGB 12.7*  HCT 39.5  MCV 88.8  PLT 774   Basic Metabolic Panel: Recent Labs  Lab 05/10/20 2011  NA 138  K 4.1  CL 103  CO2 24  GLUCOSE 82  BUN 8  CREATININE 1.07  CALCIUM 9.6   GFR: Estimated Creatinine Clearance: 65.3 mL/min (by C-G formula based on SCr of 1.07 mg/dL). Liver Function Tests: No results for input(s): AST, ALT, ALKPHOS, BILITOT, PROT, ALBUMIN in the last 168 hours. No results for input(s): LIPASE, AMYLASE in the last 168 hours. No results for input(s): AMMONIA in the last 168 hours. Coagulation Profile: No results for input(s): INR, PROTIME in the last 168 hours. Cardiac Enzymes: No results for input(s): CKTOTAL, CKMB, CKMBINDEX, TROPONINI in the last 168 hours. BNP (last 3 results) No results for input(s): PROBNP in the last 8760 hours. HbA1C: No results for input(s): HGBA1C in the last 72 hours. CBG: No results for input(s): GLUCAP in the last 168 hours. Lipid Profile: No results for input(s): CHOL, HDL, LDLCALC, TRIG, CHOLHDL, LDLDIRECT in the last 72 hours. Thyroid Function Tests: No results for input(s): TSH, T4TOTAL, FREET4, T3FREE, THYROIDAB in the last 72 hours. Anemia Panel: No results for input(s): VITAMINB12, FOLATE, FERRITIN, TIBC, IRON, RETICCTPCT in the last 72 hours. Sepsis Labs: No results for input(s): PROCALCITON, LATICACIDVEN in the last 168 hours.  Recent Results (from the past 240 hour(s))  Resp Panel by RT-PCR (Flu A&B, Covid) Nasopharyngeal Swab     Status: None   Collection Time: 05/10/20 11:22 PM    Specimen: Nasopharyngeal Swab; Nasopharyngeal(NP) swabs in vial transport medium  Result Value Ref Range Status   SARS Coronavirus 2 by RT PCR NEGATIVE NEGATIVE Final    Comment: (NOTE) SARS-CoV-2 target nucleic acids are NOT DETECTED.  The SARS-CoV-2 RNA is generally detectable in upper respiratory specimens during the acute phase of infection. The lowest concentration of SARS-CoV-2 viral copies this assay can detect is 138 copies/mL. A negative result does not preclude SARS-Cov-2 infection and should not be used as the sole basis for treatment or other patient management decisions. A negative result may occur with  improper specimen collection/handling, submission of specimen other than nasopharyngeal swab, presence of viral mutation(s) within the areas targeted by this assay, and inadequate number of viral copies(<138 copies/mL). A negative result must be combined with clinical observations, patient history, and epidemiological information. The  expected result is Negative.  Fact Sheet for Patients:  EntrepreneurPulse.com.au  Fact Sheet for Healthcare Providers:  IncredibleEmployment.be  This test is no t yet approved or cleared by the [place] FDA and  has been authorized for detection and/or diagnosis of SARS-CoV-2 by FDA under an Emergency Use Authorization (EUA). This EUA will remain  in effect (meaning this test can be used) for the duration of the COVID-19 declaration under Section 564(b)(1) of the Act, 21 U.S.C.section 360bbb-3(b)(1), unless the authorization is terminated  or revoked sooner.       Influenza A by PCR NEGATIVE NEGATIVE Final   Influenza B by PCR NEGATIVE NEGATIVE Final    Comment: (NOTE) The Xpert Xpress SARS-CoV-2/FLU/RSV plus assay is intended as an aid in the diagnosis of influenza from Nasopharyngeal swab specimens and should not be used as a sole basis for treatment. Nasal washings and aspirates are  unacceptable for Xpert Xpress SARS-CoV-2/FLU/RSV testing.  Fact Sheet for Patients: EntrepreneurPulse.com.au  Fact Sheet for Healthcare Providers: IncredibleEmployment.be  This test is not yet approved or cleared by the [place] FDA and has been authorized for detection and/or diagnosis of SARS-CoV-2 by FDA under an Emergency Use Authorization (EUA). This EUA will remain in effect (meaning this test can be used) for the duration of the COVID-19 declaration under Section 564(b)(1) of the Act, 21 U.S.C. section 360bbb-3(b)(1), unless the authorization is terminated or revoked.  Performed at [facility], [place] [address], [place], [place] [address]       Radiology Studies: CT Angio Chest PE W and/or Wo Contrast  Result Date: 05/10/2020 CLINICAL DATA:  Short of breath EXAM: CT ANGIOGRAPHY CHEST WITH CONTRAST TECHNIQUE: Multidetector CT imaging of the chest was performed using the standard protocol during bolus administration of intravenous contrast. Multiplanar CT image reconstructions and MIPs were obtained to evaluate the vascular anatomy. CONTRAST:  17mL OMNIPAQUE IOHEXOL 350 MG/ML SOLN COMPARISON:  04/24/2020 FINDINGS: Cardiovascular: This is a technically adequate evaluation of the pulmonary vasculature. There are bilateral pulmonary emboli. Lobar emboli are seen within the right middle and right lower lobes. Segmental emboli are seen within the left lower lobe. Borderline dilatation of the right ventricle, with RV/LV ratio measuring 1.1. Heart is unremarkable without pericardial effusion. Normal caliber of the thoracic aorta without evidence of dissection. Mediastinum/Nodes: No enlarged mediastinal, hilar, or axillary lymph nodes. Thyroid gland, trachea, and esophagus demonstrate no significant findings. Lungs/Pleura: No airspace disease, effusion, or pneumothorax. Central airways are patent. Upper Abdomen: No acute abnormality. Musculoskeletal:  No acute or destructive bony lesions. Reconstructed images demonstrate no additional findings. Review of the MIP images confirms the above findings. IMPRESSION: 1. Bilateral pulmonary emboli, most pronounced in the right middle and right lower lobes. Positive for acute PE with CTevidence of right heart strain (RV/LV Ratio = 1.1) consistent with at least submassive (intermediate risk) PE. The presence of right heart strain has been associated with an increased risk of morbidity and mortality. Critical Value/emergent results were called by telephone at the time of interpretation on 05/10/2020 at 10:17 pm to provider DAVID YAO , who verbally acknowledged these results. Electronically Signed   By: Randa Ngo M.D.   On: 05/10/2020 22:22    Scheduled Meds: . aspirin EC  81 mg Oral Daily  . atorvastatin  40 mg Oral QHS  . brimonidine  1 drop Right Eye BID  . gabapentin  200 mg Oral QHS  . metoprolol tartrate  12.5 mg Oral BID  . tamsulosin  0.4  mg Oral QPC supper   Continuous Infusions: . heparin 1,400 Units/hr (05/11/20 0859)     LOS: 0 days   Time spent: 34-minute   Darliss Cheney, MD [facility]  05/11/2020, 11:01 AM   To contact the attending provider between 7A-7P or the covering provider during after hours 7P-7A, please log into the web site [email or web address].

## 2020-05-11 NOTE — Progress Notes (Signed)
ANTICOAGULATION CONSULT NOTE - Follow Up Consult  Pharmacy Consult for heparin Indication: pulmonary embolus  No Known Allergies  Patient Measurements: Height: 5\' 10"  (177.8 cm) Weight: 93.4 kg (206 lb) IBW/kg (Calculated) : 73 Heparin Dosing Weight: 90kg  Vital Signs: Temp: 98.2 F (36.8 C) (03/03 1633) Temp Source: Oral (03/03 1633) BP: 129/68 (03/03 1633) Pulse Rate: 71 (03/03 1633)  Labs: Recent Labs    05/10/20 2011 05/11/20 0122 05/11/20 0742 05/11/20 1836  HGB 12.7*  --   --   --   HCT 39.5  --   --   --   PLT 289  --   --   --   HEPARINUNFRC  --   --  0.64 0.84*  CREATININE 1.07  --   --   --   TROPONINIHS  --  6  --   --     Estimated Creatinine Clearance: 65.3 mL/min (by C-G formula based on SCr of 1.07 mg/dL).   Medical History: Past Medical History:  Diagnosis Date  . CAD (coronary artery disease) 05/10/2020  . H/O back injury 1980's  . Hyperlipidemia 05/10/2020  . Low back pain   . NSTEMI (non-ST elevated myocardial infarction) ([place]) 04/24/2020  . Other spondylosis, lumbar region   . Paresthesia of skin   . Prostate cancer ([place])   . Radiculopathy   . Spinal stenosis of lumbar region   . Syncope and collapse 04/26/2020    Assessment: 80yo male w/ no PMH of DVT/PE c/o SOB, pain and swelling in RLL since yesterday. D-dimer found to be elevated at PCP office and CT reveals PE w/ evidence of RHS. Ultrasound scheduled today to assess for concurrent DVT. Heparin was started on 3/3, no infusion issues or bleeding complications noted, H/H and platelets normal.  Repeat HL this evening is now slightly supratherapeutic at 0.84 on 1400 units/hr. No bleeding reported   Goal of Therapy:  Heparin level 0.3-0.7 units/ml Monitor platelets by anticoagulation protocol: Yes   Plan:  Decrease heparin ggt to 1300 units/hr.  Heparin level in 8 hours. Daily heparin level and CBC and daily while on heparin. Follow up transition to oral anticoagulation when able.    Albertina Parr, PharmD., BCPS, BCCCP Clinical Pharmacist Please refer to [facility] for unit-specific pharmacist

## 2020-05-11 NOTE — Plan of Care (Signed)

## 2020-05-12 ENCOUNTER — Other Ambulatory Visit: Payer: Self-pay | Admitting: Family Medicine

## 2020-05-12 DIAGNOSIS — I2699 Other pulmonary embolism without acute cor pulmonale: Secondary | ICD-10-CM | POA: Diagnosis not present

## 2020-05-12 DIAGNOSIS — I82401 Acute embolism and thrombosis of unspecified deep veins of right lower extremity: Secondary | ICD-10-CM

## 2020-05-12 LAB — BASIC METABOLIC PANEL WITH GFR
Anion gap: 10 (ref 5–15)
BUN: 13 mg/dL (ref 8–23)
CO2: 22 mmol/L (ref 22–32)
Calcium: 9.3 mg/dL (ref 8.9–10.3)
Chloride: 105 mmol/L (ref 98–111)
Creatinine, Ser: 0.95 mg/dL (ref 0.61–1.24)
GFR, Estimated: >60 mL/min
Glucose, Bld: 96 mg/dL (ref 70–99)
Potassium: 4 mmol/L (ref 3.5–5.1)
Sodium: 137 mmol/L (ref 135–145)

## 2020-05-12 LAB — CBC
HCT: 37.5 % — ABNORMAL LOW (ref 39.0–52.0)
Hemoglobin: 12.3 g/dL — ABNORMAL LOW (ref 13.0–17.0)
MCH: 28.5 pg (ref 26.0–34.0)
MCHC: 32.8 g/dL (ref 30.0–36.0)
MCV: 87 fL (ref 80.0–100.0)
Platelets: 256 10*3/uL (ref 150–400)
RBC: 4.31 MIL/uL (ref 4.22–5.81)
RDW: 13 % (ref 11.5–15.5)
WBC: 5.5 10*3/uL (ref 4.0–10.5)
nRBC: 0 % (ref 0.0–0.2)

## 2020-05-12 LAB — HEPARIN LEVEL (UNFRACTIONATED): Heparin Unfractionated: 0.45 [IU]/mL (ref 0.30–0.70)

## 2020-05-12 MED ORDER — APIXABAN (ELIQUIS) VTE STARTER PACK (10MG AND 5MG): ORAL_TABLET | ORAL | 0 refills | Status: DC

## 2020-05-12 MED ORDER — APIXABAN 5 MG PO TABS: 5.0000 mg | ORAL_TABLET | Freq: Two times a day (BID) | ORAL | Status: DC

## 2020-05-12 MED ORDER — APIXABAN 5 MG PO TABS
10.0000 mg | ORAL_TABLET | Freq: Two times a day (BID) | ORAL | Status: DC
  Administered 2020-05-12: 10 mg via ORAL
  Filled 2020-05-12: qty 2

## 2020-05-12 MED FILL — ELIQUIS STARTER PACK 5 MG T: 5 | 30 days supply | Qty: 74 | Fill #0

## 2020-05-12 NOTE — Discharge Summary (Signed)
Physician Discharge Summary  [name] SEG:[id number] DOB: 03/30/1940 DOA: 05/10/2020  PCP: Janie Morning, DO  Admit date: 05/10/2020 Discharge date: 05/12/2020 30 Day Unplanned Readmission Risk Score   Flowsheet Row ED to Hosp-Admission (Discharged) from 04/24/2020 in [facility]  30 Day Unplanned Readmission Risk Score (%) 12.71 Filed at 04/26/2020 2000     This score is the patient's risk of an unplanned readmission within 30 days of being discharged (0 -100%). The score is based on dignosis, age, lab data, medications, orders, and past utilization.   Low:  0-14.9   Medium: 15-21.9   High: 22-29.9   Extreme: 30 and above         Admitted From: Home Disposition: Home  Recommendations for Outpatient Follow-up:  1. Follow up with PCP in 1-2 weeks 2. Please obtain BMP/CBC in one week 3. Please follow up with your PCP on the following pending results: Unresulted Labs (From admission, onward)         None        Home Health: None Equipment/Devices: None  Discharge Condition: Stable CODE STATUS: Full code Diet recommendation: Cardiac  Subjective: Seen and examined.  He has no complaints.  Wants to go home.  VPX:TGGYI [name] a 80 y.o.malewith medical history significant ofback injury, chronic lower back pain, spondylosis of lumbar region, prostate cancer, CAD, recently admitted and discharged for syncope/collapse NSTEMI who is coming to the emergency departmentfrom his PCP officedue toright lower extremity edema associated with dyspnea since yesterday. His PCP obtained a D-dimer at the office which was over 4 and sent them to the emergency department for further evaluation. CTA chest done in the ED showedbilateral PE. He mentions that he has been very sedentary since he was discharged from the hospital. He has been mostly seated or in bed. He denies any travel history. He denies fever, chills, rhinorrhea, sore throat, productive cough, wheezing or  hemoptysis. No chest pain, palpitations, diaphoresis, PND orthopnea. Denies abdominal pain, nausea, vomiting, diarrhea, constipation, melena or hematochezia. No dysuria, frequency or hematuria. No polyuria, polydipsia, polyphagia or blurred vision.  ED Course:Initial vital signs were temperature98.3 F, pulse 93, respiration 15, BP 152/91 mmHg O2 sat 100% on room air.The patient was given supplemental oxygen and a heparin infusion was begun.  Brief/Interim Summary: Patient was admitted to hospital service for acute bilateral PE.  He was started on heparin drip.  Transthoracic echo was done which did not show any wall motion abnormality.  Troponin within normal range.  Patient never was hypoxemic and did not require any oxygen.  Doppler lower extremity showed extensive right lower extremity DVT.  Patient was then evaluated by PT OT who felt that patient is back to his baseline and independent and did not recommend any further therapy.  Patient is doing fine without having any shortness of breath or chest pain and he is walking around without any hypoxia.  He was transition to Eliquis this morning.  He is being discharged home on Eliquis starter pack.  He was on aspirin prior to this hospitalization which was started by cardiology during recent hospitalization for nonischemic CAD since his cardiac cath was completely unremarkable.  I discussed with patient's cardiologist Dr. Margaretann Loveless and we both agree that now that patient is going to be started on Eliquis then we should discontinue his aspirin.  Discharge Diagnoses:  Principal Problem:   Bilateral pulmonary embolism (HCC) Active Problems:   Normocytic anemia   CAD (coronary artery disease)  Hyperlipidemia   Acute deep vein thrombosis (DVT) of right lower extremity [facility])    Discharge Instructions   Allergies as of 05/12/2020   No Known Allergies     Medication List    STOP taking these medications   aspirin 81 MG EC tablet     TAKE  these medications   acetaminophen 325 MG tablet Commonly known as: TYLENOL Take 650 mg by mouth every 6 (six) hours as needed for mild pain.   Apixaban Starter Pack (10mg  and 5mg ) Commonly known as: ELIQUIS STARTER PACK Take as directed on package: start with two-5mg  tablets twice daily for 7 days. On day 8, switch to one-5mg  tablet twice daily.   atorvastatin 40 MG tablet Commonly known as: LIPITOR Take 1 tablet (40 mg total) by mouth daily. What changed: when to take this   brimonidine 0.2 % ophthalmic solution Commonly known as: ALPHAGAN Place 1 drop into the right eye 2 (two) times daily.   gabapentin 100 MG capsule Commonly known as: NEURONTIN Take 200 mg by mouth at bedtime.   metoprolol tartrate 25 MG tablet Commonly known as: LOPRESSOR Take 0.5 tablets (12.5 mg total) by mouth 2 (two) times daily.   tamsulosin 0.4 MG Caps capsule Commonly known as: FLOMAX Take 0.4 mg by mouth daily after supper.   VITAMIN D PO Take 1 tablet by mouth daily.       Follow-up Information    Janie Morning, DO Follow up in 1 week(s).   Specialty: Family Medicine Contact information: [address] [place] [address] [phone]        Elouise Munroe, MD .   Specialties: Cardiology, Radiology Contact information: [address] [place] [address] [place] [address] [phone]              No Known Allergies  Consultations: None   Procedures/Studies: DG Chest 2 View  Result Date: 04/24/2020 CLINICAL DATA:  Dyspnea on exertion, left chest and arm pain, fell EXAM: CHEST - 2 VIEW COMPARISON:  None FINDINGS: The heart size and mediastinal contours are within normal limits. Both lungs are clear. The visualized skeletal structures are unremarkable. IMPRESSION: No active cardiopulmonary disease. Electronically Signed   By: Randa Ngo M.D.   On: 04/24/2020 16:35   CT Angio Chest PE W and/or Wo Contrast  Result Date: 05/10/2020 CLINICAL DATA:  Short  of breath EXAM: CT ANGIOGRAPHY CHEST WITH CONTRAST TECHNIQUE: Multidetector CT imaging of the chest was performed using the standard protocol during bolus administration of intravenous contrast. Multiplanar CT image reconstructions and MIPs were obtained to evaluate the vascular anatomy. CONTRAST:  81mL OMNIPAQUE IOHEXOL 350 MG/ML SOLN COMPARISON:  04/24/2020 FINDINGS: Cardiovascular: This is a technically adequate evaluation of the pulmonary vasculature. There are bilateral pulmonary emboli. Lobar emboli are seen within the right middle and right lower lobes. Segmental emboli are seen within the left lower lobe. Borderline dilatation of the right ventricle, with RV/LV ratio measuring 1.1. Heart is unremarkable without pericardial effusion. Normal caliber of the thoracic aorta without evidence of dissection. Mediastinum/Nodes: No enlarged mediastinal, hilar, or axillary lymph nodes. Thyroid gland, trachea, and esophagus demonstrate no significant findings. Lungs/Pleura: No airspace disease, effusion, or pneumothorax. Central airways are patent. Upper Abdomen: No acute abnormality. Musculoskeletal: No acute or destructive bony lesions. Reconstructed images demonstrate no additional findings. Review of the MIP images confirms the above findings. IMPRESSION: 1. Bilateral pulmonary emboli, most pronounced in the right middle and right lower lobes. Positive for acute PE with CTevidence  of right heart strain (RV/LV Ratio = 1.1) consistent with at least submassive (intermediate risk) PE. The presence of right heart strain has been associated with an increased risk of morbidity and mortality. Critical Value/emergent results were called by telephone at the time of interpretation on 05/10/2020 at 10:17 pm to provider DAVID YAO , who verbally acknowledged these results. Electronically Signed   By: Randa Ngo M.D.   On: 05/10/2020 22:22   CARDIAC CATHETERIZATION  Result Date: 04/25/2020  The left ventricular systolic  function is normal.  LV end diastolic pressure is normal.  The left ventricular ejection fraction is 55-65% by visual estimate.  1. No significant coronary artery disease 2. Normal LV function 3. Normal LVEDP Plan: medical management.   ECHOCARDIOGRAM COMPLETE  Result Date: 05/11/2020    ECHOCARDIOGRAM REPORT   Patient Name:   Scott Schneider Date of Exam: 05/11/2020 Medical Rec #:  [id number]     Height:       70.0 in Accession #:    [id number]    Weight:       206.0 lb Date of Birth:  01-28-1941     BSA:          2.114 m Patient Age:    21 years      BP:           99/46 mmHg Patient Gender: M             HR:           75 bpm. Exam Location:  Inpatient Procedure: 2D Echo, 3D Echo, Cardiac Doppler, Color Doppler and Strain Analysis Indications:    SOB  History:        Patient has prior history of Echocardiogram examinations, most                 recent 04/26/2020. CAD and Previous Myocardial Infarction,                 Signs/Symptoms:Syncope; Risk Factors:Dyslipidemia.  Sonographer:    Luisa Hart RDCS Referring Phys: [phone] [place]  1. Left ventricular ejection fraction, by estimation, is 50 to 55%. The left ventricle has low normal function. The left ventricle has no regional wall motion abnormalities. There is mild concentric left ventricular hypertrophy. Left ventricular diastolic parameters are consistent with Grade I diastolic dysfunction (impaired relaxation).  2. Right ventricular systolic function is normal. The right ventricular size is mildly enlarged. There is mildly elevated pulmonary artery systolic pressure. The estimated right ventricular systolic pressure is 83.3 mmHg.  3. The mitral valve is normal in structure. No evidence of mitral valve regurgitation. No evidence of mitral stenosis.  4. Tricuspid valve regurgitation is mild to moderate.  5. The aortic valve is tricuspid. Aortic valve regurgitation is not visualized. No aortic stenosis is present.  6. Aortic dilatation  noted. There is mild dilatation of the ascending aorta, measuring 42 mm. Comparison(s): Compared to prior study on 04/26/20, the RV size is now mildly dilated. Otherwise, there is no significant change. FINDINGS  Left Ventricle: Left ventricular ejection fraction, by estimation, is 50 to 55%. The left ventricle has low normal function. The left ventricle has no regional wall motion abnormalities. The left ventricular internal cavity size was normal in size. There is mild concentric left ventricular hypertrophy. Left ventricular diastolic parameters are consistent with Grade I diastolic dysfunction (impaired relaxation). Right Ventricle: The right ventricular size is mildly enlarged. Right vetricular wall thickness was not well visualized. Right ventricular  systolic function is normal. There is mildly elevated pulmonary artery systolic pressure. The tricuspid regurgitant  velocity is 3.09 m/s, and with an assumed right atrial pressure of 3 mmHg, the estimated right ventricular systolic pressure is 54.5 mmHg. Left Atrium: Left atrial size was normal in size. Right Atrium: Right atrial size was normal in size. Pericardium: There is no evidence of pericardial effusion. Mitral Valve: The mitral valve is normal in structure. Mild mitral annular calcification. No evidence of mitral valve regurgitation. No evidence of mitral valve stenosis. Tricuspid Valve: The tricuspid valve is normal in structure. Tricuspid valve regurgitation is mild to moderate. Aortic Valve: The aortic valve is tricuspid. Aortic valve regurgitation is not visualized. No aortic stenosis is present. Aortic valve mean gradient measures 2.0 mmHg. Aortic valve peak gradient measures 4.4 mmHg. Aortic valve area, by VTI measures 6.66 cm. Pulmonic Valve: The pulmonic valve was normal in structure. Pulmonic valve regurgitation is trivial. Aorta: Aortic dilatation noted. There is mild dilatation of the ascending aorta, measuring 42 mm. IAS/Shunts: No atrial  level shunt detected by color flow Doppler.  LEFT VENTRICLE PLAX 2D LVIDd:         5.20 cm  Diastology LVIDs:         3.70 cm  LV e' medial:    4.68 cm/s LV PW:         1.20 cm  LV E/e' medial:  13.1 LV IVS:        1.10 cm  LV e' lateral:   8.05 cm/s LVOT diam:     2.75 cm  LV E/e' lateral: 7.6 LV SV:         110 LV SV Index:   52 LVOT Area:     5.94 cm  RIGHT VENTRICLE RV S prime:     19.20 cm/s TAPSE (M-mode): 2.6 cm AORTIC VALVE                   PULMONIC VALVE AV Area (Vmax):    5.05 cm    PV Vmax:       0.96 m/s AV Area (Vmean):   5.25 cm    PV Vmean:      71.900 cm/s AV Area (VTI):     6.66 cm    PV VTI:        0.202 m AV Vmax:           105.00 cm/s PV Peak grad:  3.7 mmHg AV Vmean:          70.200 cm/s PV Mean grad:  2.0 mmHg AV VTI:            0.165 m AV Peak Grad:      4.4 mmHg AV Mean Grad:      2.0 mmHg LVOT Vmax:         89.20 cm/s LVOT Vmean:        62.000 cm/s LVOT VTI:          0.185 m LVOT/AV VTI ratio: 1.12  AORTA Ao Root diam: 3.60 cm Ao Asc diam:  4.10 cm MITRAL VALVE               TRICUSPID VALVE MV Area (PHT): 5.38 cm    TR Peak grad:   38.2 mmHg MV Decel Time: 141 msec    TR Vmax:        309.00 cm/s MV E velocity: 61.30 cm/s MV A velocity: 87.50 cm/s  SHUNTS MV E/A ratio:  0.70  Systemic VTI:  0.18 m                            Systemic Diam: 2.75 cm Gwyndolyn Kaufman MD Electronically signed by Gwyndolyn Kaufman MD Signature Date/Time: 05/11/2020/11:33:15 AM    Final    ECHOCARDIOGRAM COMPLETE  Result Date: 04/26/2020    ECHOCARDIOGRAM REPORT   Patient Name:   Scott Schneider Date of Exam: 04/26/2020 Medical Rec #:  [id number]     Height:       70.0 in Accession #:    [id number]    Weight:       201.3 lb Date of Birth:  1940-09-30     BSA:          2.093 m Patient Age:    37 years      BP:           111/59 mmHg Patient Gender: M             HR:           72 bpm. Exam Location:  Inpatient Procedure: 2D Echo Indications:    NSTEMI  History:        Patient has no prior history of  Echocardiogram examinations.  Sonographer:    Johny Chess Referring Phys: [place]  1. Frequent PVCs during study. Left ventricular ejection fraction, by estimation, is 50 to 55%. The left ventricle has low normal function. Left ventricular diastolic parameters are indeterminate.  2. Right ventricular systolic function is normal. The right ventricular size is moderately enlarged. There is moderately elevated pulmonary artery systolic pressure. The estimated right ventricular systolic pressure is 13.2 mmHg.  3. The mitral valve is normal in structure. No evidence of mitral valve regurgitation.  4. Tricuspid valve regurgitation is mild to moderate.  5. The aortic valve is tricuspid. Aortic valve regurgitation is not visualized. No aortic stenosis is present.  6. Aortic dilatation noted. There is mild dilatation of the ascending aorta, measuring 39 mm.  7. The inferior vena cava is normal in size with greater than 50% respiratory variability, suggesting right atrial pressure of 3 mmHg. FINDINGS  Left Ventricle: Left ventricular ejection fraction, by estimation, is 50 to 55%. The left ventricle has low normal function. The left ventricle has no regional wall motion abnormalities. The left ventricular internal cavity size was normal in size. There is no left ventricular hypertrophy. Left ventricular diastolic parameters are indeterminate. Right Ventricle: The right ventricular size is moderately enlarged. Right vetricular wall thickness was not well visualized. Right ventricular systolic function is normal. There is moderately elevated pulmonary artery systolic pressure. The tricuspid regurgitant velocity is 3.31 m/s, and with an assumed right atrial pressure of 3 mmHg, the estimated right ventricular systolic pressure is 44.0 mmHg. Left Atrium: Left atrial size was normal in size. Right Atrium: Right atrial size was normal in size. Pericardium: There is no evidence of pericardial  effusion. Mitral Valve: The mitral valve is normal in structure. No evidence of mitral valve regurgitation. Tricuspid Valve: The tricuspid valve is normal in structure. Tricuspid valve regurgitation is mild to moderate. Aortic Valve: The aortic valve is tricuspid. Aortic valve regurgitation is not visualized. No aortic stenosis is present. Pulmonic Valve: The pulmonic valve was grossly normal. Pulmonic valve regurgitation is not visualized. Aorta: The aortic root is normal in size and structure and aortic dilatation noted. There is mild dilatation of the ascending aorta, measuring 39 mm.  Venous: The inferior vena cava is normal in size with greater than 50% respiratory variability, suggesting right atrial pressure of 3 mmHg. IAS/Shunts: No atrial level shunt detected by color flow Doppler.  LEFT VENTRICLE PLAX 2D LVIDd:         4.90 cm LVIDs:         3.00 cm LV PW:         1.00 cm LV IVS:        1.00 cm LVOT diam:     2.30 cm LVOT Area:     4.15 cm  IVC IVC diam: 1.80 cm LEFT ATRIUM             Index       RIGHT ATRIUM           Index LA diam:        3.50 cm 1.67 cm/m  RA Area:     16.70 cm LA Vol (A2C):   49.1 ml 23.46 ml/m RA Volume:   44.70 ml  21.36 ml/m LA Vol (A4C):   56.4 ml 26.95 ml/m LA Biplane Vol: 53.7 ml 25.66 ml/m   AORTA Ao Root diam: 3.40 cm Ao Asc diam:  3.90 cm TRICUSPID VALVE TR Peak grad:   43.8 mmHg TR Vmax:        331.00 cm/s  SHUNTS Systemic Diam: 2.30 cm Oswaldo Milian MD Electronically signed by Oswaldo Milian MD Signature Date/Time: 04/26/2020/4:12:16 PM    Final    VAS [place] LOWER EXTREMITY VENOUS (DVT) ([facility] 7a-7p)  Result Date: 05/11/2020  Lower Venous DVT Study Indications: Pulmonary embolism, and right leg swelling.  Anticoagulation: Heparin. Comparison Study: 05-10-2020 CTA chest showed bilateral acute PE. Performing Technologist: Darlin Coco RDMS, RVT  Examination Guidelines: A complete evaluation includes B-mode imaging, spectral Doppler, color Doppler,  and power Doppler as needed of all accessible portions of each vessel. Bilateral testing is considered an integral part of a complete examination. Limited examinations for reoccurring indications may be performed as noted. The reflux portion of the exam is performed with the patient in reverse Trendelenburg.  +---------+---------------+---------+-----------+----------+-------------------+ RIGHT    CompressibilityPhasicitySpontaneityPropertiesThrombus Aging      +---------+---------------+---------+-----------+----------+-------------------+ CFV      Partial        Yes      Yes                  Acute               +---------+---------------+---------+-----------+----------+-------------------+ SFJ      None           No       No                   Acute               +---------+---------------+---------+-----------+----------+-------------------+ FV Prox  None           No       No                   Acute               +---------+---------------+---------+-----------+----------+-------------------+ FV Mid   None           No       No                   Acute               +---------+---------------+---------+-----------+----------+-------------------+ FV  DistalNone           No       No                   Acute               +---------+---------------+---------+-----------+----------+-------------------+ PFV      None           No       No                   Acute               +---------+---------------+---------+-----------+----------+-------------------+ POP      None           No       No                   Acute               +---------+---------------+---------+-----------+----------+-------------------+ PTV      Partial        Yes      Yes                  Acute               +---------+---------------+---------+-----------+----------+-------------------+ PERO                                                  Not well visualized  +---------+---------------+---------+-----------+----------+-------------------+ Gastroc  None           No       No                   Acute               +---------+---------------+---------+-----------+----------+-------------------+   +---------+---------------+---------+-----------+----------+--------------+ LEFT     CompressibilityPhasicitySpontaneityPropertiesThrombus Aging +---------+---------------+---------+-----------+----------+--------------+ CFV      Full           Yes      Yes                                 +---------+---------------+---------+-----------+----------+--------------+ SFJ      Full                                                        +---------+---------------+---------+-----------+----------+--------------+ FV Prox  Full                                                        +---------+---------------+---------+-----------+----------+--------------+ FV Mid   Full                                                        +---------+---------------+---------+-----------+----------+--------------+ FV DistalFull                                                        +---------+---------------+---------+-----------+----------+--------------+  PFV      Full                                                        +---------+---------------+---------+-----------+----------+--------------+ POP      Full           Yes      Yes                                 +---------+---------------+---------+-----------+----------+--------------+ PTV      Full                                                        +---------+---------------+---------+-----------+----------+--------------+ PERO     Full                                                        +---------+---------------+---------+-----------+----------+--------------+     Summary: RIGHT: - Findings consistent with acute deep vein thrombosis involving the right common femoral  vein, SF junction, right femoral vein, right proximal profunda vein, right popliteal vein, right posterior tibial veins, and right gastrocnemius veins. - No cystic structure found in the popliteal fossa.  LEFT: - There is no evidence of deep vein thrombosis in the lower extremity.  - No cystic structure found in the popliteal fossa.  *See table(s) above for measurements and observations. Electronically signed by Monica Martinez MD on 05/11/2020 at 3:45:17 PM.    Final      Discharge Exam: Vitals:   05/12/20 0451 05/12/20 0822  BP: 117/61 117/79  Pulse: 71 84  Resp: 18 19  Temp: 98.5 F (36.9 C) 98.7 F (37.1 C)  SpO2: 96% 98%   Vitals:   05/11/20 2031 05/12/20 0034 05/12/20 0451 05/12/20 0822  BP: 131/68 107/68 117/61 117/79  Pulse: 69 83 71 84  Resp: 18 18 18 19   Temp: 98.6 F (37 C) 98 F (36.7 C) 98.5 F (36.9 C) 98.7 F (37.1 C)  TempSrc: Oral Oral Oral Oral  SpO2: 100% 94% 96% 98%  Weight:      Height:        General: Pt is alert, awake, not in acute distress Cardiovascular: RRR, S1/S2 +, no rubs, no gallops Respiratory: CTA bilaterally, no wheezing, no rhonchi Abdominal: Soft, NT, ND, bowel sounds + Extremities: no edema, no cyanosis    The results of significant diagnostics from this hospitalization (including imaging, microbiology, ancillary and laboratory) are listed below for reference.     Microbiology: Recent Results (from the past 240 hour(s))  Resp Panel by RT-PCR (Flu A&B, Covid) Nasopharyngeal Swab     Status: None   Collection Time: 05/10/20 11:22 PM   Specimen: Nasopharyngeal Swab; Nasopharyngeal(NP) swabs in vial transport medium  Result Value Ref Range Status   SARS Coronavirus 2 by RT PCR NEGATIVE NEGATIVE Final    Comment: (NOTE) SARS-CoV-2 target nucleic acids are NOT DETECTED.  The SARS-CoV-2 RNA is generally detectable in  upper respiratory specimens during the acute phase of infection. The lowest concentration of SARS-CoV-2 viral copies  this assay can detect is 138 copies/mL. A negative result does not preclude SARS-Cov-2 infection and should not be used as the sole basis for treatment or other patient management decisions. A negative result may occur with  improper specimen collection/handling, submission of specimen other than nasopharyngeal swab, presence of viral mutation(s) within the areas targeted by this assay, and inadequate number of viral copies(<138 copies/mL). A negative result must be combined with clinical observations, patient history, and epidemiological information. The expected result is Negative.  Fact Sheet for Patients:  EntrepreneurPulse.com.au  Fact Sheet for Healthcare Providers:  IncredibleEmployment.be  This test is no t yet approved or cleared by the [place] FDA and  has been authorized for detection and/or diagnosis of SARS-CoV-2 by FDA under an Emergency Use Authorization (EUA). This EUA will remain  in effect (meaning this test can be used) for the duration of the COVID-19 declaration under Section 564(b)(1) of the Act, 21 U.S.C.section 360bbb-3(b)(1), unless the authorization is terminated  or revoked sooner.       Influenza A by PCR NEGATIVE NEGATIVE Final   Influenza B by PCR NEGATIVE NEGATIVE Final    Comment: (NOTE) The Xpert Xpress SARS-CoV-2/FLU/RSV plus assay is intended as an aid in the diagnosis of influenza from Nasopharyngeal swab specimens and should not be used as a sole basis for treatment. Nasal washings and aspirates are unacceptable for Xpert Xpress SARS-CoV-2/FLU/RSV testing.  Fact Sheet for Patients: EntrepreneurPulse.com.au  Fact Sheet for Healthcare Providers: IncredibleEmployment.be  This test is not yet approved or cleared by the [place] FDA and has been authorized for detection and/or diagnosis of SARS-CoV-2 by FDA under an Emergency Use Authorization (EUA). This EUA  will remain in effect (meaning this test can be used) for the duration of the COVID-19 declaration under Section 564(b)(1) of the Act, 21 U.S.C. section 360bbb-3(b)(1), unless the authorization is terminated or revoked.  Performed at [facility], [place] [address] [place], [place] [address]      Labs: BNP (last 3 results) Recent Labs    04/25/20 0446  BNP 716.9*   Basic Metabolic Panel: Recent Labs  Lab 05/10/20 2011 05/12/20 0350  NA 138 137  K 4.1 4.0  CL 103 105  CO2 24 22  GLUCOSE 82 96  BUN 8 13  CREATININE 1.07 0.95  CALCIUM 9.6 9.3   Liver Function Tests: No results for input(s): AST, ALT, ALKPHOS, BILITOT, PROT, ALBUMIN in the last 168 hours. No results for input(s): LIPASE, AMYLASE in the last 168 hours. No results for input(s): AMMONIA in the last 168 hours. CBC: Recent Labs  Lab 05/10/20 2011 05/12/20 0350  WBC 7.0 5.5  HGB 12.7* 12.3*  HCT 39.5 37.5*  MCV 88.8 87.0  PLT 289 256   Cardiac Enzymes: No results for input(s): CKTOTAL, CKMB, CKMBINDEX, TROPONINI in the last 168 hours. BNP: Invalid input(s): POCBNP CBG: No results for input(s): GLUCAP in the last 168 hours. D-Dimer Recent Labs    05/10/20 1431  DDIMER >4.40*   Hgb A1c No results for input(s): HGBA1C in the last 72 hours. Lipid Profile No results for input(s): CHOL, HDL, LDLCALC, TRIG, CHOLHDL, LDLDIRECT in the last 72 hours. Thyroid function studies No results for input(s): TSH, T4TOTAL, T3FREE, THYROIDAB in the last 72 hours.  Invalid input(s): FREET3 Anemia work up No results for input(s): VITAMINB12, FOLATE, FERRITIN, TIBC, IRON, RETICCTPCT in the last 49  hours. Urinalysis No results found for: COLORURINE, APPEARANCEUR, [place], [place], GLUCOSEU, [place], [place], [place], PROTEINUR, UROBILINOGEN, NITRITE, LEUKOCYTESUR Sepsis Labs Invalid input(s): PROCALCITONIN,  WBC,  LACTICIDVEN Microbiology Recent Results (from the past 240 hour(s))  Resp Panel by RT-PCR  (Flu A&B, Covid) Nasopharyngeal Swab     Status: None   Collection Time: 05/10/20 11:22 PM   Specimen: Nasopharyngeal Swab; Nasopharyngeal(NP) swabs in vial transport medium  Result Value Ref Range Status   SARS Coronavirus 2 by RT PCR NEGATIVE NEGATIVE Final    Comment: (NOTE) SARS-CoV-2 target nucleic acids are NOT DETECTED.  The SARS-CoV-2 RNA is generally detectable in upper respiratory specimens during the acute phase of infection. The lowest concentration of SARS-CoV-2 viral copies this assay can detect is 138 copies/mL. A negative result does not preclude SARS-Cov-2 infection and should not be used as the sole basis for treatment or other patient management decisions. A negative result may occur with  improper specimen collection/handling, submission of specimen other than nasopharyngeal swab, presence of viral mutation(s) within the areas targeted by this assay, and inadequate number of viral copies(<138 copies/mL). A negative result must be combined with clinical observations, patient history, and epidemiological information. The expected result is Negative.  Fact Sheet for Patients:  EntrepreneurPulse.com.au  Fact Sheet for Healthcare Providers:  IncredibleEmployment.be  This test is no t yet approved or cleared by the [place] FDA and  has been authorized for detection and/or diagnosis of SARS-CoV-2 by FDA under an Emergency Use Authorization (EUA). This EUA will remain  in effect (meaning this test can be used) for the duration of the COVID-19 declaration under Section 564(b)(1) of the Act, 21 U.S.C.section 360bbb-3(b)(1), unless the authorization is terminated  or revoked sooner.       Influenza A by PCR NEGATIVE NEGATIVE Final   Influenza B by PCR NEGATIVE NEGATIVE Final    Comment: (NOTE) The Xpert Xpress SARS-CoV-2/FLU/RSV plus assay is intended as an aid in the diagnosis of influenza from Nasopharyngeal swab specimens  and should not be used as a sole basis for treatment. Nasal washings and aspirates are unacceptable for Xpert Xpress SARS-CoV-2/FLU/RSV testing.  Fact Sheet for Patients: EntrepreneurPulse.com.au  Fact Sheet for Healthcare Providers: IncredibleEmployment.be  This test is not yet approved or cleared by the [place] FDA and has been authorized for detection and/or diagnosis of SARS-CoV-2 by FDA under an Emergency Use Authorization (EUA). This EUA will remain in effect (meaning this test can be used) for the duration of the COVID-19 declaration under Section 564(b)(1) of the Act, 21 U.S.C. section 360bbb-3(b)(1), unless the authorization is terminated or revoked.  Performed at [facility], [place] [address] [place], [place] [address]      Time coordinating discharge: Over 30 minutes  SIGNED:   Darliss Cheney, MD  [facility] 05/12/2020, 10:16 AM  If 7PM-7AM, please contact night-coverage [email or web address]

## 2020-05-12 NOTE — Discharge Instructions (Signed)

## 2020-05-12 NOTE — Progress Notes (Signed)
OT Cancellation Note  Patient Details Name: Scott Schneider MRN: [id number] DOB: February 09, 1941   Cancelled Treatment:    Reason Eval/Treat Not Completed: OT screened, no needs identified, will sign off. Pt observed ambulating in hallway with PT. Per PT, pt overall Independent with no safety concerns noted. No formal OT eval needed  [name] 05/12/2020, 8:14 AM

## 2020-05-12 NOTE — Care Management Obs Status (Signed)
[facility] NOTIFICATION   Patient Details  Name: Scott Schneider MRN: [id number] Date of Birth: 05-Apr-1940   Medicare Observation Status Notification Given:  Yes    Angelita Ingles, RN 05/12/2020, 9:40 AM

## 2020-05-12 NOTE — Evaluation (Signed)
Physical Therapy Evaluation Patient Details Name: Scott Schneider MRN: [id number] DOB: 06-Oct-1940 Today's Date: 05/12/2020   History of Present Illness  80 y.o. male with medical history significant of back injury, chronic lower back pain, spondylosis of lumbar region, prostate cancer, CAD, recently admitted and discharged for syncope/collapse NSTEMI who is coming to the emergency department from his PCP office due to right lower extremity edema associated with dyspnea since yesterday. Chest CT revealed bilat PEs. Doppler revealed RLE DVT. Of note, pt tested positive for covid in early February 2022.    Clinical Impression  PT eval complete. Pt is I/mod I all functional mobility, including stairs. See below for further details. No further PT intervention indicated. PT signing off.    Follow Up Recommendations No PT follow up    Equipment Recommendations  None recommended by PT    Recommendations for Other Services       Precautions / Restrictions Precautions Precautions: None      Mobility  Bed Mobility Overal bed mobility: Independent                  Transfers Overall transfer level: Independent Equipment used: None                Ambulation/Gait Ambulation/Gait assistance: Modified independent (Device/Increase time) Gait Distance (Feet): 300 Feet Assistive device: None Gait Pattern/deviations: Step-through pattern;Decreased stride length Gait velocity: decreased Gait velocity interpretation: >2.62 ft/sec, indicative of community ambulatory General Gait Details: steady gait  Stairs Stairs: Yes Stairs assistance: Modified independent (Device/Increase time) Stair Management: One rail Right;Alternating pattern;Forwards Number of Stairs: 4 General stair comments: number of steps limited by IV pole  Wheelchair Mobility    Modified Rankin (Stroke Patients Only)       Balance Overall balance assessment: Mild deficits observed, not formally tested                                            Pertinent Vitals/Pain Pain Assessment: No/denies pain    Home Living Family/patient expects to be discharged to:: Private residence Living Arrangements: Alone   Type of Home: House Home Access: Stairs to enter Entrance Stairs-Rails: Right;Left;Can reach both Technical brewer of Steps: 7 Home Layout: One level Home Equipment: Shower seat      Prior Function Level of Independence: Independent         Comments: Drives     Hand Dominance        Extremity/Trunk Assessment   Upper Extremity Assessment Upper Extremity Assessment: Overall WFL for tasks assessed    Lower Extremity Assessment Lower Extremity Assessment: Overall WFL for tasks assessed    Cervical / Trunk Assessment Cervical / Trunk Assessment: Normal  Communication   Communication: No difficulties  Cognition Arousal/Alertness: Awake/alert Behavior During Therapy: WFL for tasks assessed/performed Overall Cognitive Status: Within Functional Limits for tasks assessed                                        General Comments      Exercises     Assessment/Plan    PT Assessment Patent does not need any further PT services  PT Problem List         PT Treatment Interventions      PT Goals (Current goals can be found in  the Care Plan section)  Acute Rehab PT Goals Patient Stated Goal: home PT Goal Formulation: All assessment and education complete, DC therapy    Frequency     Barriers to discharge        Co-evaluation               AM-PAC PT "6 Clicks" Mobility  Outcome Measure Help needed turning from your back to your side while in a flat bed without using bedrails?: None Help needed moving from lying on your back to sitting on the side of a flat bed without using bedrails?: None Help needed moving to and from a bed to a chair (including a wheelchair)?: None Help needed standing up from a chair using  your arms (e.g., wheelchair or bedside chair)?: None Help needed to walk in hospital room?: None Help needed climbing 3-5 steps with a railing? : None 6 Click Score: 24    End of Session Equipment Utilized During Treatment: Gait belt Activity Tolerance: Patient tolerated treatment well Patient left: in chair;with call bell/phone within reach Nurse Communication: Mobility status PT Visit Diagnosis: Difficulty in walking, not elsewhere classified (R26.2)    Time: 2330-0762 PT Time Calculation (min) (ACUTE ONLY): 13 min   Charges:   PT Evaluation $PT Eval Low Complexity: 1 Low          Lorrin Goodell, PT  Office # [phone] Pager [phone]   Lorriane Shire 05/12/2020, 9:13 AM

## 2020-05-12 NOTE — Progress Notes (Addendum)
ANTICOAGULATION CONSULT NOTE - Follow Up Consult  Pharmacy Consult for heparin Indication: pulmonary embolus  Labs: Recent Labs    05/10/20 2011 05/11/20 0122 05/11/20 0742 05/11/20 1836 05/12/20 0350  HGB 12.7*  --   --   --  12.3*  HCT 39.5  --   --   --  37.5*  PLT 289  --   --   --  256  HEPARINUNFRC  --   --  0.64 0.84* 0.45  CREATININE 1.07  --   --   --  0.95  TROPONINIHS  --  6  --   --   --     Assessment/Plan:  80yo male therapeutic on heparin after rate change. Will continue gtt at current rate of 1300 units/hr and confirm stable with additional level.   Wynona Neat, PharmD, BCPS  05/12/2020,4:49 AM   8:30 AM- Switched to Eliquis - 10 mg po BID x 7 days then 5 mg po BID  Thank you Anette Guarneri, PharmD

## 2020-08-15 ENCOUNTER — Other Ambulatory Visit: Payer: Self-pay

## 2020-08-15 ENCOUNTER — Encounter: Payer: Self-pay | Admitting: Internal Medicine

## 2020-08-15 ENCOUNTER — Ambulatory Visit (INDEPENDENT_AMBULATORY_CARE_PROVIDER_SITE_OTHER): Payer: Medicare (Managed Care) | Admitting: Internal Medicine

## 2020-08-15 VITALS — BP 110/66 | HR 83 | Ht 70.0 in | Wt 215.0 lb

## 2020-08-15 DIAGNOSIS — I82401 Acute embolism and thrombosis of unspecified deep veins of right lower extremity: Secondary | ICD-10-CM | POA: Diagnosis not present

## 2020-08-15 DIAGNOSIS — R778 Other specified abnormalities of plasma proteins: Secondary | ICD-10-CM

## 2020-08-15 DIAGNOSIS — R7989 Other specified abnormal findings of blood chemistry: Secondary | ICD-10-CM

## 2020-08-15 DIAGNOSIS — I2699 Other pulmonary embolism without acute cor pulmonale: Secondary | ICD-10-CM | POA: Diagnosis not present

## 2020-08-15 DIAGNOSIS — I493 Ventricular premature depolarization: Secondary | ICD-10-CM | POA: Diagnosis not present

## 2020-08-15 DIAGNOSIS — R55 Syncope and collapse: Secondary | ICD-10-CM

## 2020-08-15 DIAGNOSIS — E785 Hyperlipidemia, unspecified: Secondary | ICD-10-CM | POA: Diagnosis not present

## 2020-08-15 NOTE — Progress Notes (Signed)
Cardiology Office Note:    Date:  08/15/2020   ID:  Scott Schneider, DOB [birth year], MRN [id number]  PCP:  Janie Morning, DO  Cardiologist:  Elouise Munroe, MD  Electrophysiologist:  None   Referring MD: Janie Morning, DO   Chief Complaint/Reason for Referral: PE  History of Present Illness:    Scott Schneider is a 80 y.o. male with a history of prostate cancer status post radiation therapy in the past and a history of spinal stenosis. He also has a history of a positive COVID test earlier in February. He was admitted 04/24/2020. The patient was walking to his mailbox when he became dizzy followed by shortness of breath followed by near syncope. He called his PCP who suggested he go to the emergency room. In the emergency room his troponins were elevated in the 300-400 range. His EKG had nonspecific ST changes. He underwent diagnostic catheterization 04/25/2020 which showed normal coronaries and normal LV function. Echocardiogram showed an ejection fraction of 50 to 55%. There were PVCs noted. He had a moderately enlarged RV with moderately enlarged pulmonary pressures with a PA pressure of 46. The patient was placed on aspirin and Lipitor and metoprolol and discharged. Seen in follow up in March post hospital and noted right calf swelling. D-dimer elevated, found to have acute bilateral PE. Started on Eliquis. He has done well since dismissal with no bleeding. Residual right lower extremity swelling, side of prior DVT. Doing well on eliquis without bleeding, plans to continue for 6 mo.   The patient denies chest pain, chest pressure, dyspnea at rest or with exertion, palpitations, PND, orthopnea, or leg swelling. Denies cough, fever, chills. Denies nausea, vomiting. Denies syncope or presyncope. Denies dizziness or lightheadedness.   Past Medical History:  Diagnosis Date   CAD (coronary artery disease) 05/10/2020   H/O back injury 1980's   Hyperlipidemia 05/10/2020   Low back pain    NSTEMI  (non-ST elevated myocardial infarction) ([place]) 04/24/2020   Other spondylosis, lumbar region    Paresthesia of skin    Prostate cancer ([place])    Radiculopathy    Spinal stenosis of lumbar region    Syncope and collapse 04/26/2020    Past Surgical History:  Procedure Laterality Date   LEFT HEART CATH AND CORONARY ANGIOGRAPHY N/A 04/25/2020   Procedure: LEFT HEART CATH AND CORONARY ANGIOGRAPHY;  Surgeon: Martinique, Peter M, MD;  Location: [facility];  Service: Cardiovascular;  Laterality: N/A;   PROSTATE BIOPSY     TONSILLECTOMY     early 20's    Current Medications: Current Meds  Medication Sig   acetaminophen (TYLENOL) 325 MG tablet Take 650 mg by mouth every 6 (six) hours as needed for mild pain.   APIXABAN (ELIQUIS) VTE STARTER PACK (10MG  AND 5MG ) Take as directed on package: start with two-5mg  tablets twice daily for 7 days. On day 8, switch to one-5mg  tablet twice daily.   atorvastatin (LIPITOR) 40 MG tablet Take 1 tablet (40 mg total) by mouth daily.   brimonidine (ALPHAGAN) 0.2 % ophthalmic solution Place 1 drop into the right eye 2 (two) times daily.   gabapentin (NEURONTIN) 100 MG capsule Take 200 mg by mouth at bedtime.   metoprolol tartrate (LOPRESSOR) 25 MG tablet Take 0.5 tablets (12.5 mg total) by mouth 2 (two) times daily.   tamsulosin (FLOMAX) 0.4 MG CAPS capsule Take 0.4 mg by mouth daily after supper.   TURMERIC PO Take by mouth.   VITAMIN D PO Take  1 tablet by mouth daily.     Allergies:   Patient has no known allergies.   Social History   Tobacco Use   Smoking status: Never Smoker   Smokeless tobacco: Never Used  Substance Use Topics   Alcohol use: No   Drug use: No     Family History: The patient's family history includes Asthma in his sister; Cancer in his maternal grandmother, mother, and son; Cirrhosis in his father; Diabetes in his sister; Other in his brother.  ROS:   Please see the history of present illness.    All other systems reviewed and  are negative.  EKGs/Labs/Other Studies Reviewed:    The following studies were reviewed today:  EKG:  SR, 1st deg avb, frequent PVCs  I have independently reviewed the images from CTA chest 05/10/20 .  Recent Labs: 12/08/2019: ALT 12 04/24/2020: Magnesium 2.0 04/25/2020: B Natriuretic Peptide 366.5; TSH 1.745 05/12/2020: BUN 13; Creatinine, Ser 0.95; Hemoglobin 12.3; Platelets 256; Potassium 4.0; Sodium 137  Recent Lipid Panel    Component Value Date/Time   CHOL 202 (H) 04/25/2020 0446   TRIG 95 04/25/2020 0446   HDL 44 04/25/2020 0446   CHOLHDL 4.6 04/25/2020 0446   VLDL 19 04/25/2020 0446   LDLCALC 139 (H) 04/25/2020 0446    Physical Exam:    VS:  BP 110/66 (BP Location: Left Arm, Patient Position: Sitting, Cuff Size: Normal)   Pulse 83   Ht 5\' 10"  (1.778 m)   Wt 215 lb (97.5 kg)   BMI 30.85 kg/m     Wt Readings from Last 5 Encounters:  08/15/20 215 lb (97.5 kg)  05/10/20 206 lb (93.4 kg)  05/10/20 206 lb (93.4 kg)  04/26/20 201 lb 4.8 oz (91.3 kg)  12/08/19 205 lb 9.6 oz (93.3 kg)    Constitutional: No acute distress Eyes: sclera non-icteric, normal conjunctiva and lids ENMT: normal dentition, moist mucous membranes Cardiovascular: regular rhythm, normal rate, no murmurs. S1 and S2 normal. Radial pulses normal bilaterally. No jugular venous distention.  Respiratory: clear to auscultation bilaterally GI : normal bowel sounds, soft and nontender. No distention.   MSK: extremities warm, well perfused. No edema.  NEURO: grossly nonfocal exam, moves all extremities. PSYCH: alert and oriented x 3, normal mood and affect.   ASSESSMENT:    1. Bilateral pulmonary embolism ([place])   2. PVC's (premature ventricular contractions)   3. Elevated troponin   4. Syncope and collapse   5. Acute deep vein thrombosis (DVT) of right lower extremity, unspecified vein (HCC)   6. Hyperlipidemia, unspecified hyperlipidemia type    PLAN:    Bilateral pulmonary embolism ([place]) - Plan:  EKG 12-Lead, ECHOCARDIOGRAM COMPLETE Elevated troponin Syncope and collapse Acute deep vein thrombosis (DVT) of right lower extremity, unspecified vein (HCC) - continue eliquis 5 mg BID for at least 6 mo. Consider provoked PE but given burden will use for 6 mo - stopped ASA 81 mg daily in setting of DOAC and no significant CAD. Troponin likely from PE. - repeat echo at follow up to ensure return of right heart normal function.   Hyperlipidemia, unspecified hyperlipidemia type - continue atorvastatin 40 mg daily, LDL139. Recheck in 3-6 mo.   Total time of encounter: 30 minutes total time of encounter, including 20 minutes spent in face-to-face patient care on the date of this encounter. This time includes coordination of care and counseling regarding above mentioned problem list. Remainder of non-face-to-face time involved reviewing chart documents/testing relevant to the patient encounter  and documentation in the medical record. I have independently reviewed documentation from referring provider.   Cherlynn Kaiser, MD, [place]   Shared Decision Making/Informed Consent:       Medication Adjustments/Labs and Tests Ordered: Current medicines are reviewed at length with the patient today.  Concerns regarding medicines are outlined above.   Orders Placed This Encounter  Procedures   EKG 12-Lead   ECHOCARDIOGRAM COMPLETE    No orders of the defined types were placed in this encounter.   Patient Instructions  Medication Instructions:  No Changes In Medications at this time.  *If you need a refill on your cardiac medications before your next appointment, please call your pharmacy*  Testing/Procedures: Your physician has requested that you have an echocardiogram IN 3 MONTHS. Echocardiography is a painless test that uses sound waves to create images of your heart. It provides your doctor with information about the size and shape of your heart and how well your  heart's chambers and valves are working. You may receive an ultrasound enhancing agent through an IV if needed to better visualize your heart during the echo.This procedure takes approximately one hour. There are no restrictions for this procedure. This will take place at the [address].   Follow-Up: At [facility], you and your health needs are our priority.  As part of our continuing mission to provide you with exceptional heart care, we have created designated Provider Care Teams.  These Care Teams include your primary Cardiologist (physician) and Advanced Practice Providers (APPs -  Physician Assistants and Nurse Practitioners) who all work together to provide you with the care you need, when you need it.  Your next appointment:   AFTER ECHO  The format for your next appointment:   In Person  Provider:   Cherlynn Kaiser, MD

## 2020-08-15 NOTE — Patient Instructions (Signed)
Medication Instructions:  No Changes In Medications at this time.  *If you need a refill on your cardiac medications before your next appointment, please call your pharmacy*  Testing/Procedures: Your physician has requested that you have an echocardiogram IN 3 MONTHS. Echocardiography is a painless test that uses sound waves to create images of your heart. It provides your doctor with information about the size and shape of your heart and how well your heart's chambers and valves are working. You may receive an ultrasound enhancing agent through an IV if needed to better visualize your heart during the echo.This procedure takes approximately one hour. There are no restrictions for this procedure. This will take place at the [address].   Follow-Up: At [facility], you and your health needs are our priority.  As part of our continuing mission to provide you with exceptional heart care, we have created designated Provider Care Teams.  These Care Teams include your primary Cardiologist (physician) and Advanced Practice Providers (APPs -  Physician Assistants and Nurse Practitioners) who all work together to provide you with the care you need, when you need it.  Your next appointment:   AFTER ECHO  The format for your next appointment:   In Person  Provider:   Cherlynn Kaiser, MD

## 2020-11-02 ENCOUNTER — Other Ambulatory Visit: Payer: Self-pay | Admitting: Physician Assistant

## 2020-11-02 NOTE — Telephone Encounter (Signed)
Rx(s) sent to pharmacy electronically.  

## 2020-11-09 ENCOUNTER — Other Ambulatory Visit: Payer: Self-pay | Admitting: Family Medicine

## 2020-11-09 DIAGNOSIS — I82411 Acute embolism and thrombosis of right femoral vein: Secondary | ICD-10-CM

## 2020-11-09 DIAGNOSIS — I2609 Other pulmonary embolism with acute cor pulmonale: Secondary | ICD-10-CM

## 2020-11-16 ENCOUNTER — Ambulatory Visit (HOSPITAL_COMMUNITY): Payer: Medicare (Managed Care) | Attending: Cardiology

## 2020-11-16 ENCOUNTER — Other Ambulatory Visit: Payer: Self-pay

## 2020-11-16 DIAGNOSIS — D649 Anemia, unspecified: Secondary | ICD-10-CM | POA: Diagnosis not present

## 2020-11-16 DIAGNOSIS — I517 Cardiomegaly: Secondary | ICD-10-CM | POA: Diagnosis not present

## 2020-11-16 DIAGNOSIS — C61 Malignant neoplasm of prostate: Secondary | ICD-10-CM | POA: Insufficient documentation

## 2020-11-16 DIAGNOSIS — I2510 Atherosclerotic heart disease of native coronary artery without angina pectoris: Secondary | ICD-10-CM | POA: Insufficient documentation

## 2020-11-16 DIAGNOSIS — Z86711 Personal history of pulmonary embolism: Secondary | ICD-10-CM | POA: Insufficient documentation

## 2020-11-16 DIAGNOSIS — I252 Old myocardial infarction: Secondary | ICD-10-CM | POA: Insufficient documentation

## 2020-11-16 DIAGNOSIS — R55 Syncope and collapse: Secondary | ICD-10-CM | POA: Insufficient documentation

## 2020-11-16 DIAGNOSIS — E785 Hyperlipidemia, unspecified: Secondary | ICD-10-CM | POA: Insufficient documentation

## 2020-11-16 DIAGNOSIS — I493 Ventricular premature depolarization: Secondary | ICD-10-CM | POA: Insufficient documentation

## 2020-11-16 DIAGNOSIS — I2699 Other pulmonary embolism without acute cor pulmonale: Secondary | ICD-10-CM | POA: Insufficient documentation

## 2020-11-16 LAB — ECHOCARDIOGRAM COMPLETE
Area-P 1/2: 3.37 cm2
S' Lateral: 3.3 cm

## 2020-12-05 ENCOUNTER — Other Ambulatory Visit: Payer: Self-pay

## 2020-12-05 ENCOUNTER — Ambulatory Visit (HOSPITAL_COMMUNITY)
Admission: RE | Admit: 2020-12-05 | Discharge: 2020-12-05 | Disposition: A | Discharge status: Home / Self Care | Payer: Medicare (Managed Care) | Source: Ambulatory Visit | Attending: Family Medicine | Admitting: Family Medicine

## 2020-12-05 ENCOUNTER — Other Ambulatory Visit (HOSPITAL_COMMUNITY): Payer: Self-pay | Admitting: Family Medicine

## 2020-12-05 DIAGNOSIS — I2609 Other pulmonary embolism with acute cor pulmonale: Secondary | ICD-10-CM | POA: Diagnosis present

## 2020-12-05 LAB — POCT I-STAT CREATININE: Creatinine, Ser: 1.1 mg/dL (ref 0.61–1.24)

## 2020-12-05 MED ORDER — IOHEXOL 350 MG/ML SOLN
100.0000 mL | Freq: Once | INTRAVENOUS | Status: AC | PRN
  Administered 2020-12-05: 80 mL via INTRAVENOUS

## 2020-12-19 ENCOUNTER — Other Ambulatory Visit: Payer: Self-pay

## 2020-12-19 ENCOUNTER — Ambulatory Visit (INDEPENDENT_AMBULATORY_CARE_PROVIDER_SITE_OTHER): Payer: Medicare (Managed Care) | Admitting: Internal Medicine

## 2020-12-19 VITALS — BP 130/72 | HR 71 | Ht 70.0 in | Wt 213.6 lb

## 2020-12-19 DIAGNOSIS — Z79899 Other long term (current) drug therapy: Secondary | ICD-10-CM | POA: Diagnosis not present

## 2020-12-19 DIAGNOSIS — R55 Syncope and collapse: Secondary | ICD-10-CM

## 2020-12-19 DIAGNOSIS — I82401 Acute embolism and thrombosis of unspecified deep veins of right lower extremity: Secondary | ICD-10-CM

## 2020-12-19 DIAGNOSIS — R5383 Other fatigue: Secondary | ICD-10-CM

## 2020-12-19 DIAGNOSIS — I2699 Other pulmonary embolism without acute cor pulmonale: Secondary | ICD-10-CM

## 2020-12-19 DIAGNOSIS — E785 Hyperlipidemia, unspecified: Secondary | ICD-10-CM

## 2020-12-19 NOTE — Progress Notes (Signed)
Cardiology Office Note:    Date:  12/19/2020   ID:  Scott Schneider, DOB 1940-03-13, MRN [id number]  PCP:  Janie Morning, DO  Cardiologist:  Elouise Munroe, MD  Electrophysiologist:  None   Referring MD: Janie Morning, DO   Chief Complaint/Reason for Referral: PE, elevated troponin  History of Present Illness:    Scott Schneider is a 80 y.o. male with a history of prostate cancer status post radiation therapy in the past and a history of spinal stenosis. He also has a history of a positive COVID test earlier in February. He was admitted 04/24/2020. The patient was walking to his mailbox when he became dizzy followed by shortness of breath followed by near syncope. He called his PCP who suggested he go to the emergency room. In the emergency room his troponins were elevated in the 300-400 range. His EKG had nonspecific ST changes. He underwent diagnostic catheterization 04/25/2020 which showed normal coronaries and normal LV function. Echocardiogram showed an ejection fraction of 50 to 55%. There were PVCs noted. He had a moderately enlarged RV with moderately enlarged pulmonary pressures with a PA pressure of 46. The patient was placed on aspirin and Lipitor and metoprolol and discharged. Seen in follow up in March post hospital and noted right calf swelling. D-dimer elevated, found to have acute bilateral PE. Started on Eliquis. No bleeding. Residual right lower extremity swelling, side of prior DVT. Doing well on eliquis without bleeding, plans to continue for 6 mo.    No energy, feeling weak and tired. Discussed medications in detail. Discussed possibly stopping BB. The patient denies chest pain, chest pressure, dyspnea at rest or with exertion, palpitations, PND, orthopnea, or leg swelling. Denies cough, fever, chills. Denies nausea, vomiting. Denies syncope or presyncope. Denies dizziness or lightheadedness.  Past Medical History:  Diagnosis Date   CAD (coronary artery disease) 05/10/2020    H/O back injury 1980's   Hyperlipidemia 05/10/2020   Low back pain    NSTEMI (non-ST elevated myocardial infarction) ([place]) 04/24/2020   Other spondylosis, lumbar region    Paresthesia of skin    Prostate cancer ([place])    Radiculopathy    Spinal stenosis of lumbar region    Syncope and collapse 04/26/2020    Past Surgical History:  Procedure Laterality Date   LEFT HEART CATH AND CORONARY ANGIOGRAPHY N/A 04/25/2020   Procedure: LEFT HEART CATH AND CORONARY ANGIOGRAPHY;  Surgeon: Martinique, Peter M, MD;  Location: [facility];  Service: Cardiovascular;  Laterality: N/A;   PROSTATE BIOPSY     TONSILLECTOMY     early 20's    Current Medications: Current Meds  Medication Sig   acetaminophen (TYLENOL) 325 MG tablet Take 650 mg by mouth every 6 (six) hours as needed for mild pain.   APIXABAN (ELIQUIS) VTE STARTER PACK (10MG  AND 5MG ) Take as directed on package: start with two-5mg  tablets twice daily for 7 days. On day 8, switch to one-5mg  tablet twice daily.   atorvastatin (LIPITOR) 40 MG tablet TAKE 1 TABLET(40 MG) BY MOUTH DAILY   brimonidine (ALPHAGAN) 0.2 % ophthalmic solution Place 1 drop into the right eye 2 (two) times daily.   gabapentin (NEURONTIN) 100 MG capsule Take 200 mg by mouth at bedtime.   metoprolol tartrate (LOPRESSOR) 25 MG tablet TAKE 1/2 TABLET(12.5 MG) BY MOUTH TWICE DAILY     Allergies:   Patient has no known allergies.   Social History   Tobacco Use   Smoking status: Never  Smokeless tobacco: Never  Substance Use Topics   Alcohol use: No   Drug use: No     Family History: The patient's family history includes Asthma in his sister; Cancer in his maternal grandmother, mother, and son; Cirrhosis in his father; Diabetes in his sister; Other in his brother.  ROS:   Please see the history of present illness.    All other systems reviewed and are negative.  EKGs/Labs/Other Studies Reviewed:    The following studies were reviewed today:  EKG:   n/a  Imaging studies that I have independently reviewed today: CTA 12/05/2020, no recurrent PE, RV normal size.  Recent Labs: 04/24/2020: Magnesium 2.0 04/25/2020: B Natriuretic Peptide 366.5; TSH 1.745 05/12/2020: BUN 13; Hemoglobin 12.3; Platelets 256; Potassium 4.0; Sodium 137 12/05/2020: Creatinine, Ser 1.10  Recent Lipid Panel    Component Value Date/Time   CHOL 202 (H) 04/25/2020 0446   TRIG 95 04/25/2020 0446   HDL 44 04/25/2020 0446   CHOLHDL 4.6 04/25/2020 0446   VLDL 19 04/25/2020 0446   LDLCALC 139 (H) 04/25/2020 0446    Physical Exam:    VS:  BP 130/72   Pulse 71   Ht 5\' 10"  (1.778 m)   Wt 213 lb 9.6 oz (96.9 kg)   SpO2 98%   BMI 30.65 kg/m     Wt Readings from Last 5 Encounters:  12/19/20 213 lb 9.6 oz (96.9 kg)  08/15/20 215 lb (97.5 kg)  05/10/20 206 lb (93.4 kg)  05/10/20 206 lb (93.4 kg)  04/26/20 201 lb 4.8 oz (91.3 kg)    Constitutional: No acute distress Eyes: sclera non-icteric, normal conjunctiva and lids ENMT: normal dentition, moist mucous membranes Cardiovascular: regular rhythm, normal rate, no murmur. S1 and S2 normal. No jugular venous distention.  Respiratory: clear to auscultation bilaterally GI : normal bowel sounds, soft and nontender. No distention.   MSK: extremities warm, well perfused. No edema.  NEURO: grossly nonfocal exam, moves all extremities. PSYCH: alert and oriented x 3, normal mood and affect.   ASSESSMENT:    1. Fatigue, unspecified type   2. Bilateral pulmonary embolism ([place])   3. Syncope and collapse   4. Acute deep vein thrombosis (DVT) of right lower extremity, unspecified vein (HCC)   5. Hyperlipidemia, unspecified hyperlipidemia type   6. Medication management    PLAN:    Fatigue, unspecified type - hold metoprolol, no strong indication in absence of CAD. Monitor for improvement in symptoms. I've encouraged the patient to remain active in his community   Bilateral pulmonary embolism ([place]) Syncope and  collapse Acute deep vein thrombosis (DVT) of right lower extremity, unspecified vein (HCC) - Has completed 6 mo of anticoagulation with normal CTA and echo, can discontinue eliquis if no other indications for therapy.   Hyperlipidemia, unspecified hyperlipidemia type - continue atorvastatin 40 mg daily, recheck lipids with PCP  Medication management - as above.  Total time of encounter: 30 minutes total time of encounter, including 20 minutes spent in face-to-face patient care on the date of this encounter. This time includes coordination of care and counseling regarding above mentioned problem list. Remainder of non-face-to-face time involved reviewing chart documents/testing relevant to the patient encounter and documentation in the medical record. I have independently reviewed documentation from referring provider.   Cherlynn Kaiser, MD, [place]   Shared Decision Making/Informed Consent:       Medication Adjustments/Labs and Tests Ordered: Current medicines are reviewed at length with the patient today.  Concerns regarding medicines are outlined above.   No orders of the defined types were placed in this encounter.   No orders of the defined types were placed in this encounter.   Patient Instructions  Medication Instructions:  STOP METOPROLOL  *If you need a refill on your cardiac medications before your next appointment, please call your pharmacy*  Follow-Up: At [facility], you and your health needs are our priority.  As part of our continuing mission to provide you with exceptional heart care, we have created designated Provider Care Teams.  These Care Teams include your primary Cardiologist (physician) and Advanced Practice Providers (APPs -  Physician Assistants and Nurse Practitioners) who all work together to provide you with the care you need, when you need it.  Your next appointment:   6 month(s)  The format for your next appointment:    In Person  Provider:   Cherlynn Kaiser, MD

## 2020-12-19 NOTE — Patient Instructions (Signed)
Medication Instructions:  STOP METOPROLOL  *If you need a refill on your cardiac medications before your next appointment, please call your pharmacy*  Follow-Up: At [facility], you and your health needs are our priority.  As part of our continuing mission to provide you with exceptional heart care, we have created designated Provider Care Teams.  These Care Teams include your primary Cardiologist (physician) and Advanced Practice Providers (APPs -  Physician Assistants and Nurse Practitioners) who all work together to provide you with the care you need, when you need it.  Your next appointment:   6 month(s)  The format for your next appointment:   In Person  Provider:   Cherlynn Kaiser, MD

## 2021-01-30 ENCOUNTER — Telehealth: Payer: Self-pay

## 2021-01-30 NOTE — Telephone Encounter (Signed)
Attempted to call patient, left message for patient to call back to office.   

## 2021-01-30 NOTE — Telephone Encounter (Signed)
-----   Message from Elouise Munroe, MD sent at 01/29/2021 11:44 AM EST ----- Eliezer Lofts,  Will you call the patient and see if he has stopped his Eliquis? Had a repeat scan with no PE and has completed 6 mo of therapy, I don't think he has an indication for indefinite anticoagulation. If he says his primary doc is managing due to different information, that's fine. I'm just not sure if we talked about this at last follow up. GA

## 2021-02-14 NOTE — Telephone Encounter (Signed)
Attempted to call patient, left message for patient to call back to office.   

## 2021-02-15 NOTE — Telephone Encounter (Signed)
Patient returned call

## 2021-02-15 NOTE — Telephone Encounter (Signed)
Called pt. He report he is currently still taking Eliquis at the request of his pcp. He report his pcp did a scan on his inner right thigh and believed to have seen a clot so wanted to extend anticoagulant use. He state he has a follow up with Dr. Theda Sers this month.   Will forward to MD and nurse to make aware.

## 2021-05-14 DIAGNOSIS — M48061 Spinal stenosis, lumbar region without neurogenic claudication: Secondary | ICD-10-CM | POA: Diagnosis not present

## 2021-05-14 DIAGNOSIS — M47812 Spondylosis without myelopathy or radiculopathy, cervical region: Secondary | ICD-10-CM | POA: Diagnosis not present

## 2021-05-14 DIAGNOSIS — M542 Cervicalgia: Secondary | ICD-10-CM | POA: Diagnosis not present

## 2021-05-24 DIAGNOSIS — Z86711 Personal history of pulmonary embolism: Secondary | ICD-10-CM | POA: Diagnosis not present

## 2021-05-24 DIAGNOSIS — Z86718 Personal history of other venous thrombosis and embolism: Secondary | ICD-10-CM | POA: Diagnosis not present

## 2021-05-24 DIAGNOSIS — Z7901 Long term (current) use of anticoagulants: Secondary | ICD-10-CM | POA: Diagnosis not present

## 2021-06-01 DIAGNOSIS — M5137 Other intervertebral disc degeneration, lumbosacral region: Secondary | ICD-10-CM | POA: Diagnosis not present

## 2021-06-01 DIAGNOSIS — M5030 Other cervical disc degeneration, unspecified cervical region: Secondary | ICD-10-CM | POA: Diagnosis not present

## 2021-06-01 DIAGNOSIS — E785 Hyperlipidemia, unspecified: Secondary | ICD-10-CM | POA: Diagnosis not present

## 2021-06-01 DIAGNOSIS — Z86718 Personal history of other venous thrombosis and embolism: Secondary | ICD-10-CM | POA: Diagnosis not present

## 2021-06-01 DIAGNOSIS — G5791 Unspecified mononeuropathy of right lower limb: Secondary | ICD-10-CM | POA: Diagnosis not present

## 2021-06-01 DIAGNOSIS — R946 Abnormal results of thyroid function studies: Secondary | ICD-10-CM | POA: Diagnosis not present

## 2021-06-01 DIAGNOSIS — Z86711 Personal history of pulmonary embolism: Secondary | ICD-10-CM | POA: Diagnosis not present

## 2021-06-01 DIAGNOSIS — R7309 Other abnormal glucose: Secondary | ICD-10-CM | POA: Diagnosis not present

## 2021-06-01 DIAGNOSIS — Z79899 Other long term (current) drug therapy: Secondary | ICD-10-CM | POA: Diagnosis not present

## 2021-07-24 DIAGNOSIS — M48061 Spinal stenosis, lumbar region without neurogenic claudication: Secondary | ICD-10-CM | POA: Diagnosis not present

## 2021-07-24 DIAGNOSIS — M47812 Spondylosis without myelopathy or radiculopathy, cervical region: Secondary | ICD-10-CM | POA: Diagnosis not present

## 2021-08-20 DIAGNOSIS — M5416 Radiculopathy, lumbar region: Secondary | ICD-10-CM | POA: Diagnosis not present

## 2021-08-22 NOTE — Progress Notes (Signed)
Office Visit    Patient Name: Scott Schneider Date of Encounter: 08/23/2021  Primary Care Provider:  Janie Morning, DO Primary Cardiologist:  Elouise Munroe, MD  Chief Complaint    81 year old male with a history of near-syncope, hyperlipidemia, PVCs, PE, DVT, spinal stenosis, and prostate cancer who presents for follow-up related to pulmonary embolism.  Past Medical History    Past Medical History:  Diagnosis Date   CAD (coronary artery disease) 05/10/2020   H/O back injury 1980's   Hyperlipidemia 05/10/2020   Low back pain    NSTEMI (non-ST elevated myocardial infarction) ([place]) 04/24/2020   Other spondylosis, lumbar region    Paresthesia of skin    Prostate cancer ([place])    Radiculopathy    Spinal stenosis of lumbar region    Syncope and collapse 04/26/2020   Past Surgical History:  Procedure Laterality Date   LEFT HEART CATH AND CORONARY ANGIOGRAPHY N/A 04/25/2020   Procedure: LEFT HEART CATH AND CORONARY ANGIOGRAPHY;  Surgeon: Martinique, Peter M, MD;  Location: [place] [facility];  Service: Cardiovascular;  Laterality: N/A;   PROSTATE BIOPSY     TONSILLECTOMY     early 20's    Allergies  No Known Allergies  History of Present Illness    81 year old male with the above past medical history including near-syncope, hyperlipidemia, PVCs, PE, DVT, spinal stenosis, and prostate cancer.   He was hospitalized in February 2022 following a near syncopal episode, acute shortness of breath.  Troponin was elevated in the 300-400 range, EKG had nonspecific ST changes. He underwent cardiac catheterization which revealed normal coronaries and normal LV function.  Echocardiogram at the time showed EF 50 to 55%.  He was noted to have PVCs on telemetry.  He had a moderately enlarged RV with moderate and large pulmonary pressures with a PA pressure of 46. He was seen in follow-up in March posthospitalization and noted right calf swelling.  D-dimer was elevated, he was found to have acute  bilateral PEs, DVT.  He was started on Eliquis, therapy was recommended for a total of 6 months. Repeat echocardiogram in September 2022 showed EF 55 to 60%, normal LV function, no RWMA, G1 DD, right ventricular systolic function was not well visualized, RV size was normal.  He was seen in the office on 12/19/2020 and reported decreased energy, generalized weakness and fatigue.  Metoprolol and Eliquis were discontinued.  He presents today for follow-up.  Since his last visit stable from a cardiac standpoint.  He has noted over the past 77-month he has noted a "crawling sensation" on the left side of his face under his L eye notices a tingling that he describes as It current intermittently, last for seconds to minutes at a time, and resolve spontaneously. He has an upcoming appointment with ophthalmology for further evaluation.  Denies any further presyncope, syncope, denies palpitations, dizziness, chest pain, dyspnea, edema.  Overall, he reports feeling well and denies any additional concerns today.  Home Medications    Current Outpatient Medications  Medication Sig Dispense Refill   VITAMIN D PO Take 1 tablet by mouth daily.     acetaminophen (TYLENOL) 325 MG tablet Take 650 mg by mouth every 6 (six) hours as needed for mild pain.     No current facility-administered medications for this visit.     Review of Systems    He denies chest pain, palpitations, dyspnea, pnd, orthopnea, n, v, dizziness, syncope, edema, weight gain, or early satiety. All other systems  reviewed and are otherwise negative except as noted above.   Physical Exam    VS:  BP 122/69   Pulse 88   Ht '5\' 10"'$  (1.778 m)   Wt 201 lb 12.8 oz (91.5 kg)   SpO2 98%   BMI 28.96 kg/m   GEN: Well nourished, well developed, in no acute distress. HEENT: normal. Neck: Supple, no JVD, carotid bruits, or masses. Cardiac: RRR, no murmurs, rubs, or gallops. No clubbing, cyanosis, edema.  Radials/DP/PT 2+ and equal bilaterally.   Respiratory:  Respirations regular and unlabored, clear to auscultation bilaterally. GI: Soft, nontender, nondistended, BS + x 4. MS: no deformity or atrophy. Skin: warm and dry, no rash. Neuro:  Strength and sensation are intact. Psych: Normal affect.  Accessory Clinical Findings    ECG personally reviewed by me today -sinus rhythm with frequent PVCs, 88 bpm- no acute changes.  Lab Results  Component Value Date   WBC 5.5 05/12/2020   HGB 12.3 (L) 05/12/2020   HCT 37.5 (L) 05/12/2020   MCV 87.0 05/12/2020   PLT 256 05/12/2020   Lab Results  Component Value Date   CREATININE 1.10 12/05/2020   BUN 13 05/12/2020   NA 137 05/12/2020   K 4.0 05/12/2020   CL 105 05/12/2020   CO2 22 05/12/2020   Lab Results  Component Value Date   ALT 12 12/08/2019   AST 17 12/08/2019   ALKPHOS 58 12/08/2019   BILITOT 0.3 12/08/2019   Lab Results  Component Value Date   CHOL 202 (H) 04/25/2020   HDL 44 04/25/2020   LDLCALC 139 (H) 04/25/2020   TRIG 95 04/25/2020   CHOLHDL 4.6 04/25/2020    Lab Results  Component Value Date   HGBA1C 6.0 (H) 12/08/2019    Assessment & Plan    1. H/o PE/DVT/near syncope: Completed 18-monthcourse of Eliquis.  Denies any further presycope. Denies edema, chest pain, shortness of breath.  Stable with no sign of recurrence.  2. PVCs: Metoprolol was discontinued at last visit due to generalized weakness and fatigue.  EKG today shows sinus rhythm with frequent PVCs, 88 bpm.  He denies palpitations, dizziness, shortness of breath.  He is asymptomatic with PVCs.  For now, he wishes to remain off metoprolol.  3. Hyperlipidemia: Previously on Lipitor.  However, this was discontinued per shared decision making with his PCP.  Patient does not want to resume statin therapy at this time.  4. Disposition: Follow-up in 6 months with Dr. AMargaretann Loveless  ELenna Sciara NP 08/23/2021, 10:31 AM

## 2021-08-23 ENCOUNTER — Ambulatory Visit: Payer: Medicare Other | Admitting: Nurse Practitioner

## 2021-08-23 ENCOUNTER — Encounter: Payer: Self-pay | Admitting: Nurse Practitioner

## 2021-08-23 VITALS — BP 122/69 | HR 88 | Ht 70.0 in | Wt 201.8 lb

## 2021-08-23 DIAGNOSIS — Z86718 Personal history of other venous thrombosis and embolism: Secondary | ICD-10-CM | POA: Diagnosis not present

## 2021-08-23 DIAGNOSIS — Z86711 Personal history of pulmonary embolism: Secondary | ICD-10-CM

## 2021-08-23 DIAGNOSIS — E782 Mixed hyperlipidemia: Secondary | ICD-10-CM

## 2021-08-23 DIAGNOSIS — I493 Ventricular premature depolarization: Secondary | ICD-10-CM

## 2021-08-23 NOTE — Patient Instructions (Signed)
Medication Instructions:  Your physician recommends that you continue on your current medications as directed. Please refer to the Current Medication list given to you today.  *If you need a refill on your cardiac medications before your next appointment, please call your pharmacy*  Follow-Up: At [facility], you and your health needs are our priority.  As part of our continuing mission to provide you with exceptional heart care, we have created designated Provider Care Teams.  These Care Teams include your primary Cardiologist (physician) and Advanced Practice Providers (APPs -  Physician Assistants and Nurse Practitioners) who all work together to provide you with the care you need, when you need it.  We recommend signing up for the patient portal called "MyChart".  Sign up information is provided on this After Visit Summary.  MyChart is used to connect with patients for Virtual Visits (Telemedicine).  Patients are able to view lab/test results, encounter notes, upcoming appointments, etc.  Non-urgent messages can be sent to your provider as well.   To learn more about what you can do with MyChart, go to [email or web address].    Your next appointment:   6 month(s)  The format for your next appointment:   In Person  Provider:   Elouise Munroe, MD {    Important Information About Sugar

## 2021-08-27 DIAGNOSIS — H25811 Combined forms of age-related cataract, right eye: Secondary | ICD-10-CM | POA: Diagnosis not present

## 2021-08-27 DIAGNOSIS — H40023 Open angle with borderline findings, high risk, bilateral: Secondary | ICD-10-CM | POA: Diagnosis not present

## 2021-08-27 DIAGNOSIS — Z961 Presence of intraocular lens: Secondary | ICD-10-CM | POA: Diagnosis not present

## 2021-09-17 DIAGNOSIS — M5416 Radiculopathy, lumbar region: Secondary | ICD-10-CM | POA: Diagnosis not present

## 2021-09-17 DIAGNOSIS — M47812 Spondylosis without myelopathy or radiculopathy, cervical region: Secondary | ICD-10-CM | POA: Diagnosis not present

## 2021-09-17 DIAGNOSIS — M48061 Spinal stenosis, lumbar region without neurogenic claudication: Secondary | ICD-10-CM | POA: Diagnosis not present

## 2021-10-11 DIAGNOSIS — I82432 Acute embolism and thrombosis of left popliteal vein: Secondary | ICD-10-CM | POA: Diagnosis not present

## 2021-10-11 DIAGNOSIS — Z86711 Personal history of pulmonary embolism: Secondary | ICD-10-CM | POA: Diagnosis not present

## 2021-10-18 DIAGNOSIS — M5137 Other intervertebral disc degeneration, lumbosacral region: Secondary | ICD-10-CM | POA: Diagnosis not present

## 2021-10-18 DIAGNOSIS — L729 Follicular cyst of the skin and subcutaneous tissue, unspecified: Secondary | ICD-10-CM | POA: Diagnosis not present

## 2021-10-18 DIAGNOSIS — Z79899 Other long term (current) drug therapy: Secondary | ICD-10-CM | POA: Diagnosis not present

## 2021-10-18 DIAGNOSIS — L309 Dermatitis, unspecified: Secondary | ICD-10-CM | POA: Diagnosis not present

## 2021-10-18 DIAGNOSIS — Z86711 Personal history of pulmonary embolism: Secondary | ICD-10-CM | POA: Diagnosis not present

## 2021-10-18 DIAGNOSIS — I82432 Acute embolism and thrombosis of left popliteal vein: Secondary | ICD-10-CM | POA: Diagnosis not present

## 2021-10-30 DIAGNOSIS — R269 Unspecified abnormalities of gait and mobility: Secondary | ICD-10-CM | POA: Diagnosis not present

## 2021-10-30 DIAGNOSIS — M545 Low back pain, unspecified: Secondary | ICD-10-CM | POA: Diagnosis not present

## 2021-10-30 DIAGNOSIS — M25552 Pain in left hip: Secondary | ICD-10-CM | POA: Diagnosis not present

## 2021-10-30 DIAGNOSIS — M2560 Stiffness of unspecified joint, not elsewhere classified: Secondary | ICD-10-CM | POA: Diagnosis not present

## 2021-11-01 DIAGNOSIS — M25552 Pain in left hip: Secondary | ICD-10-CM | POA: Diagnosis not present

## 2021-11-01 DIAGNOSIS — M545 Low back pain, unspecified: Secondary | ICD-10-CM | POA: Diagnosis not present

## 2021-11-01 DIAGNOSIS — R269 Unspecified abnormalities of gait and mobility: Secondary | ICD-10-CM | POA: Diagnosis not present

## 2021-11-01 DIAGNOSIS — M2560 Stiffness of unspecified joint, not elsewhere classified: Secondary | ICD-10-CM | POA: Diagnosis not present

## 2021-11-02 DIAGNOSIS — M545 Low back pain, unspecified: Secondary | ICD-10-CM | POA: Diagnosis not present

## 2021-11-02 DIAGNOSIS — M2560 Stiffness of unspecified joint, not elsewhere classified: Secondary | ICD-10-CM | POA: Diagnosis not present

## 2021-11-02 DIAGNOSIS — M25552 Pain in left hip: Secondary | ICD-10-CM | POA: Diagnosis not present

## 2021-11-02 DIAGNOSIS — R269 Unspecified abnormalities of gait and mobility: Secondary | ICD-10-CM | POA: Diagnosis not present

## 2021-11-06 DIAGNOSIS — M545 Low back pain, unspecified: Secondary | ICD-10-CM | POA: Diagnosis not present

## 2021-11-06 DIAGNOSIS — M2560 Stiffness of unspecified joint, not elsewhere classified: Secondary | ICD-10-CM | POA: Diagnosis not present

## 2021-11-06 DIAGNOSIS — R269 Unspecified abnormalities of gait and mobility: Secondary | ICD-10-CM | POA: Diagnosis not present

## 2021-11-06 DIAGNOSIS — M25552 Pain in left hip: Secondary | ICD-10-CM | POA: Diagnosis not present

## 2021-11-07 DIAGNOSIS — H401122 Primary open-angle glaucoma, left eye, moderate stage: Secondary | ICD-10-CM | POA: Diagnosis not present

## 2021-11-07 DIAGNOSIS — H401113 Primary open-angle glaucoma, right eye, severe stage: Secondary | ICD-10-CM | POA: Diagnosis not present

## 2021-11-07 DIAGNOSIS — D492 Neoplasm of unspecified behavior of bone, soft tissue, and skin: Secondary | ICD-10-CM | POA: Diagnosis not present

## 2021-11-08 DIAGNOSIS — R269 Unspecified abnormalities of gait and mobility: Secondary | ICD-10-CM | POA: Diagnosis not present

## 2021-11-08 DIAGNOSIS — M25552 Pain in left hip: Secondary | ICD-10-CM | POA: Diagnosis not present

## 2021-11-08 DIAGNOSIS — M2560 Stiffness of unspecified joint, not elsewhere classified: Secondary | ICD-10-CM | POA: Diagnosis not present

## 2021-11-08 DIAGNOSIS — M545 Low back pain, unspecified: Secondary | ICD-10-CM | POA: Diagnosis not present

## 2021-11-13 DIAGNOSIS — M545 Low back pain, unspecified: Secondary | ICD-10-CM | POA: Diagnosis not present

## 2021-11-13 DIAGNOSIS — M25552 Pain in left hip: Secondary | ICD-10-CM | POA: Diagnosis not present

## 2021-11-13 DIAGNOSIS — M2560 Stiffness of unspecified joint, not elsewhere classified: Secondary | ICD-10-CM | POA: Diagnosis not present

## 2021-11-13 DIAGNOSIS — R269 Unspecified abnormalities of gait and mobility: Secondary | ICD-10-CM | POA: Diagnosis not present

## 2021-11-15 DIAGNOSIS — M2560 Stiffness of unspecified joint, not elsewhere classified: Secondary | ICD-10-CM | POA: Diagnosis not present

## 2021-11-15 DIAGNOSIS — R269 Unspecified abnormalities of gait and mobility: Secondary | ICD-10-CM | POA: Diagnosis not present

## 2021-11-15 DIAGNOSIS — M545 Low back pain, unspecified: Secondary | ICD-10-CM | POA: Diagnosis not present

## 2021-11-15 DIAGNOSIS — M25552 Pain in left hip: Secondary | ICD-10-CM | POA: Diagnosis not present

## 2021-11-20 DIAGNOSIS — M2560 Stiffness of unspecified joint, not elsewhere classified: Secondary | ICD-10-CM | POA: Diagnosis not present

## 2021-11-20 DIAGNOSIS — R269 Unspecified abnormalities of gait and mobility: Secondary | ICD-10-CM | POA: Diagnosis not present

## 2021-11-20 DIAGNOSIS — M545 Low back pain, unspecified: Secondary | ICD-10-CM | POA: Diagnosis not present

## 2021-11-20 DIAGNOSIS — M25552 Pain in left hip: Secondary | ICD-10-CM | POA: Diagnosis not present

## 2021-11-22 DIAGNOSIS — M25552 Pain in left hip: Secondary | ICD-10-CM | POA: Diagnosis not present

## 2021-11-22 DIAGNOSIS — R269 Unspecified abnormalities of gait and mobility: Secondary | ICD-10-CM | POA: Diagnosis not present

## 2021-11-22 DIAGNOSIS — M2560 Stiffness of unspecified joint, not elsewhere classified: Secondary | ICD-10-CM | POA: Diagnosis not present

## 2021-11-22 DIAGNOSIS — M545 Low back pain, unspecified: Secondary | ICD-10-CM | POA: Diagnosis not present

## 2021-11-27 DIAGNOSIS — M25552 Pain in left hip: Secondary | ICD-10-CM | POA: Diagnosis not present

## 2021-11-27 DIAGNOSIS — M545 Low back pain, unspecified: Secondary | ICD-10-CM | POA: Diagnosis not present

## 2021-11-27 DIAGNOSIS — M2560 Stiffness of unspecified joint, not elsewhere classified: Secondary | ICD-10-CM | POA: Diagnosis not present

## 2021-11-27 DIAGNOSIS — R269 Unspecified abnormalities of gait and mobility: Secondary | ICD-10-CM | POA: Diagnosis not present

## 2021-11-29 DIAGNOSIS — M2560 Stiffness of unspecified joint, not elsewhere classified: Secondary | ICD-10-CM | POA: Diagnosis not present

## 2021-11-29 DIAGNOSIS — M25552 Pain in left hip: Secondary | ICD-10-CM | POA: Diagnosis not present

## 2021-11-29 DIAGNOSIS — R269 Unspecified abnormalities of gait and mobility: Secondary | ICD-10-CM | POA: Diagnosis not present

## 2021-11-29 DIAGNOSIS — M545 Low back pain, unspecified: Secondary | ICD-10-CM | POA: Diagnosis not present

## 2021-12-04 DIAGNOSIS — M545 Low back pain, unspecified: Secondary | ICD-10-CM | POA: Diagnosis not present

## 2021-12-04 DIAGNOSIS — M25552 Pain in left hip: Secondary | ICD-10-CM | POA: Diagnosis not present

## 2021-12-04 DIAGNOSIS — R269 Unspecified abnormalities of gait and mobility: Secondary | ICD-10-CM | POA: Diagnosis not present

## 2021-12-04 DIAGNOSIS — M2560 Stiffness of unspecified joint, not elsewhere classified: Secondary | ICD-10-CM | POA: Diagnosis not present

## 2021-12-06 DIAGNOSIS — R269 Unspecified abnormalities of gait and mobility: Secondary | ICD-10-CM | POA: Diagnosis not present

## 2021-12-06 DIAGNOSIS — M545 Low back pain, unspecified: Secondary | ICD-10-CM | POA: Diagnosis not present

## 2021-12-06 DIAGNOSIS — M25552 Pain in left hip: Secondary | ICD-10-CM | POA: Diagnosis not present

## 2021-12-06 DIAGNOSIS — M2560 Stiffness of unspecified joint, not elsewhere classified: Secondary | ICD-10-CM | POA: Diagnosis not present

## 2022-01-08 DIAGNOSIS — D485 Neoplasm of uncertain behavior of skin: Secondary | ICD-10-CM | POA: Diagnosis not present

## 2022-01-08 DIAGNOSIS — H02413 Mechanical ptosis of bilateral eyelids: Secondary | ICD-10-CM | POA: Diagnosis not present

## 2022-01-08 DIAGNOSIS — L821 Other seborrheic keratosis: Secondary | ICD-10-CM | POA: Diagnosis not present

## 2022-01-08 DIAGNOSIS — H04562 Stenosis of left lacrimal punctum: Secondary | ICD-10-CM | POA: Diagnosis not present

## 2022-01-08 DIAGNOSIS — H0279 Other degenerative disorders of eyelid and periocular area: Secondary | ICD-10-CM | POA: Diagnosis not present

## 2022-01-21 DIAGNOSIS — H401134 Primary open-angle glaucoma, bilateral, indeterminate stage: Secondary | ICD-10-CM | POA: Diagnosis not present

## 2022-02-08 DIAGNOSIS — D23122 Other benign neoplasm of skin of left lower eyelid, including canthus: Secondary | ICD-10-CM | POA: Diagnosis not present

## 2022-02-08 DIAGNOSIS — D485 Neoplasm of uncertain behavior of skin: Secondary | ICD-10-CM | POA: Diagnosis not present

## 2022-02-08 DIAGNOSIS — H04562 Stenosis of left lacrimal punctum: Secondary | ICD-10-CM | POA: Diagnosis not present

## 2022-02-18 DIAGNOSIS — M5416 Radiculopathy, lumbar region: Secondary | ICD-10-CM | POA: Diagnosis not present

## 2022-02-18 DIAGNOSIS — M47896 Other spondylosis, lumbar region: Secondary | ICD-10-CM | POA: Diagnosis not present

## 2022-02-18 DIAGNOSIS — Z23 Encounter for immunization: Secondary | ICD-10-CM | POA: Diagnosis not present

## 2022-02-18 DIAGNOSIS — M48061 Spinal stenosis, lumbar region without neurogenic claudication: Secondary | ICD-10-CM | POA: Diagnosis not present

## 2022-02-18 DIAGNOSIS — M47812 Spondylosis without myelopathy or radiculopathy, cervical region: Secondary | ICD-10-CM | POA: Diagnosis not present

## 2022-02-21 DIAGNOSIS — H0279 Other degenerative disorders of eyelid and periocular area: Secondary | ICD-10-CM | POA: Diagnosis not present

## 2022-02-21 DIAGNOSIS — H02413 Mechanical ptosis of bilateral eyelids: Secondary | ICD-10-CM | POA: Diagnosis not present

## 2022-02-21 DIAGNOSIS — L821 Other seborrheic keratosis: Secondary | ICD-10-CM | POA: Diagnosis not present

## 2022-02-21 DIAGNOSIS — D210 Benign neoplasm of connective and other soft tissue of head, face and neck: Secondary | ICD-10-CM | POA: Diagnosis not present

## 2022-02-22 ENCOUNTER — Ambulatory Visit: Payer: Medicare Other | Attending: Internal Medicine | Admitting: Internal Medicine

## 2022-02-22 ENCOUNTER — Encounter: Payer: Self-pay | Admitting: Internal Medicine

## 2022-02-22 VITALS — BP 118/62 | HR 73 | Ht 70.0 in | Wt 195.6 lb

## 2022-02-22 DIAGNOSIS — Z86718 Personal history of other venous thrombosis and embolism: Secondary | ICD-10-CM

## 2022-02-22 DIAGNOSIS — E785 Hyperlipidemia, unspecified: Secondary | ICD-10-CM

## 2022-02-22 DIAGNOSIS — I2699 Other pulmonary embolism without acute cor pulmonale: Secondary | ICD-10-CM

## 2022-02-22 DIAGNOSIS — E782 Mixed hyperlipidemia: Secondary | ICD-10-CM | POA: Diagnosis not present

## 2022-02-22 DIAGNOSIS — I493 Ventricular premature depolarization: Secondary | ICD-10-CM

## 2022-02-22 DIAGNOSIS — R55 Syncope and collapse: Secondary | ICD-10-CM | POA: Diagnosis not present

## 2022-02-22 DIAGNOSIS — R5383 Other fatigue: Secondary | ICD-10-CM | POA: Diagnosis not present

## 2022-02-22 NOTE — Progress Notes (Signed)
Cardiology Office Note:    Date:  02/22/2022   ID:  Scott Schneider, DOB 09-02-1940, MRN [id number]  PCP:  Janie Morning, DO  Cardiologist:  Elouise Munroe, MD  Electrophysiologist:  None   Referring MD: Janie Morning, DO   Chief Complaint/Reason for Referral: Pulmonary Emboli  History of Present Illness:    Scott Schneider is a 81 y.o. male with a history of prostate cancer status post radiation therapy in the past and a history of spinal stenosis. He also has a history of a positive COVID test earlier in February. He was admitted 04/24/2020. The patient was walking to his mailbox when he became dizzy followed by shortness of breath followed by near syncope. He called his PCP who suggested he go to the emergency room. In the emergency room his troponins were elevated in the 300-400 range. His EKG had nonspecific ST changes. He underwent diagnostic catheterization 04/25/2020 which showed normal coronaries and normal LV function. Echocardiogram showed an ejection fraction of 50 to 55%. There were PVCs noted. He had a moderately enlarged RV with moderately enlarged pulmonary pressures with a PA pressure of 46. The patient was placed on aspirin and Lipitor and metoprolol and discharged. Seen in follow up in March post hospital and noted right calf swelling. D-dimer elevated, found to have acute bilateral PE. Started on Eliquis. No bleeding. Residual right lower extremity swelling, side of prior DVT. Doing well on eliquis without bleeding, plans to continue for 6 mo.    At his last visit with me on 12/19/21, he reported feeling fatigued and generally weak. We discontinued his Lopressor at that time.  Today, he states that he has been managing well. His only complaint is persistent bilateral leg discomfort x2 years. He notes having some relief of his pain while on Eliquis and resumed after coming off of the Eliquis. However, he had a repeat doppler US last year of his left leg which showed unresolved  clots. He did not have a right leg doppler at that time as his pain was most severe on the left at that time. His PCP resumed his Eliquis in March 2023.  He reports that his leg pain is most bothersome at night but occurs throughout the day. His pain is intermittent. He reports that his legs may also feel heavy at times. He notes accompanying swelling in the mornings which is relieved with use of compressions socks. He denies any claudication. He states that he has some numbness in the bottom of his left foot at times.  He reports chronic skipped beats on rare occasions. He only really notices these when he is checking his blood pressure in the mornings. He denies any racing heartbeats. He reports that he was drinking 6-7 cups of coffee a day but has cut back to 2-3 cups per day.  The patient denies chest pain, chest pressure, dyspnea at rest or with exertion, PND, or orthopnea. Denies cough, fever, chills, nausea, or vomiting. Denies syncope, presyncope, or snoring. Denies dizziness or lightheadedness.     Past Medical History:  Diagnosis Date   CAD (coronary artery disease) 05/10/2020   H/O back injury 1980's   Hyperlipidemia 05/10/2020   Low back pain    NSTEMI (non-ST elevated myocardial infarction) ([place]) 04/24/2020   Other spondylosis, lumbar region    Paresthesia of skin    Prostate cancer ([place])    Radiculopathy    Spinal stenosis of lumbar region    Syncope and collapse 04/26/2020  Past Surgical History:  Procedure Laterality Date   LEFT HEART CATH AND CORONARY ANGIOGRAPHY N/A 04/25/2020   Procedure: LEFT HEART CATH AND CORONARY ANGIOGRAPHY;  Surgeon: Martinique, Peter M, MD;  Location: [facility];  Service: Cardiovascular;  Laterality: N/A;   PROSTATE BIOPSY     TONSILLECTOMY     early 20's    Current Medications: No outpatient medications have been marked as taking for the 02/22/22 encounter (Appointment) with Elouise Munroe, MD.     Allergies:   Patient has no known  allergies.   Social History   Tobacco Use   Smoking status: Never   Smokeless tobacco: Never  Substance Use Topics   Alcohol use: No   Drug use: No     Family History: The patient's family history includes Asthma in his sister; Cancer in his maternal grandmother, mother, and son; Cirrhosis in his father; Diabetes in his sister; Other in his brother.  ROS:   Please see the history of present illness.    + BLE leg pain + BLE swelling + Palpitations All other systems reviewed and are negative.  EKGs/Labs/Other Studies Reviewed:    The following studies were reviewed today:  EKG:  EKG ordered today, 02/22/22.  The ekg ordered today demonstrates Sinus rhythm with 1st degree AV block, PVCs, PACs, and rate of 72bpm.   Imaging studies that I have independently reviewed today:   Echo 11/16/2020: 1.Left ventricular ejection fraction, by estimation, is 55 to 60%. The left ventricle has normal function. The left ventricle has no regional wall motion abnormalities. There is mild concentric left ventricular hypertrophy. Left ventricular diastolic parameters are consistent with Grade I diastolic dysfunction (impaired relaxation). 2. Right ventricular systolic function was not well visualized. The right ventricular size is normal. Tricuspid regurgitation signal is inadequate for assessing PA pressure. 3. The mitral valve is normal in structure. Trivial mitral valve regurgitation. No evidence of mitral stenosis. 4.The aortic valve is tricuspid. Aortic valve regurgitation is not visualized. Mild aortic valve sclerosis is present, with no evidence of aortic valve stenosis. 5.Aortic dilatation noted. There is mild dilatation of the aortic root, measuring 39 mm. There is mild dilatation of the ascending aorta, measuring 40 mm.  CTA 12/05/2020: no recurrent PE, RV normal size.  Left heart cath 04/25/20: The left ventricular systolic function is normal. LV end diastolic pressure is normal. The  left ventricular ejection fraction is 55-65% by visual estimate. 1. No significant coronary artery disease 2. Normal LV function 3. Normal LVEDP  Recent Labs: No results found for requested labs within last 365 days.  Recent Lipid Panel    Component Value Date/Time   CHOL 202 (H) 04/25/2020 0446   TRIG 95 04/25/2020 0446   HDL 44 04/25/2020 0446   CHOLHDL 4.6 04/25/2020 0446   VLDL 19 04/25/2020 0446   LDLCALC 139 (H) 04/25/2020 0446    Physical Exam:    VS:  There were no vitals taken for this visit.    Wt Readings from Last 5 Encounters:  08/23/21 201 lb 12.8 oz (91.5 kg)  12/19/20 213 lb 9.6 oz (96.9 kg)  08/15/20 215 lb (97.5 kg)  05/10/20 206 lb (93.4 kg)  05/10/20 206 lb (93.4 kg)    Constitutional: No acute distress Eyes: sclera non-icteric, normal conjunctiva and lids ENMT: normal dentition, moist mucous membranes Cardiovascular: regular rhythm, normal rate, no murmur. S1 and S2 normal. No jugular venous distention.  Respiratory: clear to auscultation bilaterally GI : normal bowel sounds, soft  and nontender. No distention.   MSK: extremities warm, well perfused. No edema.  NEURO: grossly nonfocal exam, moves all extremities. PSYCH: alert and oriented x 3, normal mood and affect.   ASSESSMENT:    No diagnosis found.  PLAN:    Fatigue, unspecified type - Hold metoprolol, no strong indication in absence of CAD. - Monitor for improvement in symptoms. - I've encouraged the patient to remain active in his community   Bilateral pulmonary embolism (HCC) Syncope and collapse Acute deep vein thrombosis (DVT) of right lower extremity, unspecified vein ([place]) - Has completed 6 mo of anticoagulation with normal CTA and echo and Eliquis was discontinued at that time - His PCP resumed his Eliquis '5mg'$  BID in March 2023 after noting persistent clots on doppler last year - Notes mild BLE pain and swelling intermittently. Denies any claudication symptomatology - Continue  using compression socks - Will continue to monitor  Hyperlipidemia, unspecified hyperlipidemia type - Continue atorvastatin 40 mg daily - Recheck lipids with PCP in March 2024  PVCs - Patient is not bothered by these - Improvement of frequency with caffeine intake reduction - Will not change his regimen at this time  Medication management - as above.  Follow up: 1 year  Total time of encounter: 20 minutes total time of encounter, including 20 minutes spent in face-to-face patient care on the date of this encounter. This time includes coordination of care and counseling regarding above mentioned problem list. Remainder of non-face-to-face time involved reviewing chart documents/testing relevant to the patient encounter and documentation in the medical record. I have independently reviewed documentation from referring provider.   Cherlynn Kaiser, MD, [place]   Shared Decision Making/Informed Consent:       Medication Adjustments/Labs and Tests Ordered: Current medicines are reviewed at length with the patient today.  Concerns regarding medicines are outlined above.   No orders of the defined types were placed in this encounter.    No orders of the defined types were placed in this encounter.    There are no Patient Instructions on file for this visit.    I,Alexis Herring,acting as a Education administrator for Elouise Munroe, MD.,have documented all relevant documentation on the behalf of Elouise Munroe, MD,as directed by  Elouise Munroe, MD while in the presence of Elouise Munroe, MD.  ***

## 2022-02-22 NOTE — Patient Instructions (Signed)
Medication Instructions:  No Changes In Medications at this time.  *If you need a refill on your cardiac medications before your next appointment, please call your pharmacy*  Follow-Up: At [facility], you and your health needs are our priority.  As part of our continuing mission to provide you with exceptional heart care, we have created designated Provider Care Teams.  These Care Teams include your primary Cardiologist (physician) and Advanced Practice Providers (APPs -  Physician Assistants and Nurse Practitioners) who all work together to provide you with the care you need, when you need it.  Your next appointment:   1 year(s)  The format for your next appointment:   In Person  Provider:   Gayatri A Acharya, MD          

## 2022-03-26 DIAGNOSIS — M25552 Pain in left hip: Secondary | ICD-10-CM | POA: Diagnosis not present

## 2022-04-16 DIAGNOSIS — R319 Hematuria, unspecified: Secondary | ICD-10-CM | POA: Diagnosis not present

## 2022-04-16 DIAGNOSIS — M1612 Unilateral primary osteoarthritis, left hip: Secondary | ICD-10-CM | POA: Diagnosis not present

## 2022-04-18 DIAGNOSIS — M25552 Pain in left hip: Secondary | ICD-10-CM | POA: Diagnosis not present

## 2022-04-18 DIAGNOSIS — M48061 Spinal stenosis, lumbar region without neurogenic claudication: Secondary | ICD-10-CM | POA: Diagnosis not present

## 2022-04-19 ENCOUNTER — Other Ambulatory Visit (HOSPITAL_BASED_OUTPATIENT_CLINIC_OR_DEPARTMENT_OTHER): Payer: Self-pay

## 2022-05-14 DIAGNOSIS — M5416 Radiculopathy, lumbar region: Secondary | ICD-10-CM | POA: Diagnosis not present

## 2022-05-28 DIAGNOSIS — Z125 Encounter for screening for malignant neoplasm of prostate: Secondary | ICD-10-CM | POA: Diagnosis not present

## 2022-05-28 DIAGNOSIS — Z79899 Other long term (current) drug therapy: Secondary | ICD-10-CM | POA: Diagnosis not present

## 2022-05-28 DIAGNOSIS — R7309 Other abnormal glucose: Secondary | ICD-10-CM | POA: Diagnosis not present

## 2022-05-28 DIAGNOSIS — E785 Hyperlipidemia, unspecified: Secondary | ICD-10-CM | POA: Diagnosis not present

## 2022-05-28 DIAGNOSIS — R946 Abnormal results of thyroid function studies: Secondary | ICD-10-CM | POA: Diagnosis not present

## 2022-05-28 DIAGNOSIS — Z8546 Personal history of malignant neoplasm of prostate: Secondary | ICD-10-CM | POA: Diagnosis not present

## 2022-06-03 DIAGNOSIS — Z6829 Body mass index (BMI) 29.0-29.9, adult: Secondary | ICD-10-CM | POA: Diagnosis not present

## 2022-06-03 DIAGNOSIS — M48061 Spinal stenosis, lumbar region without neurogenic claudication: Secondary | ICD-10-CM | POA: Diagnosis not present

## 2022-06-03 DIAGNOSIS — M5416 Radiculopathy, lumbar region: Secondary | ICD-10-CM | POA: Diagnosis not present

## 2022-06-04 DIAGNOSIS — M5137 Other intervertebral disc degeneration, lumbosacral region: Secondary | ICD-10-CM | POA: Diagnosis not present

## 2022-06-04 DIAGNOSIS — Z86718 Personal history of other venous thrombosis and embolism: Secondary | ICD-10-CM | POA: Diagnosis not present

## 2022-06-04 DIAGNOSIS — Z0000 Encounter for general adult medical examination without abnormal findings: Secondary | ICD-10-CM | POA: Diagnosis not present

## 2022-06-04 DIAGNOSIS — E785 Hyperlipidemia, unspecified: Secondary | ICD-10-CM | POA: Diagnosis not present

## 2022-06-04 DIAGNOSIS — Z79899 Other long term (current) drug therapy: Secondary | ICD-10-CM | POA: Diagnosis not present

## 2022-06-04 DIAGNOSIS — Z8546 Personal history of malignant neoplasm of prostate: Secondary | ICD-10-CM | POA: Diagnosis not present

## 2022-06-04 DIAGNOSIS — R69 Illness, unspecified: Secondary | ICD-10-CM | POA: Diagnosis not present

## 2022-06-04 DIAGNOSIS — N400 Enlarged prostate without lower urinary tract symptoms: Secondary | ICD-10-CM | POA: Diagnosis not present

## 2022-07-02 DIAGNOSIS — M47816 Spondylosis without myelopathy or radiculopathy, lumbar region: Secondary | ICD-10-CM | POA: Diagnosis not present

## 2022-08-12 DIAGNOSIS — M47816 Spondylosis without myelopathy or radiculopathy, lumbar region: Secondary | ICD-10-CM | POA: Diagnosis not present

## 2022-09-10 DIAGNOSIS — M47816 Spondylosis without myelopathy or radiculopathy, lumbar region: Secondary | ICD-10-CM | POA: Diagnosis not present

## 2022-10-17 DIAGNOSIS — M48061 Spinal stenosis, lumbar region without neurogenic claudication: Secondary | ICD-10-CM | POA: Diagnosis not present

## 2022-10-17 DIAGNOSIS — M791 Myalgia, unspecified site: Secondary | ICD-10-CM | POA: Diagnosis not present

## 2022-10-17 DIAGNOSIS — M47816 Spondylosis without myelopathy or radiculopathy, lumbar region: Secondary | ICD-10-CM | POA: Diagnosis not present

## 2022-11-28 DIAGNOSIS — M48061 Spinal stenosis, lumbar region without neurogenic claudication: Secondary | ICD-10-CM | POA: Diagnosis not present

## 2022-11-28 DIAGNOSIS — Z79899 Other long term (current) drug therapy: Secondary | ICD-10-CM | POA: Diagnosis not present

## 2022-11-28 DIAGNOSIS — M47816 Spondylosis without myelopathy or radiculopathy, lumbar region: Secondary | ICD-10-CM | POA: Diagnosis not present

## 2022-11-28 LAB — LAB REPORT - SCANNED: EGFR: 74

## 2022-11-29 DIAGNOSIS — R350 Frequency of micturition: Secondary | ICD-10-CM | POA: Diagnosis not present

## 2022-11-29 DIAGNOSIS — N400 Enlarged prostate without lower urinary tract symptoms: Secondary | ICD-10-CM | POA: Diagnosis not present

## 2022-12-05 ENCOUNTER — Other Ambulatory Visit: Payer: Self-pay | Admitting: Rehabilitation

## 2022-12-05 DIAGNOSIS — R7309 Other abnormal glucose: Secondary | ICD-10-CM | POA: Diagnosis not present

## 2022-12-05 DIAGNOSIS — Z79899 Other long term (current) drug therapy: Secondary | ICD-10-CM | POA: Diagnosis not present

## 2022-12-05 DIAGNOSIS — M48061 Spinal stenosis, lumbar region without neurogenic claudication: Secondary | ICD-10-CM

## 2022-12-05 DIAGNOSIS — M5137 Other intervertebral disc degeneration, lumbosacral region: Secondary | ICD-10-CM | POA: Diagnosis not present

## 2022-12-05 DIAGNOSIS — Z7901 Long term (current) use of anticoagulants: Secondary | ICD-10-CM | POA: Diagnosis not present

## 2022-12-05 DIAGNOSIS — Z86711 Personal history of pulmonary embolism: Secondary | ICD-10-CM | POA: Diagnosis not present

## 2022-12-05 DIAGNOSIS — Z13220 Encounter for screening for lipoid disorders: Secondary | ICD-10-CM | POA: Diagnosis not present

## 2022-12-05 DIAGNOSIS — Z8546 Personal history of malignant neoplasm of prostate: Secondary | ICD-10-CM | POA: Diagnosis not present

## 2022-12-05 DIAGNOSIS — R946 Abnormal results of thyroid function studies: Secondary | ICD-10-CM | POA: Diagnosis not present

## 2022-12-05 DIAGNOSIS — Z86718 Personal history of other venous thrombosis and embolism: Secondary | ICD-10-CM | POA: Diagnosis not present

## 2022-12-05 DIAGNOSIS — Z23 Encounter for immunization: Secondary | ICD-10-CM | POA: Diagnosis not present

## 2022-12-18 DIAGNOSIS — Z86711 Personal history of pulmonary embolism: Secondary | ICD-10-CM | POA: Diagnosis not present

## 2022-12-18 DIAGNOSIS — Z7901 Long term (current) use of anticoagulants: Secondary | ICD-10-CM | POA: Diagnosis not present

## 2022-12-18 DIAGNOSIS — Z008 Encounter for other general examination: Secondary | ICD-10-CM | POA: Diagnosis not present

## 2022-12-18 DIAGNOSIS — M1990 Unspecified osteoarthritis, unspecified site: Secondary | ICD-10-CM | POA: Diagnosis not present

## 2022-12-18 DIAGNOSIS — Z8249 Family history of ischemic heart disease and other diseases of the circulatory system: Secondary | ICD-10-CM | POA: Diagnosis not present

## 2022-12-18 DIAGNOSIS — Z809 Family history of malignant neoplasm, unspecified: Secondary | ICD-10-CM | POA: Diagnosis not present

## 2022-12-18 DIAGNOSIS — H409 Unspecified glaucoma: Secondary | ICD-10-CM | POA: Diagnosis not present

## 2022-12-18 DIAGNOSIS — Z8546 Personal history of malignant neoplasm of prostate: Secondary | ICD-10-CM | POA: Diagnosis not present

## 2022-12-18 DIAGNOSIS — R030 Elevated blood-pressure reading, without diagnosis of hypertension: Secondary | ICD-10-CM | POA: Diagnosis not present

## 2022-12-18 DIAGNOSIS — Z86718 Personal history of other venous thrombosis and embolism: Secondary | ICD-10-CM | POA: Diagnosis not present

## 2022-12-18 DIAGNOSIS — F329 Major depressive disorder, single episode, unspecified: Secondary | ICD-10-CM | POA: Diagnosis not present

## 2022-12-20 ENCOUNTER — Encounter: Payer: Self-pay | Admitting: Rehabilitation

## 2022-12-23 ENCOUNTER — Encounter: Payer: Self-pay | Admitting: Rehabilitation

## 2022-12-28 ENCOUNTER — Ambulatory Visit
Admission: RE | Admit: 2022-12-28 | Discharge: 2022-12-28 | Disposition: A | Discharge status: Home / Self Care | Payer: Medicare HMO | Source: Ambulatory Visit | Attending: Rehabilitation | Admitting: Rehabilitation

## 2022-12-28 DIAGNOSIS — M5126 Other intervertebral disc displacement, lumbar region: Secondary | ICD-10-CM | POA: Diagnosis not present

## 2022-12-28 DIAGNOSIS — M47816 Spondylosis without myelopathy or radiculopathy, lumbar region: Secondary | ICD-10-CM | POA: Diagnosis not present

## 2022-12-28 DIAGNOSIS — M48061 Spinal stenosis, lumbar region without neurogenic claudication: Secondary | ICD-10-CM

## 2023-01-02 DIAGNOSIS — M47816 Spondylosis without myelopathy or radiculopathy, lumbar region: Secondary | ICD-10-CM | POA: Diagnosis not present

## 2023-01-02 DIAGNOSIS — M48061 Spinal stenosis, lumbar region without neurogenic claudication: Secondary | ICD-10-CM | POA: Diagnosis not present

## 2023-01-15 DIAGNOSIS — M5416 Radiculopathy, lumbar region: Secondary | ICD-10-CM | POA: Diagnosis not present

## 2023-01-28 DIAGNOSIS — M48061 Spinal stenosis, lumbar region without neurogenic claudication: Secondary | ICD-10-CM | POA: Diagnosis not present

## 2023-02-27 ENCOUNTER — Ambulatory Visit: Payer: Medicare HMO | Admitting: Internal Medicine

## 2023-02-27 ENCOUNTER — Ambulatory Visit
Admission: RE | Admit: 2023-02-27 | Discharge: 2023-02-27 | Disposition: A | Discharge status: Home / Self Care | Payer: Medicare HMO | Source: Ambulatory Visit | Attending: Internal Medicine | Admitting: Internal Medicine

## 2023-02-27 VITALS — BP 120/60 | HR 72 | Ht 70.0 in | Wt 199.8 lb

## 2023-02-27 DIAGNOSIS — I493 Ventricular premature depolarization: Secondary | ICD-10-CM | POA: Insufficient documentation

## 2023-02-27 DIAGNOSIS — R55 Syncope and collapse: Secondary | ICD-10-CM | POA: Insufficient documentation

## 2023-02-27 DIAGNOSIS — E785 Hyperlipidemia, unspecified: Secondary | ICD-10-CM

## 2023-02-27 DIAGNOSIS — I2699 Other pulmonary embolism without acute cor pulmonale: Secondary | ICD-10-CM | POA: Insufficient documentation

## 2023-02-27 DIAGNOSIS — Z86718 Personal history of other venous thrombosis and embolism: Secondary | ICD-10-CM

## 2023-02-27 DIAGNOSIS — R600 Localized edema: Secondary | ICD-10-CM

## 2023-02-27 DIAGNOSIS — Z86711 Personal history of pulmonary embolism: Secondary | ICD-10-CM | POA: Insufficient documentation

## 2023-02-27 NOTE — Patient Instructions (Signed)
Medication Instructions:  Your physician recommends that you continue on your current medications as directed. Please refer to the Current Medication list given to you today.  *If you need a refill on your cardiac medications before your next appointment, please call your pharmacy*  Lab Work: None If you have labs (blood work) drawn today and your tests are completely normal, you will receive your results only by: MyChart Message (if you have MyChart) OR A paper copy in the mail If you have any lab test that is abnormal or we need to change your treatment, we will call you to review the results.  Testing/Procedures: Your physician has requested that you have a lower extremity venous duplex. This test is an ultrasound of the veins in the legs. It looks at venous blood flow that carries blood from the heart to the legs. Allow one hour for a Lower Venous exam. Allow thirty minutes for an Upper Venous exam. There are no restrictions or special  instructions.  Your physician has requested that you have an echocardiogram in about six months, just before seeing Dr. Jacques Navy for your 6 month follow up appointment. Echocardiography is a painless test that uses sound waves to create images of your heart. It provides your doctor with information about the size and shape of your heart and how well your heart's chambers and valves are working. This procedure takes approximately one hour. There are no restrictions for this procedure. Please do NOT wear cologne, perfume, aftershave, or lotions (deodorant is allowed). Please arrive 15 minutes prior to your appointment time. This will take place at [address] [place]  Follow-Up: At [facility], you and your health needs are our priority.  As part of our continuing mission to provide you with exceptional heart care, we have created designated Provider Care Teams.  These Care Teams include your primary Cardiologist (physician) and Advanced Practice  Providers (APPs -  Physician Assistants and Nurse Practitioners) who all work together to provide you with the care you need, when you need it.  Your next appointment:   6 month(s)  Provider:   Parke Poisson, MD    Other Instructions Consider wearing Zippered TED/Compression stockings.  Call [phone] for more information/to order. Or go to: [email or web address] (See Handout)

## 2023-03-02 NOTE — Progress Notes (Signed)
Cardiology Office Note:  .   Date:  02/27/2023  ID:  Scott Schneider, DOB [birth year], MRN [id number] PCP: Irena Reichmann, DO  [facility] Providers Cardiologist:  Parke Poisson, MD    History of Present Illness: .   Scott Schneider is a 82 y.o. male.  Discussed the use of AI scribe software for clinical note transcription with the patient, who gave verbal consent to proceed.  History of Present Illness   The patient, with a history of blood clots, presents with bilateral leg swelling and sharp pain in the toes. The symptoms have been ongoing for about two months. The swelling is more pronounced during the day and reduces at night. The patient describes the pain as sharp and stinging, mostly in the toes but also in the legs. The patient denies having diabetes but is borderline prediabetic. The patient had been wearing compression socks until a couple of months ago but stopped due to difficulty in putting them on and off. The patient believes the symptoms were less severe when wearing the compression socks. The patient also has back problems and is currently seeing a doctor for the same. The patient is on Eliquis for a history of blood clots. The patient's cholesterol management is also discussed, with the patient having stopped taking atorvastatin on the advice of another doctor.        ROS: negative except per HPI above.  Studies Reviewed: Marland Kitchen   EKG Interpretation Date/Time:  Thursday February 27 2023 08:34:36 EST Ventricular Rate:  75 PR Interval:  216 QRS Duration:  102 QT Interval:  390 QTC Calculation: 435 R Axis:   -26  Text Interpretation: Sinus rhythm with marked sinus arrhythmia with 1st degree A-V block with occasional Premature ventricular complexes and PACs Confirmed by Weston Brass ([id number]) on 02/27/2023 9:09:05 AM    Results   DIAGNOSTIC EKG: PVCs, otherwise normal (02/27/2023) Echocardiogram: Normal ejection fraction (2022)     Risk Assessment/Calculations:         Physical Exam:   VS:  BP 120/60 (BP Location: Left Arm, Patient Position: Sitting, Cuff Size: Normal)   Pulse 72   Ht 5\' 10"  (1.778 m)   Wt 199 lb 12.8 oz (90.6 kg)   SpO2 98%   BMI 28.67 kg/m    Wt Readings from Last 3 Encounters:  02/27/23 199 lb 12.8 oz (90.6 kg)  02/22/22 195 lb 9.6 oz (88.7 kg)  08/23/21 201 lb 12.8 oz (91.5 kg)     Physical Exam         GEN: Well nourished, well developed in no acute distress NECK: No JVD; No carotid bruits CARDIAC: RRR, no murmurs, rubs, gallops RESPIRATORY:  Clear to auscultation without rales, wheezing or rhonchi  ABDOMEN: Soft, non-tender, non-distended EXTREMITIES:  No edema; No deformity   ASSESSMENT AND PLAN: .    Assessment & Plan PVC's (premature ventricular contractions)  History of DVT (deep vein thrombosis)  History of pulmonary embolus (PE)  Lower extremity edema  Hyperlipidemia, unspecified hyperlipidemia type  Bilateral pulmonary embolism (HCC)  Syncope and collapse   Assessment and Plan    Bilateral Lower Extremity Edema Likely secondary to venous insufficiency, possibly exacerbated by discontinuation of compression socks. No signs of cellulitis or acute DVT. -Order bilateral lower extremity ultrasound to rule out DVT given persistent DVT on prior exams. -Recommend resuming use of compression socks, specifically those with a zipper for ease of application.  Sharp Pain in Toes Unclear etiology, possibly related to edema  or nerve-related due to back issues. Pt denies wounds. -Advise patient to monitor symptoms and report any changes or worsening.  Hyperlipidemia Patient previously on Atorvastatin, but discontinued. Recent cholesterol levels unknown. -Advise patient to send recent cholesterol levels via MyChart for review. -Consider restarting lipid-lowering therapy if cholesterol levels are elevated.  Follow-up -Schedule follow-up appointment in 6 months. -Order echocardiogram prior to next  visit to assess cardiac function.                Parke Poisson, MD

## 2023-03-26 DIAGNOSIS — M48061 Spinal stenosis, lumbar region without neurogenic claudication: Secondary | ICD-10-CM | POA: Diagnosis not present

## 2023-03-26 DIAGNOSIS — M169 Osteoarthritis of hip, unspecified: Secondary | ICD-10-CM | POA: Diagnosis not present

## 2023-04-16 ENCOUNTER — Other Ambulatory Visit (INDEPENDENT_AMBULATORY_CARE_PROVIDER_SITE_OTHER): Payer: Medicare Other

## 2023-04-16 ENCOUNTER — Encounter: Payer: Self-pay | Admitting: Orthopaedic Surgery

## 2023-04-16 ENCOUNTER — Ambulatory Visit: Payer: Medicare Other | Admitting: Orthopaedic Surgery

## 2023-04-16 DIAGNOSIS — M25552 Pain in left hip: Secondary | ICD-10-CM

## 2023-04-16 DIAGNOSIS — M25551 Pain in right hip: Secondary | ICD-10-CM | POA: Diagnosis not present

## 2023-04-16 DIAGNOSIS — M1612 Unilateral primary osteoarthritis, left hip: Secondary | ICD-10-CM

## 2023-04-16 DIAGNOSIS — M1611 Unilateral primary osteoarthritis, right hip: Secondary | ICD-10-CM | POA: Diagnosis not present

## 2023-04-16 NOTE — Progress Notes (Signed)
 Office Visit Note   Patient: Scott Schneider           Date of Birth: [birth year]           MRN: [id number] Visit Date: 04/16/2023              Requested by: Gerome Brunet, DO [address] [place],  [place] [address] PCP: Gerome Brunet, DO   Assessment & Plan: Visit Diagnoses:  1. Bilateral hip pain   2. Primary osteoarthritis of left hip   3. Primary osteoarthritis of right hip     Plan:  Patient has severe end-stage arthritis of bilateral hips could benefit from hip arthroplasty.  However he would need cardiac clearance due to his cardiac history but also due to his chronic anticoagulation and recent ultrasound that showed nonocclusive DVT left popliteal vein and nonocclusive DVT right distal femoral and right popliteal veins.  Does state that he came off of his Eliquis  for 3 months last February with no adverse effects.  Will therefore refer him to cardiology for cardiac clearance for left total hip arthroplasty.  Risk benefits of surgery discussed.  Risk include but are not limited to leg length discrepancy, infection, wound healing issues, nerve vessel injury, DVT PE and blood loss.  Questions were encouraged and answered by Dr. Jacob myself.  Postoperative protocol discussed with patient at length.  Handout and hip model was shown to patient.  We can schedule him for surgery once he has been cleared and if he can come off of his Eliquis  for surgery  Follow-Up Instructions: No follow-ups on file.   Orders:  Orders Placed This Encounter  Procedures   XR HIPS BILAT W OR W/O PELVIS 3-4 VIEWS   No orders of the defined types were placed in this encounter.     Procedures: No procedures performed   Clinical Data: No additional findings.   Subjective: Chief Complaint  Patient presents with   Right Hip - Pain   Left Hip - Pain    HPI [name] is a 83 year old male were seen as a new patient for bilateral hip pain.  Left leg worse than the right.  He was seen  by Dr. Gust for his back.  He has been receiving injections in both hips over the past 3 years which initially gave him improvement but now are having no effect on his hip pain.  States the pain can be severe with movement.  He states getting dressed is a task due to hip pain.  He notes that over the last 2 weeks his left hip has been giving way on him.  He is using a cane to ambulate.  He notes he is having difficulty ambulating.  Pain does awaken him at night.  Takes Tylenol  for the pain.  Past medical history pertinent for history of DVT/PE, coronary artery disease, history of prostate cancer and chronic anticoagulation on Eliquis .  Review of Systems  Constitutional:  Negative for chills and fever.  Respiratory:  Negative for shortness of breath.   Cardiovascular:  Negative for chest pain.     Objective: Vital Signs: There were no vitals taken for this visit.  Physical Exam Constitutional:      Appearance: He is not ill-appearing or diaphoretic.  Pulmonary:     Effort: Pulmonary effort is normal.  Neurological:     Mental Status: He is alert and oriented to person, place, and time.  Psychiatric:        Mood  and Affect: Mood normal.     Ortho Exam Bilateral hips no internal or external rotation.  Discomfort with attempts of internal/external rotation.  Ambulates with antalgic gait in the use of a cane. Specialty Comments:  No specialty comments available.  Imaging: XR HIPS BILAT W OR W/O PELVIS 3-4 VIEWS Result Date: 04/16/2023 AP pelvis lateral view bilateral hips: Right hip end-stage arthritis with virtually no joint space.  Periarticular spurring.  Left hip with cam type impingement and periarticular spurring.  Left hip complete loss of joint space.  Sclerotic changes left femoral head.  No acute fractures of either hip.  No evidence of AVN.    PMFS History: Patient Active Problem List   Diagnosis Date Noted   Acute deep vein thrombosis (DVT) of right lower extremity  (HCC) 05/12/2020   Bilateral pulmonary embolism (HCC) 05/10/2020   Normocytic anemia 05/10/2020   CAD (coronary artery disease) 05/10/2020   Hyperlipidemia 05/10/2020   Syncope and collapse 04/26/2020   Elevated troponin    NSTEMI (non-ST elevated myocardial infarction) (HCC) 04/24/2020   Malignant neoplasm of prostate (HCC) 12/18/2015   Past Medical History:  Diagnosis Date   CAD (coronary artery disease) 05/10/2020   H/O back injury 1980's   Hyperlipidemia 05/10/2020   Low back pain    NSTEMI (non-ST elevated myocardial infarction) (HCC) 04/24/2020   Other spondylosis, lumbar region    Paresthesia of skin    Prostate cancer (HCC)    Radiculopathy    Spinal stenosis of lumbar region    Syncope and collapse 04/26/2020    Family History  Problem Relation Age of Onset   Cancer Mother        breast   Cancer Maternal Grandmother        uterine   Cancer Son        kidney cancer/excised   Cirrhosis Father        liver   Asthma Sister    Other Brother        AIDS   Diabetes Sister     Past Surgical History:  Procedure Laterality Date   LEFT HEART CATH AND CORONARY ANGIOGRAPHY N/A 04/25/2020   Procedure: LEFT HEART CATH AND CORONARY ANGIOGRAPHY;  Surgeon: Jordan, Peter M, MD;  Location: [facility];  Service: Cardiovascular;  Laterality: N/A;   PROSTATE BIOPSY     TONSILLECTOMY     early 20's   Social History   Occupational History    Comment: retired  Tobacco Use   Smoking status: Never   Smokeless tobacco: Never  Substance and Sexual Activity   Alcohol use: No   Drug use: No   Sexual activity: Yes

## 2023-04-30 ENCOUNTER — Encounter: Payer: Self-pay | Admitting: Nurse Practitioner

## 2023-04-30 ENCOUNTER — Ambulatory Visit: Payer: Medicare Other | Attending: Nurse Practitioner | Admitting: Nurse Practitioner

## 2023-04-30 VITALS — BP 110/68 | HR 75 | Ht 70.0 in | Wt 200.4 lb

## 2023-04-30 DIAGNOSIS — I491 Atrial premature depolarization: Secondary | ICD-10-CM | POA: Diagnosis not present

## 2023-04-30 DIAGNOSIS — Z86711 Personal history of pulmonary embolism: Secondary | ICD-10-CM | POA: Diagnosis not present

## 2023-04-30 DIAGNOSIS — Z01810 Encounter for preprocedural cardiovascular examination: Secondary | ICD-10-CM | POA: Diagnosis not present

## 2023-04-30 DIAGNOSIS — Z86718 Personal history of other venous thrombosis and embolism: Secondary | ICD-10-CM

## 2023-04-30 DIAGNOSIS — I493 Ventricular premature depolarization: Secondary | ICD-10-CM | POA: Diagnosis not present

## 2023-04-30 DIAGNOSIS — E785 Hyperlipidemia, unspecified: Secondary | ICD-10-CM

## 2023-04-30 NOTE — Patient Instructions (Signed)
Medication Instructions:  Your physician recommends that you continue on your current medications as directed. Please refer to the Current Medication list given to you today.   *If you need a refill on your cardiac medications before your next appointment, please call your pharmacy*   Lab Work: NONE ordered at this time of appointment   Testing/Procedures: NONE ordered at this time of appointment   Follow-Up: At [facility], you and your health needs are our priority.  As part of our continuing mission to provide you with exceptional heart care, we have created designated Provider Care Teams.  These Care Teams include your primary Cardiologist (physician) and Advanced Practice Providers (APPs -  Physician Assistants and Nurse Practitioners) who all work together to provide you with the care you need, when you need it.  We recommend signing up for the patient portal called "MyChart".  Sign up information is provided on this After Visit Summary.  MyChart is used to connect with patients for Virtual Visits (Telemedicine).  Patients are able to view lab/test results, encounter notes, upcoming appointments, etc.  Non-urgent messages can be sent to your provider as well.   To learn more about what you can do with MyChart, go to [email or web address].    Your next appointment:    Keep follow up   Provider:   Parke Poisson, MD

## 2023-04-30 NOTE — Progress Notes (Signed)
Office Visit    Patient Name: Scott Schneider Date of Encounter: 04/30/2023  Primary Care Provider:  Irena Reichmann, DO Primary Cardiologist:  Parke Poisson, MD  Chief Complaint    83 year old male with a history of near-syncope, hyperlipidemia, PVCs, PACs, PE, DVT, spinal stenosis, and prostate cancer who presents for follow-up related to PVCs and for preoperative cardiac evaluation.   Past Medical History    Past Medical History:  Diagnosis Date   CAD (coronary artery disease) 05/10/2020   H/O back injury 1980's   Hyperlipidemia 05/10/2020   Low back pain    NSTEMI (non-ST elevated myocardial infarction) (HCC) 04/24/2020   Other spondylosis, lumbar region    Paresthesia of skin    Prostate cancer (HCC)    Radiculopathy    Spinal stenosis of lumbar region    Syncope and collapse 04/26/2020   Past Surgical History:  Procedure Laterality Date   LEFT HEART CATH AND CORONARY ANGIOGRAPHY N/A 04/25/2020   Procedure: LEFT HEART CATH AND CORONARY ANGIOGRAPHY;  Surgeon: [place], Peter M, MD;  Location: [facility] INVASIVE CV LAB;  Service: Cardiovascular;  Laterality: N/A;   PROSTATE BIOPSY     TONSILLECTOMY     early 20's    Allergies  No Known Allergies   Labs/Other Studies Reviewed    The following studies were reviewed today:  Cardiac Studies & Procedures   ______________________________________________________________________________________________ CARDIAC CATHETERIZATION  CARDIAC CATHETERIZATION 04/25/2020  Narrative  The left ventricular systolic function is normal.  LV end diastolic pressure is normal.  The left ventricular ejection fraction is 55-65% by visual estimate.  1. No significant coronary artery disease 2. Normal LV function 3. Normal LVEDP  Plan: medical management.  Findings Coronary Findings Diagnostic  Dominance: Right  Left Main Vessel was injected. Vessel is normal in caliber. Vessel is angiographically normal.  Left Anterior  Descending Vessel was injected. Vessel is normal in caliber. The vessel exhibits minimal luminal irregularities.  Left Circumflex Vessel was injected. Vessel is large. The vessel exhibits minimal luminal irregularities.  Right Coronary Artery Vessel was injected. Vessel is normal in caliber. The vessel exhibits minimal luminal irregularities.  Intervention  No interventions have been documented.     ECHOCARDIOGRAM  ECHOCARDIOGRAM COMPLETE 11/16/2020  Narrative ECHOCARDIOGRAM REPORT    Patient Name:   Scott Schneider Date of Exam: 11/16/2020 Medical Rec #:  [id number]     Height:       70.0 in Accession #:    [id number]    Weight:       215.0 lb Date of Birth:  1940-07-20     BSA:          2.152 m Patient Age:    83 years      BP:           110/66 mmHg Patient Gender: M             HR:           78 bpm. Exam Location:  [address]  Procedure: 2D Echo, Cardiac Doppler and Color Doppler  Indications:    I26.99 Pulmonary embolus  History:        Patient has prior history of Echocardiogram examinations, most recent 05/11/2020. Previous Myocardial Infarction and CAD, Pulmonary embolus. DVT., Signs/Symptoms:Syncope; Risk Factors:Dyslipidemia. Prostate cancer. Anemia.  Sonographer:    Jorje Guild [facility], RDCS Referring Phys: [id number] Parke Poisson  IMPRESSIONS   1. Left ventricular ejection fraction, by estimation, is 55 to 60%. The left ventricle has  normal function. The left ventricle has no regional wall motion abnormalities. There is mild concentric left ventricular hypertrophy. Left ventricular diastolic parameters are consistent with Grade I diastolic dysfunction (impaired relaxation). 2. Right ventricular systolic function was not well visualized. The right ventricular size is normal. Tricuspid regurgitation signal is inadequate for assessing PA pressure. 3. The mitral valve is normal in structure. Trivial mitral valve regurgitation. No evidence of mitral stenosis. 4. The  aortic valve is tricuspid. Aortic valve regurgitation is not visualized. Mild aortic valve sclerosis is present, with no evidence of aortic valve stenosis. 5. Aortic dilatation noted. There is mild dilatation of the aortic root, measuring 39 mm. There is mild dilatation of the ascending aorta, measuring 40 mm.  Comparison(s): 05/11/20 EF 50-55%. PA pressure .  FINDINGS Left Ventricle: Left ventricular ejection fraction, by estimation, is 55 to 60%. The left ventricle has normal function. The left ventricle has no regional wall motion abnormalities. The left ventricular internal cavity size was normal in size. There is mild concentric left ventricular hypertrophy. Left ventricular diastolic parameters are consistent with Grade I diastolic dysfunction (impaired relaxation). Normal left ventricular filling pressure.  Right Ventricle: The right ventricular size is normal. No increase in right ventricular wall thickness. Right ventricular systolic function was not well visualized. Tricuspid regurgitation signal is inadequate for assessing PA pressure.  Left Atrium: Left atrial size was normal in size.  Right Atrium: Right atrial size was normal in size.  Pericardium: There is no evidence of pericardial effusion.  Mitral Valve: The mitral valve is normal in structure. Trivial mitral valve regurgitation. No evidence of mitral valve stenosis.  Tricuspid Valve: The tricuspid valve is normal in structure. Tricuspid valve regurgitation is mild . No evidence of tricuspid stenosis.  Aortic Valve: The aortic valve is tricuspid. Aortic valve regurgitation is not visualized. Mild aortic valve sclerosis is present, with no evidence of aortic valve stenosis.  Pulmonic Valve: The pulmonic valve was normal in structure. Pulmonic valve regurgitation is mild. No evidence of pulmonic stenosis.  Aorta: Aortic dilatation noted. There is mild dilatation of the aortic root, measuring 39 mm. There is mild  dilatation of the ascending aorta, measuring 40 mm.  Venous: The inferior vena cava was not well visualized.  IAS/Shunts: No atrial level shunt detected by color flow Doppler.   LEFT VENTRICLE PLAX 2D LVIDd:         4.70 cm  Diastology LVIDs:         3.30 cm  LV e' medial:    7.20 cm/s LV PW:         1.20 cm  LV E/e' medial:  8.8 LV IVS:        1.20 cm  LV e' lateral:   10.20 cm/s LVOT diam:     2.30 cm  LV E/e' lateral: 6.2 LV SV:         95 LV SV Index:   44 LVOT Area:     4.15 cm   RIGHT VENTRICLE RV Basal diam:  3.70 cm RV S prime:     10.90 cm/s TAPSE (M-mode): 2.3 cm RVSP:           40.9 mmHg  LEFT ATRIUM             Index       RIGHT ATRIUM           Index LA diam:        4.20 cm 1.95 cm/m  RA Pressure: 8.00 mmHg LA Vol (  A2C):   32.8 ml 15.24 ml/m RA Area:     17.50 cm LA Vol (A4C):   41.0 ml 19.05 ml/m RA Volume:   47.40 ml  22.02 ml/m LA Biplane Vol: 37.3 ml 17.33 ml/m AORTIC VALVE LVOT Vmax:   106.40 cm/s LVOT Vmean:  73.100 cm/s LVOT VTI:    0.228 m  AORTA Ao Root diam: 3.90 cm Ao Asc diam:  4.00 cm  MITRAL VALVE               TRICUSPID VALVE TR Peak grad:   32.9 mmHg MV Decel Time: 225 msec    TR Vmax:        287.00 cm/s MV E velocity: 63.40 cm/s  Estimated RAP:  8.00 mmHg MV A velocity: 79.10 cm/s  RVSP:           40.9 mmHg MV E/A ratio:  0.80 SHUNTS Systemic VTI:  0.23 m Systemic Diam: 2.30 cm  Armanda Magic MD Electronically signed by Armanda Magic MD Signature Date/Time: 11/16/2020/11:05:35 AM    Final          ______________________________________________________________________________________________     Recent Labs: No results found for requested labs within last 365 days.  Recent Lipid Panel    Component Value Date/Time   CHOL 202 (H) 04/25/2020 0446   TRIG 95 04/25/2020 0446   HDL 44 04/25/2020 0446   CHOLHDL 4.6 04/25/2020 0446   VLDL 19 04/25/2020 0446   LDLCALC 139 (H) 04/25/2020 0446    History of Present  Illness    83 year old male with the above past medical history including near-syncope, hyperlipidemia, PVCs, PACs, PE, DVT, spinal stenosis, and prostate cancer.    He was hospitalized in February 2022 following a near syncopal episode, acute shortness of breath.  Troponin was elevated in the 300-400 range, EKG had nonspecific ST changes. He underwent cardiac catheterization which revealed normal coronaries and normal LV function.  Echocardiogram at the time showed EF 50 to 55%.  He was noted to have PVCs on telemetry.  He had a moderately enlarged RV with moderate and large pulmonary pressures with a PA pressure of 46. He was seen in follow-up posthospitalization and noted right calf swelling.  D-dimer was elevated, he was found to have acute bilateral PEs, DVT.  He was started on Eliquis, therapy was recommended for a total of 6 months. Repeat echocardiogram in September 2022 showed EF 55 to 60%, normal LV function, no RWMA, G1 DD, right ventricular systolic function was not well visualized, RV size was normal.  Metoprolol was discontinued in the setting of fatigue.  He was last seen in the office on 02/27/2023 and was stable from a cardiac standpoint.  He did note bilateral lower extremity edema.  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  He was advised to continue Eliquis.   He presents today for follow-up and for preoperative cardiac evaluation for upcoming left hip surgery with Dr. Doneen Poisson of [facility]. Since his last visit he has done well from a cardiac standpoint.  He has stable nonpitting bilateral lower extremity edema, improved with compression, elevation. He notes that has been difficult for him to don his compression stockings in the setting of left hip pain (it is painful to bend forward).  He is pending left hip surgery.  His activity has been somewhat limited  in the setting.  He denies any symptoms concerning  for angina, denies palpitations, dizziness, presyncope or syncope.  BP is stable.  Overall, from a cardiac standpoint, he reports feeling well.  Home Medications    Current Outpatient Medications  Medication Sig Dispense Refill   acetaminophen (TYLENOL) 325 MG tablet Take 650 mg by mouth every 6 (six) hours as needed for mild pain.     ELIQUIS 5 MG TABS tablet Take 1 tablet by mouth 2 (two) times daily.     latanoprost (XALATAN) 0.005 % ophthalmic solution 1 drop at bedtime.     VITAMIN D PO Take 1 tablet by mouth daily.     No current facility-administered medications for this visit.     Review of Systems    He denies chest pain, palpitations, dyspnea, pnd, orthopnea, n, v, dizziness, syncope, weight gain, or early satiety. All other systems reviewed and are otherwise negative except as noted above.   Physical Exam    VS:  BP 110/68 (BP Location: Left Arm, Patient Position: Sitting)   Pulse 75   Ht 5\' 10"  (1.778 m)   Wt 200 lb 6.4 oz (90.9 kg)   SpO2 98%   BMI 28.75 kg/m  GEN: Well nourished, well developed, in no acute distress. HEENT: normal. Neck: Supple, no JVD, carotid bruits, or masses. Cardiac: RRR, no murmurs, rubs, or gallops. No clubbing, cyanosis, nonpitting bilateral lower extremity edema.  Radials/DP/PT 2+ and equal bilaterally.  Respiratory:  Respirations regular and unlabored, clear to auscultation bilaterally. GI: Soft, nontender, nondistended, BS + x 4. MS: no deformity or atrophy. Skin: warm and dry, no rash. Neuro:  Strength and sensation are intact. Psych: Normal affect.  Accessory Clinical Findings    ECG personally reviewed by me today - EKG Interpretation Date/Time:  Wednesday April 30 2023 09:51:35 EST Ventricular Rate:  75 PR Interval:  220 QRS Duration:  104 QT Interval:  394 QTC Calculation: 439 R Axis:   8  Text Interpretation: Sinus rhythm with 1st degree A-V block with Premature  supraventricular complexes When compared with ECG of 27-Feb-2023 08:34, Premature ventricular complexes are no longer Present Premature supraventricular complexes are now Present QRS axis Shifted right Confirmed by Bernadene Person ([id number]) on 04/30/2023 10:05:11 AM  - no acute changes.   Lab Results  Component Value Date   WBC 5.5 05/12/2020   HGB 12.3 (L) 05/12/2020   HCT 37.5 (L) 05/12/2020   MCV 87.0 05/12/2020   PLT 256 05/12/2020   Lab Results  Component Value Date   CREATININE 1.10 12/05/2020   BUN 13 05/12/2020   NA 137 05/12/2020   K 4.0 05/12/2020   CL 105 05/12/2020   CO2 22 05/12/2020   Lab Results  Component Value Date   ALT 12 12/08/2019   AST 17 12/08/2019   ALKPHOS 58 12/08/2019   BILITOT 0.3 12/08/2019   Lab Results  Component Value Date   CHOL 202 (H) 04/25/2020   HDL 44 04/25/2020   LDLCALC 139 (H) 04/25/2020   TRIG 95 04/25/2020   CHOLHDL 4.6 04/25/2020    Lab Results  Component Value Date   HGBA1C 6.0 (H) 12/08/2019    Assessment & Plan    1. H/o PE/DVT/near syncope: Prior episode of near syncope in 2022 in the setting of PE/DVT.  Echocardiogram at the time showed EF 50 to 55%.   Repeat echocardiogram in September 2022 showed EF 55 to 60%, normal LV function, no RWMA, G1 DD, right ventricular systolic function was not well visualized, RV size was normal. 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 He has stable nonpitting bilateral lower extremity edema, improved with compression, elevation.  He denies any chest pain, palpitations, dyspnea, presyncope or syncope.  Continue Eliquis.   2. PVCs/PACs: EKG today shows sinus rhythm with PACs.  He is asymptomatic.  Metoprolol was previously discontinued in the setting of fatigue. Continue to monitor.    3. Hyperlipidemia: No recent LDL on file. Previously on Lipitor.  However, this was  discontinued per shared decision making with his PCP.    4. Preoperative cardiac evaluation: According to the Revised Cardiac Risk Index (RCRI), his Perioperative Risk of Major Cardiac Event is (%): 0.4. His Functional Capacity in METs is: 5.07 according to the Duke Activity Status Index (DASI). Therefore, based on ACC/AHA guidelines, patient would be at acceptable risk for the planned procedure without further cardiovascular testing.  Pt takes Eliquis for history of PE/DVT.  This is managed per PCP.  For holding Eliquis prior to surgery should come from managing provider (PCP).  I will route this recommendation to the requesting party via Epic fax function.  5. Disposition: Follow-up as scheduled with Dr. Jacques Navy in 08/2023 (pt wishes to keep this appointment).       Joylene Grapes, NP 04/30/2023, 10:33 AM

## 2023-05-15 ENCOUNTER — Encounter: Payer: Self-pay | Admitting: Orthopaedic Surgery

## 2023-05-26 DIAGNOSIS — H401134 Primary open-angle glaucoma, bilateral, indeterminate stage: Secondary | ICD-10-CM | POA: Diagnosis not present

## 2023-05-29 DIAGNOSIS — R7309 Other abnormal glucose: Secondary | ICD-10-CM | POA: Diagnosis not present

## 2023-05-29 DIAGNOSIS — Z79899 Other long term (current) drug therapy: Secondary | ICD-10-CM | POA: Diagnosis not present

## 2023-05-29 DIAGNOSIS — R946 Abnormal results of thyroid function studies: Secondary | ICD-10-CM | POA: Diagnosis not present

## 2023-05-29 DIAGNOSIS — Z7901 Long term (current) use of anticoagulants: Secondary | ICD-10-CM | POA: Diagnosis not present

## 2023-06-05 DIAGNOSIS — Z0000 Encounter for general adult medical examination without abnormal findings: Secondary | ICD-10-CM | POA: Diagnosis not present

## 2023-06-05 DIAGNOSIS — N390 Urinary tract infection, site not specified: Secondary | ICD-10-CM | POA: Diagnosis not present

## 2023-06-05 DIAGNOSIS — M1612 Unilateral primary osteoarthritis, left hip: Secondary | ICD-10-CM | POA: Diagnosis not present

## 2023-06-05 DIAGNOSIS — Z79899 Other long term (current) drug therapy: Secondary | ICD-10-CM | POA: Diagnosis not present

## 2023-06-18 NOTE — Progress Notes (Signed)
 COVID Vaccine Completed:  Date of COVID positive in last 90 days:  PCP - Irena Reichmann, DO Cardiologist - Weston Brass, MD LOV 02/27/23  Cardiac clearance by Bernadene Person, NP 04/30/23 in Epic   Chest x-ray -  EKG - 04/30/23 Epic Stress Test -  ECHO - 11/16/20 Epic Cardiac Cath - 04/25/20 Epic Pacemaker/ICD device last checked: Spinal Cord Stimulator:  Bowel Prep -   Sleep Study -  CPAP -   Fasting Blood Sugar -  Checks Blood Sugar _____ times a day  Last dose of GLP1 agonist-  N/A GLP1 instructions:  Hold 7 days before surgery    Last dose of SGLT-2 inhibitors-  N/A SGLT-2 instructions:  Hold 3 days before surgery    Blood Thinner Instructions: Eliquis Aspirin Instructions: Last Dose:  Activity level:  Can go up a flight of stairs and perform activities of daily living without stopping and without symptoms of chest pain or shortness of breath.  Able to exercise without symptoms  Unable to go up a flight of stairs without symptoms of     Anesthesia review: NSTEMI, CAD, PE, DVT, 1st degree AV block  Patient denies shortness of breath, fever, cough and chest pain at PAT appointment  Patient verbalized understanding of instructions that were given to them at the PAT appointment. Patient was also instructed that they will need to review over the PAT instructions again at home before surgery.

## 2023-06-19 ENCOUNTER — Other Ambulatory Visit: Payer: Self-pay

## 2023-06-19 ENCOUNTER — Encounter (HOSPITAL_COMMUNITY)
Admission: RE | Admit: 2023-06-19 | Discharge: 2023-06-19 | Disposition: A | Discharge status: Home / Self Care | Source: Ambulatory Visit | Attending: Orthopaedic Surgery | Admitting: Orthopaedic Surgery

## 2023-06-19 ENCOUNTER — Encounter (HOSPITAL_COMMUNITY): Payer: Self-pay

## 2023-06-19 VITALS — BP 117/77 | HR 78 | Temp 98.1°F | Resp 16 | Ht 70.0 in | Wt 194.0 lb

## 2023-06-19 DIAGNOSIS — M1612 Unilateral primary osteoarthritis, left hip: Secondary | ICD-10-CM | POA: Diagnosis not present

## 2023-06-19 DIAGNOSIS — Z86718 Personal history of other venous thrombosis and embolism: Secondary | ICD-10-CM | POA: Diagnosis not present

## 2023-06-19 DIAGNOSIS — Z86711 Personal history of pulmonary embolism: Secondary | ICD-10-CM | POA: Insufficient documentation

## 2023-06-19 DIAGNOSIS — Z8673 Personal history of transient ischemic attack (TIA), and cerebral infarction without residual deficits: Secondary | ICD-10-CM | POA: Insufficient documentation

## 2023-06-19 DIAGNOSIS — E785 Hyperlipidemia, unspecified: Secondary | ICD-10-CM | POA: Insufficient documentation

## 2023-06-19 DIAGNOSIS — I2510 Atherosclerotic heart disease of native coronary artery without angina pectoris: Secondary | ICD-10-CM | POA: Insufficient documentation

## 2023-06-19 DIAGNOSIS — Z01812 Encounter for preprocedural laboratory examination: Secondary | ICD-10-CM | POA: Diagnosis present

## 2023-06-19 DIAGNOSIS — Z01818 Encounter for other preprocedural examination: Secondary | ICD-10-CM

## 2023-06-19 HISTORY — DX: Depression, unspecified: F32.A

## 2023-06-19 HISTORY — DX: Unspecified osteoarthritis, unspecified site: M19.90

## 2023-06-19 HISTORY — DX: Transient cerebral ischemic attack, unspecified: G45.9

## 2023-06-19 LAB — BASIC METABOLIC PANEL WITH GFR
Anion gap: 5 (ref 5–15)
BUN: 11 mg/dL (ref 8–23)
CO2: 25 mmol/L (ref 22–32)
Calcium: 9.3 mg/dL (ref 8.9–10.3)
Chloride: 105 mmol/L (ref 98–111)
Creatinine, Ser: 0.97 mg/dL (ref 0.61–1.24)
GFR, Estimated: >60 mL/min
Glucose, Bld: 93 mg/dL (ref 70–99)
Potassium: 4.3 mmol/L (ref 3.5–5.1)
Sodium: 135 mmol/L (ref 135–145)

## 2023-06-19 LAB — CBC
HCT: 41.3 % (ref 39.0–52.0)
Hemoglobin: 13.3 g/dL (ref 13.0–17.0)
MCH: 29.2 pg (ref 26.0–34.0)
MCHC: 32.2 g/dL (ref 30.0–36.0)
MCV: 90.6 fL (ref 80.0–100.0)
Platelets: 306 10*3/uL (ref 150–400)
RBC: 4.56 MIL/uL (ref 4.22–5.81)
RDW: 13.2 % (ref 11.5–15.5)
WBC: 5.1 10*3/uL (ref 4.0–10.5)
nRBC: 0 % (ref 0.0–0.2)

## 2023-06-19 LAB — SURGICAL PCR SCREEN
MRSA, PCR: NEGATIVE
Staphylococcus aureus: NEGATIVE

## 2023-06-20 NOTE — Progress Notes (Signed)
 Anesthesia Chart Review:  83 year old male follows with cardiology for history of CAD, TIA, PACs, DVT/PE, HLD, near-syncope.  Cath 04/25/2020 showed normal coronaries, normal LV function.  Echo 11/16/2020 showed EF 55 to 60%, grade 1 DD, no significant valvular abnormalities.  Seen by Ines Bloomer, NP on 04/30/2023 for preop eval.  Per note, " According to the Revised Cardiac Risk Index (RCRI), his Perioperative Risk of Major Cardiac Event is (%): 0.4. His Functional Capacity in METs is: 5.07 according to the Duke Activity Status Index (DASI). Therefore, based on ACC/AHA guidelines, patient would be at acceptable risk for the planned procedure without further cardiovascular testing.  Pt takes Eliquis for history of PE/DVT.  This is managed per PCP.  For holding Eliquis prior to surgery should come from managing provider (PCP)."  Patient reports last dose of Eliquis 06/23/2023.  Preop labs reviewed, WNL.  EKG 04/30/2023: Sinus rhythm with 1st degree A-V block with Premature supraventricular complexes.  Rate 75.  TTE 11/16/2020:  1. Left ventricular ejection fraction, by estimation, is 55 to 60%. The  left ventricle has normal function. The left ventricle has no regional  wall motion abnormalities. There is mild concentric left ventricular  hypertrophy. Left ventricular diastolic  parameters are consistent with Grade I diastolic dysfunction (impaired  relaxation).   2. Right ventricular systolic function was not well visualized. The right  ventricular size is normal. Tricuspid regurgitation signal is inadequate  for assessing PA pressure.   3. The mitral valve is normal in structure. Trivial mitral valve  regurgitation. No evidence of mitral stenosis.   4. The aortic valve is tricuspid. Aortic valve regurgitation is not  visualized. Mild aortic valve sclerosis is present, with no evidence of  aortic valve stenosis.   5. Aortic dilatation noted. There is mild dilatation of the aortic root,  measuring  39 mm. There is mild dilatation of the ascending aorta,  measuring 40 mm.   Cath 04/25/2020:  The left ventricular systolic function is normal. LV end diastolic pressure is normal. The left ventricular ejection fraction is 55-65% by visual estimate.   1. No significant coronary artery disease 2. Normal LV function 3. Normal LVEDP   Plan: medical management.     Scott Schneider, Scott Schneider [facility]/Anesthesiology Phone [phone] 06/20/2023 11:16 AM

## 2023-06-20 NOTE — Anesthesia Preprocedure Evaluation (Addendum)
 Anesthesia Evaluation  Patient identified by MRN, date of birth, ID band Patient awake    Reviewed: Allergy & Precautions, NPO status , Patient's Chart, lab work & pertinent test results  History of Anesthesia Com

## 2023-06-24 DIAGNOSIS — M1612 Unilateral primary osteoarthritis, left hip: Secondary | ICD-10-CM | POA: Diagnosis not present

## 2023-06-26 ENCOUNTER — Telehealth: Payer: Self-pay | Admitting: *Deleted

## 2023-06-26 DIAGNOSIS — M1612 Unilateral primary osteoarthritis, left hip: Secondary | ICD-10-CM | POA: Insufficient documentation

## 2023-06-26 NOTE — H&P (Signed)
 TOTAL HIP ADMISSION H&P  Patient is admitted for left total hip arthroplasty.  Subjective:  Chief Complaint: left hip pain  HPI: Scott Schneider, 83 y.o. male, has a history of pain and functional disability in the left hip(s) due to arthritis and patient has failed non-surgical conservative treatments for greater than 12 weeks to include corticosteriod injections, use of assistive devices, and activity modification.  Onset of symptoms was gradual starting several years ago with gradually worsening course since that time.The patient noted no past surgery on the left hip(s).  Patient currently rates pain in the left hip at 10 out of 10 with activity. Patient has night pain, worsening of pain with activity and weight bearing, trendelenberg gait, pain that interfers with activities of daily living, and pain with passive range of motion. Patient has evidence of subchondral sclerosis, periarticular osteophytes, joint subluxation, and joint space narrowing by imaging studies. This condition presents safety issues increasing the risk of falls. There is no current active infection.  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   Acute deep vein thrombosis (DVT) of right lower extremity (HCC) 05/12/2020   Bilateral pulmonary embolism (HCC) 05/10/2020   Normocytic anemia 05/10/2020   CAD (coronary artery disease) 05/10/2020   Hyperlipidemia 05/10/2020   Syncope and collapse 04/26/2020   Elevated troponin    NSTEMI (non-ST elevated myocardial infarction) (HCC) 04/24/2020   Malignant neoplasm of prostate (HCC) 12/18/2015   Past Medical History:  Diagnosis Date   Arthritis    CAD (coronary artery disease) 05/10/2020   Depression    H/O back injury 1980's   Hyperlipidemia 05/10/2020   Low back pain    NSTEMI (non-ST elevated myocardial infarction) (HCC) 04/24/2020   Other spondylosis, lumbar region    Paresthesia of skin    Prostate  cancer (HCC)    Radiculopathy    Spinal stenosis of lumbar region    Syncope and collapse 04/26/2020   TIA (transient ischemic attack)    seen on imaging per pt    Past Surgical History:  Procedure Laterality Date   LEFT HEART CATH AND CORONARY ANGIOGRAPHY N/A 04/25/2020   Procedure: LEFT HEART CATH AND CORONARY ANGIOGRAPHY;  Surgeon: [place], Peter M, MD;  Location: [facility] INVASIVE CV LAB;  Service: Cardiovascular;  Laterality: N/A;   PROSTATE BIOPSY     TONSILLECTOMY     early 20's    No current facility-administered medications for this encounter.   Current Outpatient Medications  Medication Sig Dispense Refill Last Dose/Taking   acetaminophen (TYLENOL) 325 MG tablet Take 650 mg by mouth every 6 (six) hours as needed for mild pain.   Taking As Needed   ELIQUIS 5 MG TABS tablet Take 5 mg by mouth 2 (two) times daily.   Taking   latanoprost (XALATAN) 0.005 % ophthalmic solution Place 1 drop into both eyes at bedtime.   Taking   tamsulosin (FLOMAX) 0.4 MG CAPS capsule Take 0.4 mg by mouth at bedtime.   Taking   VITAMIN D PO Take 1 tablet by mouth daily.   Taking   amoxicillin (AMOXIL) 500 MG tablet Take 500 mg by mouth 2 (two) times daily. (Patient not taking: Reported on 06/19/2023)   Not Taking   No Known Allergies  Social History   Tobacco Use   Smoking status: Never   Smokeless tobacco: Never  Substance Use Topics   Alcohol use: No    Family History  Problem Relation Age of  Onset   Cancer Mother        breast   Cancer Maternal Grandmother        uterine   Cancer Son        kidney cancer/excised   Cirrhosis Father        liver   Asthma Sister    Other Brother        AIDS   Diabetes Sister      Review of Systems  Objective:  Physical Exam Vitals reviewed.  Constitutional:      Appearance: Normal appearance. He is normal weight.  HENT:     Head: Normocephalic and atraumatic.  Eyes:     Extraocular Movements: Extraocular movements intact.     Pupils: Pupils are  equal, round, and reactive to light.  Cardiovascular:     Rate and Rhythm: Normal rate.  Pulmonary:     Effort: Pulmonary effort is normal.  Abdominal:     Palpations: Abdomen is soft.  Musculoskeletal:     Cervical back: Normal range of motion and neck supple.     Left hip: Tenderness present. Decreased range of motion. Decreased strength.  Neurological:     Mental Status: He is alert and oriented to person, place, and time.  Psychiatric:        Behavior: Behavior normal.     Vital signs in last 24 hours:    Labs:   Estimated body mass index is 27.84 kg/m as calculated from the following:   Height as of 06/19/23: 5\' 10"  (1.778 m).   Weight as of 06/19/23: 88 kg.   Imaging Review Plain radiographs demonstrate severe degenerative joint disease of the left hip(s). The bone quality appears to be good for age and reported activity level.      Assessment/Plan:  End stage arthritis, left hip(s)  The patient history, physical examination, clinical judgement of the provider and imaging studies are consistent with end stage degenerative joint disease of the left hip(s) and total hip arthroplasty is deemed medically necessary. The treatment options including medical management, injection therapy, arthroscopy and arthroplasty were discussed at length. The risks and benefits of total hip arthroplasty were presented and reviewed. The risks due to aseptic loosening, infection, stiffness, dislocation/subluxation,  thromboembolic complications and other imponderables were discussed.  The patient acknowledged the explanation, agreed to proceed with the plan and consent was signed. Patient is being admitted for inpatient treatment for surgery, pain control, PT, OT, prophylactic antibiotics, VTE prophylaxis, progressive ambulation and ADL's and discharge planning.The patient is planning to be discharged home with home health services

## 2023-06-26 NOTE — Care Plan (Signed)
 [facility] RNCM met with patient in office today to discuss his upcoming Left total hip arthroplasty with Dr. Lucienne Ryder at [facility] on 06/27/23. He is agreeable to case management. He lives alone, but has a son that lives nearby, but works 2nd-3rd shifts. He is contacting friends as well to see if they can provide meals and assistance. Doesn't want to go SNF for STR after discharge, but may ending up depending on how he is doing with therapy after surgery. He has a cane and a RW at home. Anticipate HHPT will be needed or maybe even OT and an aide for assistance after a short hospital stay. Referral made to [facility] after choice provided. Reviewed all post op instructions with patient and will continue to follow for needs.

## 2023-06-26 NOTE — Telephone Encounter (Signed)
 [facility] pre-op call completed.

## 2023-06-27 ENCOUNTER — Other Ambulatory Visit: Payer: Self-pay

## 2023-06-27 ENCOUNTER — Observation Stay (HOSPITAL_COMMUNITY)

## 2023-06-27 ENCOUNTER — Inpatient Hospital Stay (HOSPITAL_COMMUNITY)
Admission: AD | Admit: 2023-06-27 | Discharge: 2023-06-30 | DRG: 470 | Disposition: A | Discharge status: Home Health | Attending: Orthopaedic Surgery | Admitting: Orthopaedic Surgery

## 2023-06-27 ENCOUNTER — Ambulatory Visit (HOSPITAL_COMMUNITY)

## 2023-06-27 ENCOUNTER — Encounter (HOSPITAL_COMMUNITY): Payer: Self-pay | Admitting: Orthopaedic Surgery

## 2023-06-27 ENCOUNTER — Encounter (HOSPITAL_COMMUNITY)
Admission: AD | Disposition: A | Discharge status: Home Health | Payer: Self-pay | Source: Home / Self Care | Attending: Orthopaedic Surgery

## 2023-06-27 ENCOUNTER — Ambulatory Visit (HOSPITAL_COMMUNITY): Admitting: Anesthesiology

## 2023-06-27 ENCOUNTER — Ambulatory Visit (HOSPITAL_COMMUNITY): Payer: Self-pay | Admitting: Physician Assistant

## 2023-06-27 DIAGNOSIS — M1612 Unilateral primary osteoarthritis, left hip: Principal | ICD-10-CM | POA: Diagnosis present

## 2023-06-27 DIAGNOSIS — Z9181 History of falling: Secondary | ICD-10-CM | POA: Diagnosis not present

## 2023-06-27 DIAGNOSIS — Z7901 Long term (current) use of anticoagulants: Secondary | ICD-10-CM

## 2023-06-27 DIAGNOSIS — Z79899 Other long term (current) drug therapy: Secondary | ICD-10-CM | POA: Diagnosis not present

## 2023-06-27 DIAGNOSIS — Z8673 Personal history of transient ischemic attack (TIA), and cerebral infarction without residual deficits: Secondary | ICD-10-CM

## 2023-06-27 DIAGNOSIS — Z8349 Family history of other endocrine, nutritional and metabolic diseases: Secondary | ICD-10-CM

## 2023-06-27 DIAGNOSIS — Z471 Aftercare following joint replacement surgery: Secondary | ICD-10-CM | POA: Diagnosis not present

## 2023-06-27 DIAGNOSIS — E785 Hyperlipidemia, unspecified: Secondary | ICD-10-CM | POA: Diagnosis present

## 2023-06-27 DIAGNOSIS — Z86718 Personal history of other venous thrombosis and embolism: Secondary | ICD-10-CM

## 2023-06-27 DIAGNOSIS — I252 Old myocardial infarction: Secondary | ICD-10-CM

## 2023-06-27 DIAGNOSIS — F32A Depression, unspecified: Secondary | ICD-10-CM

## 2023-06-27 DIAGNOSIS — R54 Age-related physical debility: Secondary | ICD-10-CM | POA: Diagnosis not present

## 2023-06-27 DIAGNOSIS — M1611 Unilateral primary osteoarthritis, right hip: Secondary | ICD-10-CM | POA: Diagnosis not present

## 2023-06-27 DIAGNOSIS — Z86711 Personal history of pulmonary embolism: Secondary | ICD-10-CM

## 2023-06-27 DIAGNOSIS — Z803 Family history of malignant neoplasm of breast: Secondary | ICD-10-CM | POA: Diagnosis not present

## 2023-06-27 DIAGNOSIS — Z8051 Family history of malignant neoplasm of kidney: Secondary | ICD-10-CM

## 2023-06-27 DIAGNOSIS — G459 Transient cerebral ischemic attack, unspecified: Secondary | ICD-10-CM

## 2023-06-27 DIAGNOSIS — Z833 Family history of diabetes mellitus: Secondary | ICD-10-CM

## 2023-06-27 DIAGNOSIS — Z8546 Personal history of malignant neoplasm of prostate: Secondary | ICD-10-CM

## 2023-06-27 DIAGNOSIS — Z825 Family history of asthma and other chronic lower respiratory diseases: Secondary | ICD-10-CM

## 2023-06-27 DIAGNOSIS — Z8049 Family history of malignant neoplasm of other genital organs: Secondary | ICD-10-CM

## 2023-06-27 DIAGNOSIS — Z96642 Presence of left artificial hip joint: Secondary | ICD-10-CM | POA: Diagnosis not present

## 2023-06-27 DIAGNOSIS — I2510 Atherosclerotic heart disease of native coronary artery without angina pectoris: Secondary | ICD-10-CM

## 2023-06-27 LAB — TYPE AND SCREEN
ABO/RH(D): O POS
Antibody Screen: NEGATIVE

## 2023-06-27 LAB — ABO/RH: ABO/RH(D): O POS

## 2023-06-27 MED ORDER — TAMSULOSIN HCL 0.4 MG PO CAPS
0.4000 mg | ORAL_CAPSULE | Freq: Every day | ORAL | Status: DC
  Administered 2023-06-27 – 2023-06-29 (×3): 0.4 mg via ORAL
  Filled 2023-06-27 (×3): qty 1

## 2023-06-27 MED ORDER — PHENYLEPHRINE HCL (PRESSORS) 10 MG/ML IV SOLN
INTRAVENOUS | Status: AC
  Filled 2023-06-27: qty 1

## 2023-06-27 MED ORDER — LIDOCAINE HCL (CARDIAC) PF 100 MG/5ML IV SOSY
PREFILLED_SYRINGE | INTRAVENOUS | Status: DC | PRN
  Administered 2023-06-27: 100 mg via INTRATRACHEAL

## 2023-06-27 MED ORDER — SODIUM CHLORIDE 0.9 % IV SOLN: INTRAVENOUS | Status: DC

## 2023-06-27 MED ORDER — DROPERIDOL 2.5 MG/ML IJ SOLN: 0.6250 mg | Freq: Once | INTRAMUSCULAR | Status: DC | PRN

## 2023-06-27 MED ORDER — PHENYLEPHRINE 80 MCG/ML (10ML) SYRINGE FOR IV PUSH (FOR BLOOD PRESSURE SUPPORT)
PREFILLED_SYRINGE | INTRAVENOUS | Status: AC
  Filled 2023-06-27: qty 10

## 2023-06-27 MED ORDER — ONDANSETRON HCL 4 MG/2ML IJ SOLN
INTRAMUSCULAR | Status: AC
  Filled 2023-06-27: qty 2

## 2023-06-27 MED ORDER — MENTHOL 3 MG MT LOZG: 1.0000 | LOZENGE | OROMUCOSAL | Status: DC | PRN

## 2023-06-27 MED ORDER — POVIDONE-IODINE 10 % EX SWAB
2.0000 | Freq: Once | CUTANEOUS | Status: AC
  Administered 2023-06-27: 2 via TOPICAL

## 2023-06-27 MED ORDER — ONDANSETRON HCL 4 MG/2ML IJ SOLN
INTRAMUSCULAR | Status: DC | PRN
  Administered 2023-06-27: 4 mg via INTRAVENOUS

## 2023-06-27 MED ORDER — HYDROMORPHONE HCL 1 MG/ML IJ SOLN: 0.5000 mg | INTRAMUSCULAR | Status: DC | PRN

## 2023-06-27 MED ORDER — OXYCODONE HCL 5 MG/5ML PO SOLN: 5.0000 mg | Freq: Once | ORAL | Status: AC | PRN

## 2023-06-27 MED ORDER — PANTOPRAZOLE SODIUM 40 MG PO TBEC
40.0000 mg | DELAYED_RELEASE_TABLET | Freq: Every day | ORAL | Status: DC
  Administered 2023-06-27 – 2023-06-30 (×4): 40 mg via ORAL
  Filled 2023-06-27 (×4): qty 1

## 2023-06-27 MED ORDER — ACETAMINOPHEN 325 MG PO TABS
325.0000 mg | ORAL_TABLET | Freq: Four times a day (QID) | ORAL | Status: DC | PRN
  Administered 2023-06-28 – 2023-06-29 (×2): 650 mg via ORAL
  Filled 2023-06-27 (×3): qty 2

## 2023-06-27 MED ORDER — LIDOCAINE HCL (PF) 2 % IJ SOLN
INTRAMUSCULAR | Status: AC
  Filled 2023-06-27: qty 5

## 2023-06-27 MED ORDER — ORAL CARE MOUTH RINSE: 15.0000 mL | Freq: Once | OROMUCOSAL | Status: AC

## 2023-06-27 MED ORDER — 0.9 % SODIUM CHLORIDE (POUR BTL) OPTIME
TOPICAL | Status: DC | PRN
  Administered 2023-06-27: 1000 mL

## 2023-06-27 MED ORDER — ONDANSETRON HCL 4 MG PO TABS: 4.0000 mg | ORAL_TABLET | Freq: Four times a day (QID) | ORAL | Status: DC | PRN

## 2023-06-27 MED ORDER — APIXABAN 5 MG PO TABS
5.0000 mg | ORAL_TABLET | Freq: Two times a day (BID) | ORAL | Status: DC
  Administered 2023-06-28 – 2023-06-30 (×5): 5 mg via ORAL
  Filled 2023-06-27 (×5): qty 1

## 2023-06-27 MED ORDER — CHLORHEXIDINE GLUCONATE 0.12 % MT SOLN
15.0000 mL | Freq: Once | OROMUCOSAL | Status: AC
  Administered 2023-06-27: 15 mL via OROMUCOSAL

## 2023-06-27 MED ORDER — METHOCARBAMOL 500 MG PO TABS
500.0000 mg | ORAL_TABLET | Freq: Four times a day (QID) | ORAL | Status: DC | PRN
  Administered 2023-06-27 – 2023-06-29 (×3): 500 mg via ORAL
  Filled 2023-06-27 (×3): qty 1

## 2023-06-27 MED ORDER — ALUM & MAG HYDROXIDE-SIMETH 200-200-20 MG/5ML PO SUSP: 30.0000 mL | ORAL | Status: DC | PRN

## 2023-06-27 MED ORDER — OXYCODONE HCL 5 MG PO TABS: 10.0000 mg | ORAL_TABLET | ORAL | Status: DC | PRN

## 2023-06-27 MED ORDER — SODIUM CHLORIDE 0.9 % IR SOLN
Status: DC | PRN
  Administered 2023-06-27: 1000 mL

## 2023-06-27 MED ORDER — METHOCARBAMOL 1000 MG/10ML IJ SOLN: 500.0000 mg | Freq: Four times a day (QID) | INTRAMUSCULAR | Status: DC | PRN

## 2023-06-27 MED ORDER — LACTATED RINGERS IV SOLN: INTRAVENOUS | Status: DC

## 2023-06-27 MED ORDER — PROPOFOL 500 MG/50ML IV EMUL
INTRAVENOUS | Status: DC | PRN
  Administered 2023-06-27: 75 ug/kg/min via INTRAVENOUS

## 2023-06-27 MED ORDER — CEFAZOLIN SODIUM-DEXTROSE 2-4 GM/100ML-% IV SOLN
2.0000 g | INTRAVENOUS | Status: AC
  Administered 2023-06-27: 2 g via INTRAVENOUS
  Filled 2023-06-27: qty 100

## 2023-06-27 MED ORDER — OXYCODONE HCL 5 MG PO TABS
ORAL_TABLET | ORAL | Status: AC
  Administered 2023-06-27: 5 mg via ORAL
  Filled 2023-06-27: qty 1

## 2023-06-27 MED ORDER — FENTANYL CITRATE PF 50 MCG/ML IJ SOSY
PREFILLED_SYRINGE | INTRAMUSCULAR | Status: AC
  Administered 2023-06-27: 50 ug via INTRAVENOUS
  Filled 2023-06-27: qty 2

## 2023-06-27 MED ORDER — STERILE WATER FOR IRRIGATION IR SOLN
Status: DC | PRN
  Administered 2023-06-27: 1000 mL

## 2023-06-27 MED ORDER — METOCLOPRAMIDE HCL 5 MG PO TABS: 5.0000 mg | ORAL_TABLET | Freq: Three times a day (TID) | ORAL | Status: DC | PRN

## 2023-06-27 MED ORDER — FENTANYL CITRATE PF 50 MCG/ML IJ SOSY
25.0000 ug | PREFILLED_SYRINGE | INTRAMUSCULAR | Status: DC | PRN
  Administered 2023-06-27: 50 ug via INTRAVENOUS

## 2023-06-27 MED ORDER — OXYCODONE HCL 5 MG PO TABS
5.0000 mg | ORAL_TABLET | ORAL | Status: DC | PRN
  Administered 2023-06-27: 5 mg via ORAL
  Administered 2023-06-27: 10 mg via ORAL
  Administered 2023-06-28 – 2023-06-29 (×4): 5 mg via ORAL
  Administered 2023-06-30: 10 mg via ORAL
  Filled 2023-06-27: qty 1
  Filled 2023-06-27: qty 2
  Filled 2023-06-27 (×4): qty 1
  Filled 2023-06-27: qty 2

## 2023-06-27 MED ORDER — DEXMEDETOMIDINE HCL IN NACL 80 MCG/20ML IV SOLN
INTRAVENOUS | Status: AC
  Filled 2023-06-27: qty 20

## 2023-06-27 MED ORDER — ACETAMINOPHEN 500 MG PO TABS: 1000.0000 mg | ORAL_TABLET | Freq: Once | ORAL | Status: DC

## 2023-06-27 MED ORDER — OXYCODONE HCL 5 MG PO TABS: 5.0000 mg | ORAL_TABLET | Freq: Once | ORAL | Status: AC | PRN

## 2023-06-27 MED ORDER — DIPHENHYDRAMINE HCL 12.5 MG/5ML PO ELIX: 12.5000 mg | ORAL_SOLUTION | ORAL | Status: DC | PRN

## 2023-06-27 MED ORDER — BUPIVACAINE IN DEXTROSE 0.75-8.25 % IT SOLN
INTRATHECAL | Status: DC | PRN
  Administered 2023-06-27: 1.6 mL via INTRATHECAL

## 2023-06-27 MED ORDER — PHENYLEPHRINE HCL-NACL 20-0.9 MG/250ML-% IV SOLN
INTRAVENOUS | Status: DC | PRN
  Administered 2023-06-27: 50 ug/min via INTRAVENOUS

## 2023-06-27 MED ORDER — ONDANSETRON HCL 4 MG/2ML IJ SOLN: 4.0000 mg | Freq: Four times a day (QID) | INTRAMUSCULAR | Status: DC | PRN

## 2023-06-27 MED ORDER — CEFAZOLIN SODIUM-DEXTROSE 2-4 GM/100ML-% IV SOLN
2.0000 g | Freq: Four times a day (QID) | INTRAVENOUS | Status: AC
  Administered 2023-06-27 (×2): 2 g via INTRAVENOUS
  Filled 2023-06-27 (×2): qty 100

## 2023-06-27 MED ORDER — METOCLOPRAMIDE HCL 5 MG/ML IJ SOLN: 5.0000 mg | Freq: Three times a day (TID) | INTRAMUSCULAR | Status: DC | PRN

## 2023-06-27 MED ORDER — PROPOFOL 1000 MG/100ML IV EMUL
INTRAVENOUS | Status: AC
  Filled 2023-06-27: qty 100

## 2023-06-27 MED ORDER — PHENOL 1.4 % MT LIQD: 1.0000 | OROMUCOSAL | Status: DC | PRN

## 2023-06-27 MED ORDER — METHOCARBAMOL 500 MG PO TABS
ORAL_TABLET | ORAL | Status: AC
  Administered 2023-06-27: 500 mg via ORAL
  Filled 2023-06-27: qty 1

## 2023-06-27 MED ORDER — TRANEXAMIC ACID-NACL 1000-0.7 MG/100ML-% IV SOLN
1000.0000 mg | INTRAVENOUS | Status: AC
  Administered 2023-06-27: 1000 mg via INTRAVENOUS
  Filled 2023-06-27: qty 100

## 2023-06-27 MED ORDER — DOCUSATE SODIUM 100 MG PO CAPS
100.0000 mg | ORAL_CAPSULE | Freq: Two times a day (BID) | ORAL | Status: DC
  Administered 2023-06-27 – 2023-06-30 (×6): 100 mg via ORAL
  Filled 2023-06-27 (×7): qty 1

## 2023-06-27 NOTE — Interval H&P Note (Signed)
 History and Physical Interval Note: The patient understands that he is here today for a left total hip replacement to treat his significant left hip pain and arthritis.  There has been no acute or interval change in his medical status.  The risks and benefits of surgery have been discussed in detail and informed consent has been obtained.  The left operative hip has been marked.  06/27/2023 8:38 AM  Scott Schneider  has presented today for surgery, with the diagnosis of Left Hip Osteoarthritis.  The various methods of treatment have been discussed with the patient and family. After consideration of risks, benefits and other options for treatment, the patient has consented to  Procedure(s): ARTHROPLASTY, HIP, TOTAL, ANTERIOR APPROACH (Left) as a surgical intervention.  The patient's history has been reviewed, patient examined, no change in status, stable for surgery.  I have reviewed the patient's chart and labs.  Questions were answered to the patient's satisfaction.     [name]

## 2023-06-27 NOTE — Op Note (Signed)
 Operative Note  Date of operation: 06/27/2023 Preoperative diagnosis: Left hip primary osteoarthritis Postoperative diagnosis: Same  Procedure: Left direct anterior total hip arthroplasty  Implants: Implant Name Type Inv. Item Serial No. Manufacturer Lot No. LRB No. Used Action  CUP Wendi Ham - [id number] Orthopedic Implant CUP GRIPTION SECTOR  DEPUY ORTHOPAEDICS [id number] Left 1 Implanted  LINER NEUTRAL 62MMC36MM P4 - [id number] Liner LINER NEUTRAL 62MMC36MM P4  DEPUY ORTHOPAEDICS [id number] Left 1 Implanted  STEM FEM ACTIS HIGH SZ7 - [id number] Stem STEM FEM ACTIS HIGH SZ7  DEPUY ORTHOPAEDICS [id number] Left 1 Implanted  HIP BALL ARTICU EZE 36 8.5 - [id number] Hips HIP BALL ARTICU EZE 36 8.5  DEPUY ORTHOPAEDICS [id number] Left 1 Implanted   Surgeon: Jeanella Milan. Asenath Blacker, MD Assistant: Rozanna Corner, RNFA  Anesthesia: Spinal EBL: 150 cc Antibiotics: IV Ancef  Complications: None  Indications: The patient is an active 83 year old gentleman with debilitating arthritis involving both of his hips with the left hip significantly worse in the right hip.  He has daily severe hip pain that is detrimentally affecting his mobility, his quality of life and his actives of daily living.  This has been going on for many years now and at this point he does wish to proceed with a hip replacement and we agree with this as well based on the failure of conservative treatment combined with his daily hip pain and his clinical exam and x-ray findings.  We did discuss the risks of acute blood loss anemia, neurovascular injury, fracture, infection, DVT, implant failure, dislocation, leg length differences and wound healing issues.  He understands that our goals are hopefully decreased pain, improved mobility and improved quality of life.  Procedure description: After informed consent was obtained and the appropriate left hip was marked, the patient brought to the operating room and set up on the stretcher  where spinal anesthesia was obtained.  He was then laid in the supine position on the stretcher and a Foley catheter was placed.  Traction boots were placed on both his feet and next he was placed supine on the Hana fracture table with a perineal post in place and both legs in inline skeletal traction devices and no traction applied.  The left operative hip and pelvis were assessed radiographically.  The left hip was then prepped and draped with DuraPrep and sterile drapes.  A timeout was called and he was identified as the correct patient and the correct left hip.  An incision was then made just inferior and posterior to the ASIS and carried slightly obliquely down the leg.  Dissection was carried down to the tensor fascia lata muscle and the tensor fascia was divided longitudinally to proceed with a direct image approach of the hip.  Circumflex vessels were identified and cauterized.  The hip capsule was identified and opened up in an L-type format finding a large joint effusion.  Cobra retractors were placed around the medial and lateral femoral neck and a femoral neck cut was made with an oscillating saw just proximal to the lesser trochanter and the scope was completed with an osteotome.  A corkscrew guide was placed in the femoral head and the femoral head was removed in its entirety.  There is a very large femoral head with significant osteophytes and complete loss of cartilage.  A bent Hohmann was then placed over the medial acetabular rim and remnants of the acetabular labrum noted to be removed.  Reaming was then initiated from a size 43 reamer going all the  way up to a size 61 reamer with the last reamer also placed under direct fluoroscopy and direct visualization.  The real DePuy sector GRIPTION acetabular component size 62 was then placed without difficulty followed by a 36+4 polythene liner.  Attention was then turned to the femur.  With the left leg actually rotated to 120 degrees, extended and  abducted, a Mueller retractor was placed medially and a Hohmann retractor behind the greater trochanter.  The lateral joint capsule was released and a box cutting osteotome was used into the femoral canal.  Broaching was then initiated using the axis broaching system from a size 0 going to a size 6.  With size 6 in place we trialed a Howell several neck and a 36+1.5 trial head ball.  This was reduced easily and acetabulum and based on radiographic and clinical assessment we needed more leg length and some offset.  We dislocated the hip remove the trial components.  We then broached up to a size 7 femoral component and then we placed the real Actis size 7 femoral component with high offset and went with the real 36+8.5 metal head ball.  This was reduced in the acetabulum and were pleased with leg length, offset, range of motion and stability assessed clinically and radiographically.  The soft tissue was then irrigated using normal saline solution.  Remnants of the joint capsule were closed with interrupted #1 Ethibond suture followed by a 2-0 Vicryl to close the tensor fascia.  0 Vicryl was used to close the deep tissue and 2-0 Vicryl was used to close subcutaneous tissue.  The skin was closed with staples.  The patient was taken off of the Hana table and taken the recovery room.

## 2023-06-27 NOTE — Transfer of Care (Addendum)
 Immediate Anesthesia Transfer of Care Note  Patient: Scott Schneider  Procedure(s) Performed: ARTHROPLASTY, HIP, TOTAL, ANTERIOR APPROACH (Left: Hip)  Patient Location: PACU  Anesthesia Type:MAC and Spinal  Level of Consciousness: sedated and responds to stimulation  Airway & Oxygen Therapy: Patient Spontanous Breathing and Patient connected to nasal cannula oxygen  Post-op Assessment: Report given to RN and Post -op Vital signs reviewed and stable  Post vital signs: Reviewed and stable  Last Vitals:  Vitals Value Taken Time  BP 104/62 06/27/23 1201  Temp 36.8 1201  Pulse 59 06/27/23 1203  Resp 15 06/27/23 1203  SpO2 100 % 06/27/23 1203  Vitals shown include unfiled device data.  Last Pain:  Vitals:   06/27/23 0751  TempSrc:   PainSc: 0-No pain         Complications: No notable events documented.

## 2023-06-27 NOTE — Plan of Care (Signed)
   Problem: Coping: Goal: Level of anxiety will decrease Outcome: Progressing   Problem: Pain Managment: Goal: General experience of comfort will improve and/or be controlled Outcome: Progressing   Problem: Safety: Goal: Ability to remain free from injury will improve Outcome: Progressing

## 2023-06-27 NOTE — Plan of Care (Signed)
  Problem: Nutrition: Goal: Adequate nutrition will be maintained Outcome: Progressing   Problem: Coping: Goal: Level of anxiety will decrease Outcome: Progressing   Problem: Pain Managment: Goal: General experience of comfort will improve and/or be controlled Outcome: Progressing   Problem: Safety: Goal: Ability to remain free from injury will improve Outcome: Progressing   Problem: Pain Management: Goal: Pain level will decrease with appropriate interventions Outcome: Progressing

## 2023-06-27 NOTE — Anesthesia Procedure Notes (Signed)
 Procedure Name: MAC Date/Time: 06/27/2023 10:30 AM  Performed by: Darlena Ego, CRNAPre-anesthesia Checklist: Patient identified, Emergency Drugs available, Suction available, Patient being monitored and Timeout performed Patient Re-evaluated:Patient Re-evaluated prior to induction Preoxygenation: Pre-oxygenation with 100% oxygen Induction Type: IV induction Ventilation: Oral airway inserted - appropriate to patient size

## 2023-06-27 NOTE — Anesthesia Procedure Notes (Signed)
 Spinal  Patient location during procedure: OR Start time: 06/27/2023 10:22 AM End time: 06/27/2023 10:25 AM Reason for block: surgical anesthesia Staffing Performed: anesthesiologist  Anesthesiologist: Vernadine Golas, MD Performed by: Vernadine Golas, MD Authorized by: Vernadine Golas, MD   Preanesthetic Checklist Completed: patient identified, IV checked, risks and benefits discussed, surgical consent, monitors and equipment checked, pre-op evaluation and timeout performed Spinal Block Patient position: sitting Prep: DuraPrep and site prepped and draped Patient monitoring: continuous pulse ox, blood pressure and heart rate Approach: midline Location: L3-4 Injection technique: single-shot Needle Needle type: Pencan  Needle gauge: 24 G Needle length: 9 cm Assessment Events: CSF return Additional Notes Risks, benefits, and alternative discussed. Patient gave consent to procedure. Prepped and draped in sitting position. Patient sedated but responsive to voice. Clear CSF obtained after one needle pass. Positive terminal aspiration. No pain or paraesthesias with injection. Patient tolerated procedure well. Vital signs stable. Amador Junes, MD

## 2023-06-27 NOTE — Discharge Instructions (Signed)
 Per [facility] clinic policy, our goal is ensure optimal postoperative pain control with a multimodal pain management strategy. For all [facility] patients, our goal is to wean post-operative narcotic medications by 6 weeks post-operatively. If this is not p

## 2023-06-27 NOTE — Anesthesia Postprocedure Evaluation (Signed)
 Anesthesia Post Note  Patient: Scott Schneider  Procedure(s) Performed: ARTHROPLASTY, HIP, TOTAL, ANTERIOR APPROACH (Left: Hip)     Patient location during evaluation: PACU Anesthesia Type: Spinal Level of consciousness: awake and alert Pain management: pain level controlled Vital Signs Assessment: post-procedure vital signs reviewed and stable Respiratory status: spontaneous breathing, nonlabored ventilation and respiratory function stable Cardiovascular status: blood pressure returned to baseline Postop Assessment: no apparent nausea or vomiting, spinal receding, no headache and no backache Anesthetic complications: no   No notable events documented.  Last Vitals:  Vitals:   06/27/23 1300 06/27/23 1315  BP: 122/69 118/68  Pulse: 66 (!) 56  Resp: (!) 23 12  Temp:    SpO2: 100% 100%    Last Pain:  Vitals:   06/27/23 1315  TempSrc:   PainSc: 3                  Rayfield Cairo

## 2023-06-28 DIAGNOSIS — Z86711 Personal history of pulmonary embolism: Secondary | ICD-10-CM | POA: Diagnosis not present

## 2023-06-28 DIAGNOSIS — Z9181 History of falling: Secondary | ICD-10-CM | POA: Diagnosis not present

## 2023-06-28 DIAGNOSIS — R54 Age-related physical debility: Secondary | ICD-10-CM | POA: Diagnosis present

## 2023-06-28 DIAGNOSIS — Z8051 Family history of malignant neoplasm of kidney: Secondary | ICD-10-CM | POA: Diagnosis not present

## 2023-06-28 DIAGNOSIS — Z833 Family history of diabetes mellitus: Secondary | ICD-10-CM | POA: Diagnosis not present

## 2023-06-28 DIAGNOSIS — Z7901 Long term (current) use of anticoagulants: Secondary | ICD-10-CM | POA: Diagnosis not present

## 2023-06-28 DIAGNOSIS — Z86718 Personal history of other venous thrombosis and embolism: Secondary | ICD-10-CM | POA: Diagnosis not present

## 2023-06-28 DIAGNOSIS — Z8049 Family history of malignant neoplasm of other genital organs: Secondary | ICD-10-CM | POA: Diagnosis not present

## 2023-06-28 DIAGNOSIS — I2510 Atherosclerotic heart disease of native coronary artery without angina pectoris: Secondary | ICD-10-CM | POA: Diagnosis present

## 2023-06-28 DIAGNOSIS — E785 Hyperlipidemia, unspecified: Secondary | ICD-10-CM | POA: Diagnosis present

## 2023-06-28 DIAGNOSIS — Z803 Family history of malignant neoplasm of breast: Secondary | ICD-10-CM | POA: Diagnosis not present

## 2023-06-28 DIAGNOSIS — M1612 Unilateral primary osteoarthritis, left hip: Secondary | ICD-10-CM | POA: Diagnosis present

## 2023-06-28 DIAGNOSIS — Z79899 Other long term (current) drug therapy: Secondary | ICD-10-CM | POA: Diagnosis not present

## 2023-06-28 DIAGNOSIS — Z825 Family history of asthma and other chronic lower respiratory diseases: Secondary | ICD-10-CM | POA: Diagnosis not present

## 2023-06-28 DIAGNOSIS — Z8349 Family history of other endocrine, nutritional and metabolic diseases: Secondary | ICD-10-CM | POA: Diagnosis not present

## 2023-06-28 DIAGNOSIS — I252 Old myocardial infarction: Secondary | ICD-10-CM | POA: Diagnosis not present

## 2023-06-28 DIAGNOSIS — Z8673 Personal history of transient ischemic attack (TIA), and cerebral infarction without residual deficits: Secondary | ICD-10-CM | POA: Diagnosis not present

## 2023-06-28 DIAGNOSIS — Z8546 Personal history of malignant neoplasm of prostate: Secondary | ICD-10-CM | POA: Diagnosis not present

## 2023-06-28 LAB — BASIC METABOLIC PANEL WITH GFR
Anion gap: 6 (ref 5–15)
BUN: 9 mg/dL (ref 8–23)
CO2: 24 mmol/L (ref 22–32)
Calcium: 8.1 mg/dL — ABNORMAL LOW (ref 8.9–10.3)
Chloride: 102 mmol/L (ref 98–111)
Creatinine, Ser: 0.79 mg/dL (ref 0.61–1.24)
GFR, Estimated: >60 mL/min
Glucose, Bld: 164 mg/dL — ABNORMAL HIGH (ref 70–99)
Potassium: 3.6 mmol/L (ref 3.5–5.1)
Sodium: 132 mmol/L — ABNORMAL LOW (ref 135–145)

## 2023-06-28 LAB — CBC
HCT: 34.1 % — ABNORMAL LOW (ref 39.0–52.0)
Hemoglobin: 11 g/dL — ABNORMAL LOW (ref 13.0–17.0)
MCH: 29.1 pg (ref 26.0–34.0)
MCHC: 32.3 g/dL (ref 30.0–36.0)
MCV: 90.2 fL (ref 80.0–100.0)
Platelets: 218 10*3/uL (ref 150–400)
RBC: 3.78 MIL/uL — ABNORMAL LOW (ref 4.22–5.81)
RDW: 12.7 % (ref 11.5–15.5)
WBC: 7.1 10*3/uL (ref 4.0–10.5)
nRBC: 0 % (ref 0.0–0.2)

## 2023-06-28 NOTE — Progress Notes (Signed)
 Physical Therapist reports orthostatic pressures during session; see vital signs documentation. MD aware of slow progression and advises patient will remain in hospital over the next day or so. Ara Knee, RN 06/28/23 10:42 AM

## 2023-06-28 NOTE — Progress Notes (Signed)
 Physical Therapy Treatment Patient Details Name: Scott Schneider MRN: [id number] DOB: December 28, 1940 Today's Date: 06/28/2023   History of Present Illness Pt s/p L THR and with hx of TIA, spinal stenosis, CAD, and NSTEMI    PT Comments  Pt continues very cooperative but pain limited and mildly orthostatic (but not symptomatic).  Pt assisted from chair to ambulate limited distance in hall and back to bed.  BP supine 130/61; sitting 112/56 standing 103/54 and after ambulating short distance 118/57.   If plan is discharge home, recommend the following: A lot of help with walking and/or transfers;A little help with bathing/dressing/bathroom;Assistance with cooking/housework;Assist for transportation;Help with stairs or ramp for entrance   Can travel by private vehicle        Equipment Recommendations  Rolling walker (2 wheels)    Recommendations for Other Services       Precautions / Restrictions Precautions Precautions: Fall Restrictions Weight Bearing Restrictions Per Provider Order: No LLE Weight Bearing Per Provider Order: Weight bearing as tolerated     Mobility  Bed Mobility Overal bed mobility: Needs Assistance Bed Mobility: Sit to Supine     Supine to sit: Min assist, +2 for physical assistance, +2 for safety/equipment, Used rails Sit to supine: Mod assist   General bed mobility comments: increased time with cues for sequence and use of R LE to self assist    Transfers Overall transfer level: Needs assistance Equipment used: Rolling walker (2 wheels) Transfers: Sit to/from Stand Sit to Stand: From elevated surface, Min assist, Mod assist   Step pivot transfers: Min assist, +2 physical assistance, +2 safety/equipment       General transfer comment: cues for LE management and use of UEs to self assist    Ambulation/Gait Ambulation/Gait assistance: Min assist Gait Distance (Feet): 38 Feet Assistive device: Rolling walker (2 wheels) Gait Pattern/deviations: Step-to  pattern, Decreased step length - right, Decreased step length - left, Shuffle, Trunk flexed Gait velocity: decr     General Gait Details: cues for sequence, posture and position from [employer]             Wheelchair Mobility     Tilt Bed    Modified Rankin (Stroke Patients Only)       Balance Overall balance assessment: Needs assistance Sitting-balance support: No upper extremity supported, Feet supported Sitting balance-Leahy Scale: Good     Standing balance support: Bilateral upper extremity supported Standing balance-Leahy Scale: Poor                              Communication Communication Communication: No apparent difficulties  Cognition Arousal: Alert Behavior During Therapy: WFL for tasks assessed/performed   PT - Cognitive impairments: No apparent impairments                         Following commands: Intact      Cueing    Exercises Total Joint Exercises Ankle Circles/Pumps: AROM, Both, 15 reps, Supine Quad Sets: AROM, Both, 10 reps, Supine Heel Slides: AAROM, Left, 20 reps, Supine Hip ABduction/ADduction: AAROM, Left, 15 reps, Supine    General Comments        Pertinent Vitals/Pain Pain Assessment Pain Assessment: 0-10 Pain Score: 7  Pain Location: L hip Pain Descriptors / Indicators: Aching, Grimacing, Guarding, Sore Pain Intervention(s): Limited activity within patient's tolerance, Monitored during session, Premedicated before session, Ice applied    Home Living Family/patient  expects to be discharged to:: Private residence Living Arrangements: Alone Available Help at Discharge: Family;Available PRN/intermittently Type of Home: House Home Access: Stairs to enter   Entrance Stairs-Number of Steps: 1   Home Layout: One level Home Equipment: Rollator (4 wheels);Shower seat      Prior Function            PT Goals (current goals can now be found in the care plan section) Acute Rehab PT Goals Patient  Stated Goal: Regain IND PT Goal Formulation: With patient Time For Goal Achievement: 07/05/23 Potential to Achieve Goals: Good Progress towards PT goals: Progressing toward goals    Frequency    7X/week      PT Plan      Co-evaluation              AM-PAC PT "6 Clicks" Mobility   Outcome Measure  Help needed turning from your back to your side while in a flat bed without using bedrails?: A Little Help needed moving from lying on your back to sitting on the side of a flat bed without using bedrails?: A Little Help needed moving to and from a bed to a chair (including a wheelchair)?: A Little Help needed standing up from a chair using your arms (e.g., wheelchair or bedside chair)?: A Little Help needed to walk in hospital room?: A Little Help needed climbing 3-5 steps with a railing? : A Lot 6 Click Score: 17    End of Session Equipment Utilized During Treatment: Gait belt Activity Tolerance: Patient tolerated treatment well Patient left: in bed;with call bell/phone within reach;with nursing/sitter in room;with bed alarm set Nurse Communication: Mobility status PT Visit Diagnosis: Difficulty in walking, not elsewhere classified (R26.2)     Time: 1610-9604 PT Time Calculation (min) (ACUTE ONLY): 28 min  Charges:    $Gait Training: 8-22 mins $Therapeutic Exercise: 8-22 mins $Therapeutic Activity: 8-22 mins PT General Charges $$ ACUTE PT VISIT: 1 Visit                     [name] PT Acute Rehabilitation Services Pager [phone] Office [phone]    [name] 06/28/2023, 1:48 PM

## 2023-06-28 NOTE — Evaluation (Signed)
 Physical Therapy Evaluation Patient Details Name: Scott Schneider MRN: [id number] DOB: 09/14/1940 Today's Date: 06/28/2023  History of Present Illness  Pt s/p L THR and with hx of TIA, spinal stenosis, CAD, and NSTEMI  Clinical Impression  Pt s/p L THR and presents with decreased L LE strength/ROM, post op pain and orthostatic BP limiting functional mobility - BP supine127/61; sitting 117/48; and standing 103/54 with pt reporting increasing dizziness and transferred back to sitting.  Pt should progress to dc home - states he is still working on increased assist at home for time of dc.        If plan is discharge home, recommend the following: A lot of help with walking and/or transfers;A little help with bathing/dressing/bathroom;Assistance with cooking/housework;Assist for transportation;Help with stairs or ramp for entrance   Can travel by private vehicle        Equipment Recommendations Rolling walker (2 wheels)  Recommendations for Other Services       Functional Status Assessment Patient has had a recent decline in their functional status and demonstrates the ability to make significant improvements in function in a reasonable and predictable amount of time.     Precautions / Restrictions Precautions Precautions: Fall Restrictions Weight Bearing Restrictions Per Provider Order: No LLE Weight Bearing Per Provider Order: Weight bearing as tolerated      Mobility  Bed Mobility Overal bed mobility: Needs Assistance Bed Mobility: Supine to Sit     Supine to sit: Min assist, +2 for physical assistance, +2 for safety/equipment, Used rails     General bed mobility comments: increased time with cues for sequence and use of R LE to self assist    Transfers Overall transfer level: Needs assistance Equipment used: Rolling walker (2 wheels) Transfers: Sit to/from Stand, Bed to chair/wheelchair/BSC Sit to Stand: Min assist, +2 physical assistance, +2 safety/equipment, From  elevated surface   Step pivot transfers: Min assist, +2 physical assistance, +2 safety/equipment       General transfer comment: cues for LE management and use of UEs to self assist    Ambulation/Gait         Gait velocity: decr     General Gait Details: step pvt bed to chair only2* dropping BP and onset of dizziness  Stairs            Wheelchair Mobility     Tilt Bed    Modified Rankin (Stroke Patients Only)       Balance Overall balance assessment: Needs assistance Sitting-balance support: No upper extremity supported, Feet supported Sitting balance-Leahy Scale: Good     Standing balance support: Bilateral upper extremity supported Standing balance-Leahy Scale: Poor                               Pertinent Vitals/Pain Pain Assessment Pain Assessment: 0-10 Pain Score: 8  Pain Location: L hip Pain Descriptors / Indicators: Aching, Grimacing, Guarding, Sore Pain Intervention(s): Limited activity within patient's tolerance, Monitored during session, Premedicated before session, Ice applied, Patient requesting pain meds-RN notified    Home Living Family/patient expects to be discharged to:: Private residence Living Arrangements: Alone Available Help at Discharge: Family;Available PRN/intermittently Type of Home: House Home Access: Stairs to enter   Entrance Stairs-Number of Steps: 1   Home Layout: One level Home Equipment: Rollator (4 wheels);Shower seat      Prior Function Prior Level of Function : Independent/Modified Independent  Mobility Comments: using rollator for most mobility 2* hip pain       Extremity/Trunk Assessment   Upper Extremity Assessment Upper Extremity Assessment: Overall WFL for tasks assessed    Lower Extremity Assessment Lower Extremity Assessment: LLE deficits/detail LLE Deficits / Details: AAROM at hip to 80 flex and 10 abd; 2/5 strength at hip    Cervical / Trunk Assessment Cervical  / Trunk Assessment: Normal  Communication   Communication Communication: No apparent difficulties    Cognition Arousal: Alert Behavior During Therapy: WFL for tasks assessed/performed   PT - Cognitive impairments: No apparent impairments                         Following commands: Intact       Cueing       General Comments      Exercises Total Joint Exercises Ankle Circles/Pumps: AROM, Both, 15 reps, Supine Quad Sets: AROM, Both, 10 reps, Supine Heel Slides: AAROM, Left, 20 reps, Supine Hip ABduction/ADduction: AAROM, Left, 15 reps, Supine   Assessment/Plan    PT Assessment Patient needs continued PT services  PT Problem List Decreased strength;Decreased range of motion;Decreased activity tolerance;Decreased balance;Decreased mobility;Decreased knowledge of use of DME;Pain       PT Treatment Interventions DME instruction;Gait training;Stair training;Functional mobility training;Therapeutic activities;Therapeutic exercise;Balance training;Patient/family education    PT Goals (Current goals can be found in the Care Plan section)  Acute Rehab PT Goals Patient Stated Goal: Regain IND PT Goal Formulation: With patient Time For Goal Achievement: 07/05/23 Potential to Achieve Goals: Good    Frequency 7X/week     Co-evaluation               AM-PAC PT "6 Clicks" Mobility  Outcome Measure Help needed turning from your back to your side while in a flat bed without using bedrails?: A Little Help needed moving from lying on your back to sitting on the side of a flat bed without using bedrails?: A Little Help needed moving to and from a bed to a chair (including a wheelchair)?: A Lot Help needed standing up from a chair using your arms (e.g., wheelchair or bedside chair)?: A Lot Help needed to walk in hospital room?: Total Help needed climbing 3-5 steps with a railing? : Total 6 Click Score: 12    End of Session Equipment Utilized During Treatment: Gait  belt Activity Tolerance: Patient tolerated treatment well Patient left: in chair;with call bell/phone within reach;with bed alarm set;with family/visitor present Nurse Communication: Mobility status PT Visit Diagnosis: Difficulty in walking, not elsewhere classified (R26.2)    Time: 1610-9604 PT Time Calculation (min) (ACUTE ONLY): 30 min   Charges:   PT Evaluation $PT Eval Low Complexity: 1 Low PT Treatments $Therapeutic Exercise: 8-22 mins PT General Charges $$ ACUTE PT VISIT: 1 Visit         [name] PT Acute Rehabilitation Services Pager [phone] Office [phone]   Arhum Peeples 06/28/2023, 11:10 AM

## 2023-06-28 NOTE — Progress Notes (Signed)
 Subjective: 1 Day Post-Op Procedure(s) (LRB): ARTHROPLASTY, HIP, TOTAL, ANTERIOR APPROACH (Left) Physical therapy is currently at the bedside work with the patient.  He is little orthostatic.  His hemoglobin is 11 so not a significant drop from preop level.  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  His signs are at the bedside.  Objective: Vital signs in last 24 hours: Temp:  [97.1 F (36.2 C)-99.2 F (37.3 C)] 98.7 F (37.1 C) (04/19 0957) Pulse Rate:  [36-78] 72 (04/19 0626) Resp:  [12-23] 16 (04/19 0957) BP: (102-137)/(54-69) 117/54 (04/19 0957) SpO2:  [99 %-100 %] 99 % (04/19 0957) Weight:  [88 kg] 88 kg (04/18 1900)  Intake/Output from previous day: 04/18 0701 - 04/19 0700 In: 3249 [P.O.:720; I.V.:2229; IV Piggyback:300] Out: 2100 [Urine:1950; Blood:150] Intake/Output this shift: Total I/O In: 240 [P.O.:240] Out: -   Recent Labs    06/28/23 0333  HGB 11.0*   Recent Labs    06/28/23 0333  WBC 7.1  RBC 3.78*  HCT 34.1*  PLT 218   Recent Labs    06/28/23 0333  NA 132*  K 3.6  CL 102  CO2 24  BUN 9  CREATININE 0.79  GLUCOSE 164*  CALCIUM  8.1*   No results for input(s): "LABPT", "INR" in the last 72 hours.    Assessment/Plan: 1 Day Post-Op Procedure(s) (LRB): ARTHROPLASTY, HIP, TOTAL, ANTERIOR APPROACH (Left) Up with therapy He has significant limitations in his mobility.  He does live alone at home and there is a son that can check on him who works third shift.  The plan will be to optimize therapy here is much as possible prior to discharge to home likely at the first week.  I will change him to an inpatient admission since he will be here through the weekend.  There is potential for needing to look into short-term skilled nursing.     [name] 06/28/2023, 10:23 AM

## 2023-06-29 MED ORDER — LATANOPROST 0.005 % OP SOLN
1.0000 [drp] | Freq: Every day | OPHTHALMIC | Status: DC
  Administered 2023-06-29: 1 [drp] via OPHTHALMIC
  Filled 2023-06-29: qty 2.5

## 2023-06-29 NOTE — Plan of Care (Signed)
  Problem: Coping: Goal: Level of anxiety will decrease Outcome: Progressing   Problem: Pain Management: Goal: Pain level will decrease with appropriate interventions Outcome: Progressing

## 2023-06-29 NOTE — Plan of Care (Signed)
   Problem: Coping: Goal: Level of anxiety will decrease Outcome: Progressing   Problem: Pain Managment: Goal: General experience of comfort will improve and/or be controlled Outcome: Progressing   Problem: Safety: Goal: Ability to remain free from injury will improve Outcome: Progressing

## 2023-06-29 NOTE — Progress Notes (Signed)
 Patient ID: Scott Schneider, male   DOB: [birth year], 83 y.o.   MRN: [id number] The patient is awake and alert this morning.  His vital signs are stable.  He did get slightly orthostatic yesterday in terms of his blood pressure when he got up but he was asymptomatic.  His left operative hip is stable.  The dressing has some drainage but is minimal.  Therapy will continue to work with the patient to maximize his mobility prior to discharge to home.

## 2023-06-29 NOTE — Progress Notes (Signed)
 Physical Therapy Treatment Patient Details Name: Scott Schneider MRN: [id number] DOB: 11-13-1940 Today's Date: 06/29/2023   History of Present Illness Pt s/p L THR and with hx of TIA, spinal stenosis, CAD, and NSTEMI    PT Comments  Pt continues very cooperative and with noted improvement in activity tolerance and decreasing level of assist for most tasks.  Pt hopeful for dc home tomorrow.    If plan is discharge home, recommend the following: A lot of help with walking and/or transfers;A little help with bathing/dressing/bathroom;Assistance with cooking/housework;Assist for transportation;Help with stairs or ramp for entrance   Can travel by private vehicle        Equipment Recommendations  Rolling walker (2 wheels)    Recommendations for Other Services       Precautions / Restrictions Precautions Precautions: Fall Restrictions Weight Bearing Restrictions Per Provider Order: No LLE Weight Bearing Per Provider Order: Weight bearing as tolerated     Mobility  Bed Mobility Overal bed mobility: Needs Assistance Bed Mobility: Supine to Sit     Supine to sit: Min assist     General bed mobility comments: Up in chair and requests back to same    Transfers Overall transfer level: Needs assistance Equipment used: Rolling walker (2 wheels) Transfers: Sit to/from Stand Sit to Stand: Min assist, Contact guard assist           General transfer comment: cues for LE management and use of UEs to self assist    Ambulation/Gait Ambulation/Gait assistance: Contact guard assist Gait Distance (Feet): 72 Feet Assistive device: Rolling walker (2 wheels) Gait Pattern/deviations: Step-to pattern, Decreased step length - right, Decreased step length - left, Shuffle, Trunk flexed Gait velocity: decr     General Gait Details: cues for sequence, posture and position from RW; distance ltd by pain/fatigue   Stairs             Wheelchair Mobility     Tilt Bed    Modified  Rankin (Stroke Patients Only)       Balance Overall balance assessment: Needs assistance Sitting-balance support: No upper extremity supported, Feet supported Sitting balance-Leahy Scale: Good     Standing balance support: Bilateral upper extremity supported Standing balance-Leahy Scale: Poor                              Communication Communication Communication: No apparent difficulties  Cognition Arousal: Alert Behavior During Therapy: WFL for tasks assessed/performed   PT - Cognitive impairments: No apparent impairments                         Following commands: Intact      Cueing    Exercises      General Comments        Pertinent Vitals/Pain Pain Assessment Pain Assessment: 0-10 Pain Score: 7  Pain Location: L hip Pain Descriptors / Indicators: Aching, Grimacing, Guarding, Sore Pain Intervention(s): Limited activity within patient's tolerance, Monitored during session, Premedicated before session, Ice applied    Home Living                          Prior Function            PT Goals (current goals can now be found in the care plan section) Acute Rehab PT Goals Patient Stated Goal: Regain IND PT Goal Formulation: With patient Time For Goal  Achievement: 07/05/23 Potential to Achieve Goals: Good Progress towards PT goals: Progressing toward goals    Frequency    7X/week      PT Plan      Co-evaluation              AM-PAC PT "6 Clicks" Mobility   Outcome Measure  Help needed turning from your back to your side while in a flat bed without using bedrails?: A Little Help needed moving from lying on your back to sitting on the side of a flat bed without using bedrails?: A Little Help needed moving to and from a bed to a chair (including a wheelchair)?: A Little Help needed standing up from a chair using your arms (e.g., wheelchair or bedside chair)?: A Little Help needed to walk in hospital room?: A  Little Help needed climbing 3-5 steps with a railing? : A Lot 6 Click Score: 17    End of Session Equipment Utilized During Treatment: Gait belt Activity Tolerance: Patient tolerated treatment well;Patient limited by pain Patient left: in chair;with call bell/phone within reach;with chair alarm set Nurse Communication: Mobility status PT Visit Diagnosis: Difficulty in walking, not elsewhere classified (R26.2)     Time: 0865-7846 PT Time Calculation (min) (ACUTE ONLY): 19 min  Charges:    $Gait Training: 8-22 mins PT General Charges $$ ACUTE PT VISIT: 1 Visit                     [name] PT Acute Rehabilitation Services Pager [phone] Office [phone]    Leiyah Maultsby 06/29/2023, 3:32 PM

## 2023-06-29 NOTE — Progress Notes (Signed)
 Physical Therapy Treatment Patient Details Name: Scott Schneider MRN: [id number] DOB: February 16, 1941 Today's Date: 06/29/2023   History of Present Illness Pt s/p L THR and with hx of TIA, spinal stenosis, CAD, and NSTEMI    PT Comments  Pt continues very cooperative and progressing with mobility but limited this am by increased pain with movement despite premed - muscle relaxer requested.    If plan is discharge home, recommend the following: A lot of help with walking and/or transfers;A little help with bathing/dressing/bathroom;Assistance with cooking/housework;Assist for transportation;Help with stairs or ramp for entrance   Can travel by private vehicle        Equipment Recommendations  Rolling walker (2 wheels)    Recommendations for Other Services       Precautions / Restrictions Precautions Precautions: Fall Restrictions Weight Bearing Restrictions Per Provider Order: No LLE Weight Bearing Per Provider Order: Weight bearing as tolerated     Mobility  Bed Mobility Overal bed mobility: Needs Assistance Bed Mobility: Supine to Sit     Supine to sit: Min assist     General bed mobility comments: increased time with cues for sequence and use of R LE to self assist    Transfers Overall transfer level: Needs assistance Equipment used: Rolling walker (2 wheels) Transfers: Sit to/from Stand Sit to Stand: From elevated surface, Min assist           General transfer comment: cues for LE management and use of UEs to self assist    Ambulation/Gait Ambulation/Gait assistance: Min assist Gait Distance (Feet): 38 Feet Assistive device: Rolling walker (2 wheels) Gait Pattern/deviations: Step-to pattern, Decreased step length - right, Decreased step length - left, Shuffle, Trunk flexed Gait velocity: decr     General Gait Details: cues for sequence, posture and position from RW; distance ltd by pain   Stairs             Wheelchair Mobility     Tilt Bed     Modified Rankin (Stroke Patients Only)       Balance Overall balance assessment: Needs assistance Sitting-balance support: No upper extremity supported, Feet supported Sitting balance-Leahy Scale: Good     Standing balance support: Bilateral upper extremity supported Standing balance-Leahy Scale: Poor                              Communication Communication Communication: No apparent difficulties  Cognition Arousal: Alert Behavior During Therapy: WFL for tasks assessed/performed   PT - Cognitive impairments: No apparent impairments                         Following commands: Intact      Cueing    Exercises      General Comments        Pertinent Vitals/Pain Pain Assessment Pain Assessment: 0-10 Pain Score: 8  Pain Location: L hip Pain Descriptors / Indicators: Aching, Grimacing, Guarding, Sore Pain Intervention(s): Monitored during session, Limited activity within patient's tolerance, Premedicated before session, Ice applied (muscle relaxer requested)    Home Living                          Prior Function            PT Goals (current goals can now be found in the care plan section) Acute Rehab PT Goals Patient Stated Goal: Regain IND PT Goal Formulation:  With patient Time For Goal Achievement: 07/05/23 Potential to Achieve Goals: Good Progress towards PT goals: Progressing toward goals    Frequency    7X/week      PT Plan      Co-evaluation              AM-PAC PT "6 Clicks" Mobility   Outcome Measure  Help needed turning from your back to your side while in a flat bed without using bedrails?: A Little Help needed moving from lying on your back to sitting on the side of a flat bed without using bedrails?: A Little Help needed moving to and from a bed to a chair (including a wheelchair)?: A Little Help needed standing up from a chair using your arms (e.g., wheelchair or bedside chair)?: A Little Help  needed to walk in hospital room?: A Little Help needed climbing 3-5 steps with a railing? : A Lot 6 Click Score: 17    End of Session Equipment Utilized During Treatment: Gait belt Activity Tolerance: Patient tolerated treatment well;Patient limited by pain Patient left: in chair;with call bell/phone within reach;with chair alarm set Nurse Communication: Mobility status PT Visit Diagnosis: Difficulty in walking, not elsewhere classified (R26.2)     Time: 1610-9604 PT Time Calculation (min) (ACUTE ONLY): 18 min  Charges:    $Gait Training: 8-22 mins PT General Charges $$ ACUTE PT VISIT: 1 Visit                     [name] PT Acute Rehabilitation Services Pager [phone] Office [phone]    Jeilyn Reznik 06/29/2023, 12:54 PM

## 2023-06-29 NOTE — Plan of Care (Signed)
 Problem: Clinical Measurements: Goal: Ability to maintain clinical measurements within normal limits will improve Outcome: Progressing   Problem: Activity: Goal: Risk for activity intolerance will decrease Outcome: Progressing   Problem: Elimination: Goal: Will not experience complications related to bowel motility Outcome: Progressing   Problem: Pain Managment: Goal: General experience of comfort will improve and/or be controlled Outcome: Progressing   Problem: Safety: Goal: Ability to remain free from injury will improve Outcome: Progressing   Ara Knee, RN 06/29/23 8:43 AM

## 2023-06-29 NOTE — Evaluation (Signed)
 Occupational Therapy Evaluation Patient Details Name: Scott Schneider MRN: [id number] DOB: May 02, 1940 Today's Date: 06/29/2023   History of Present Illness   Pt s/p L THR (anterior approach) and with hx of TIA, spinal stenosis, CAD, and NSTEMI     Clinical Impressions The pt is currently presenting with the below listed deficits (see OT problem list). He is subsequently presenting below his baseline level of functioning for ADL management. During the session, he required min assist to stand from chair using a RW, max assist for lower body dressing (limited by hip pain and feelings of stiffness), CGA to ambulate in his room using a RW, and min assist for sit to supine. Further education on ADLs, including potential use of AE for lower body ADLs is recommended. OT will continue to follow to maximize his independence with self care tasks and to facilitate his safe return home.     If plan is discharge home, recommend the following:   A little help with bathing/dressing/bathroom;A little help with walking and/or transfers;Assistance with cooking/housework;Assist for transportation     Functional Status Assessment   Patient has had a recent decline in their functional status and demonstrates the ability to make significant improvements in function in a reasonable and predictable amount of time.     Equipment Recommendations   Other (comment) (Rolling walker, lower body adaptive equipment)     Recommendations for Other Services         Precautions/Restrictions   Precautions Precautions: Fall Restrictions Weight Bearing Restrictions Per Provider Order: No LLE Weight Bearing Per Provider Order: Weight bearing as tolerated     Mobility Bed Mobility Overal bed mobility: Needs Assistance Bed Mobility: Sit to Supine       Sit to supine: Min assist        Transfers Overall transfer level: Needs assistance Equipment used: Rolling walker (2 wheels) Transfers: Sit  to/from Stand Sit to Stand: Min assist                  Balance       Sitting balance - Comments: static sitting-good. dynamic sitting-fair+       Standing balance comment: CGA with RW             ADL either performed or assessed with clinical judgement   ADL Overall ADL's : Needs assistance/impaired Eating/Feeding: Independent;Sitting   Grooming: Set up;Sitting           Upper Body Dressing : Set up;Sitting   Lower Body Dressing: Maximal assistance Lower Body Dressing Details (indicate cue type and reason): for sock management seated in chair                     Vision   Additional Comments: He correctly read the time depicted on the wall clock.            Pertinent Vitals/Pain Pain Assessment Pain Assessment: 0-10 Pain Score: 6  Pain Location: L hip Pain Intervention(s): Limited activity within patient's tolerance, Monitored during session, Repositioned     Extremity/Trunk Assessment Upper Extremity Assessment Upper Extremity Assessment: Overall WFL for tasks assessed;Right hand dominant   Lower Extremity Assessment Lower Extremity Assessment: LLE deficits/detail LLE Deficits / Details: decreased hip AROM due to pain and feelings of stiffness       Communication Communication Communication: No apparent difficulties   Cognition Arousal: Alert Behavior During Therapy: [facility] for tasks assessed/performed  OT - Cognition Comments: Oriented x4              Following commands: Intact                  Home Living Family/patient expects to be discharged to:: Private residence Living Arrangements: Alone Available Help at Discharge: Family Type of Home: House Home Access: Stairs to enter Secretary/administrator of Steps: 6 Entrance Stairs-Rails: Right;Left Home Layout: One level     Bathroom Shower/Tub: Walk-in shower;Tub/shower unit         Home Equipment: Rollator (4 wheels);Cane - single  point;Grab bars - tub/shower;Shower seat - built in          Prior Functioning/Environment Prior Level of Function : Independent/Modified Independent             Mobility Comments: Mostly used a cane for ambulation. ADLs Comments: Modified independent to independent for ADLs, hired assist for cleaning, and mostly did microwave meals.    OT Problem List: Decreased strength;Decreased range of motion;Impaired balance (sitting and/or standing);Decreased knowledge of use of DME or AE;Pain   OT Treatment/Interventions: Self-care/ADL training;Therapeutic exercise;Therapeutic activities;Energy conservation;Patient/family education;Balance training;DME and/or AE instruction      OT Goals(Current goals can be found in the care plan section)   Acute Rehab OT Goals Patient Stated Goal: to achieve functional independence OT Goal Formulation: With patient Time For Goal Achievement: 07/13/23 Potential to Achieve Goals: Good ADL Goals Pt Will Perform Lower Body Dressing: with supervision;with adaptive equipment;sitting/lateral leans;sit to/from stand Pt Will Transfer to Toilet: with supervision;ambulating Pt Will Perform Toileting - Clothing Manipulation and hygiene: with supervision;sit to/from stand   OT Frequency:  Min 3X/week       AM-PAC OT "6 Clicks" Daily Activity     Outcome Measure Help from another person eating meals?: None Help from another person taking care of personal grooming?: A Little Help from another person toileting, which includes using toliet, bedpan, or urinal?: A Little Help from another person bathing (including washing, rinsing, drying)?: A Lot Help from another person to put on and taking off regular upper body clothing?: A Little Help from another person to put on and taking off regular lower body clothing?: A Lot 6 Click Score: 17   End of Session Equipment Utilized During Treatment: Rolling walker (2 wheels) Nurse Communication: Other  (comment)  Activity Tolerance: Other (comment) (Fair tolerance) Patient left: in bed;with call bell/phone within reach;with bed alarm set  OT Visit Diagnosis: Muscle weakness (generalized) (M62.81);Unsteadiness on feet (R26.81);Other abnormalities of gait and mobility (R26.89);Pain Pain - Right/Left: Left Pain - part of body: Hip                Time: 1610-9604 OT Time Calculation (min): 21 min Charges:  OT General Charges $OT Visit: 1 Visit OT Evaluation $OT Eval Moderate Complexity: 1 Mod    Massimo Hartland L Mickey Esguerra, OTR/L 06/29/2023, 5:35 PM

## 2023-06-30 ENCOUNTER — Encounter (HOSPITAL_COMMUNITY): Payer: Self-pay | Admitting: Orthopaedic Surgery

## 2023-06-30 MED ORDER — OXYCODONE HCL 5 MG PO TABS: 5.0000 mg | ORAL_TABLET | Freq: Four times a day (QID) | ORAL | 0 refills | Status: DC | PRN

## 2023-06-30 NOTE — Discharge Summary (Signed)
 Patient ID: Scott Schneider MRN: [id number] DOB/AGE: 1940-12-26 83 y.o.  Admit date: 06/27/2023 Discharge date: 06/30/2023  Admission Diagnoses:  Principal Problem:   Unilateral primary osteoarthritis, left hip Active Problems:   Status post total replacement of left hip   Discharge Diagnoses:  Same  Past Medical History:  Diagnosis Date   Arthritis    CAD (coronary artery disease) 05/10/2020   Depression    H/O back injury 1980's   Hyperlipidemia 05/10/2020   Low back pain    NSTEMI (non-ST elevated myocardial infarction) (HCC) 04/24/2020   Other spondylosis, lumbar region    Paresthesia of skin    Prostate cancer (HCC)    Radiculopathy    Spinal stenosis of lumbar region    Syncope and collapse 04/26/2020   TIA (transient ischemic attack)    seen on imaging per pt    Surgeries: Procedure(s): ARTHROPLASTY, HIP, TOTAL, ANTERIOR APPROACH on 06/27/2023   Consultants:   Discharged Condition: Improved  Hospital Course: Scott Schneider is an 83 y.o. male who was admitted 06/27/2023 for operative treatment ofUnilateral primary osteoarthritis, left hip. Patient has severe unremitting pain that affects sleep, daily activities, and work/hobbies. After pre-op clearance the patient was taken to the operating room on 06/27/2023 and underwent  Procedure(s): ARTHROPLASTY, HIP, TOTAL, ANTERIOR APPROACH.    Patient was given perioperative antibiotics:  Anti-infectives (From admission, onward)    Start     Dose/Rate Route Frequency Ordered Stop   06/27/23 1700  ceFAZolin  (ANCEF ) IVPB 2g/100 mL premix        2 g 200 mL/hr over 30 Minutes Intravenous Every 6 hours 06/27/23 1405 06/28/23 0701   06/27/23 0730  ceFAZolin  (ANCEF ) IVPB 2g/100 mL premix        2 g 200 mL/hr over 30 Minutes Intravenous On call to O.R. 06/27/23 0723 06/27/23 1037        Patient was given sequential compression devices, early ambulation, and chemoprophylaxis to prevent DVT.  Patient benefited maximally from  hospital stay and there were no complications.    Recent vital signs: Patient Vitals for the past 24 hrs:  BP Temp Temp src Pulse Resp SpO2  06/30/23 0616 (!) 123/59 99.6 F (37.6 C) Oral 86 15 98 %  06/29/23 2142 (!) 131/54 99.7 F (37.6 C) Oral 98 16 97 %  06/29/23 1414 (!) 122/52 98.8 F (37.1 C) -- 99 (!) 22 97 %     Recent laboratory studies:  Recent Labs    06/28/23 0333  WBC 7.1  HGB 11.0*  HCT 34.1*  PLT 218  NA 132*  K 3.6  CL 102  CO2 24  BUN 9  CREATININE 0.79  GLUCOSE 164*  CALCIUM  8.1*     Discharge Medications:   Allergies as of 06/30/2023   No Known Allergies      Medication List     TAKE these medications    acetaminophen  325 MG tablet Commonly known as: TYLENOL  Take 650 mg by mouth every 6 (six) hours as needed for mild pain.   Eliquis  5 MG Tabs tablet Generic drug: apixaban  Take 5 mg by mouth 2 (two) times daily.   latanoprost  0.005 % ophthalmic solution Commonly known as: XALATAN  Place 1 drop into both eyes at bedtime.   oxyCODONE  5 MG immediate release tablet Commonly known as: Oxy IR/ROXICODONE  Take 1-2 tablets (5-10 mg total) by mouth every 6 (six) hours as needed for moderate pain (pain score 4-6) (pain score 4-6).   tamsulosin  0.4 MG Caps  capsule Commonly known as: FLOMAX  Take 0.4 mg by mouth at bedtime.   VITAMIN D PO Take 1 tablet by mouth daily.               Durable Medical Equipment  (From admission, onward)           Start     Ordered   06/27/23 1406  DME 3 n 1  Once        06/27/23 1405   06/27/23 1406  DME Walker rolling  Once       Question Answer Comment  Walker: With 5 Inch Wheels   Patient needs a walker to treat with the following condition Status post total replacement of left hip      06/27/23 1405            Diagnostic Studies: DG Pelvis Portable Result Date: 06/27/2023 CLINICAL DATA:  Status post total left hip arthroplasty. EXAM: PORTABLE PELVIS 1-2 VIEWS COMPARISON:  Bilateral  hip radiographs 04/16/2023 FINDINGS: Interval total left hip arthroplasty. Lucency is seen to indicate hardware failure or loosening. Expected postoperative change including left femoroacetabular intra-articular air and lateral left hip subcutaneous air. There are lateral left hip surgical skin staples. Two surgical clips again overlie the inferior midline pelvis. Redemonstration of severe superior right femoroacetabular joint space narrowing, subchondral sclerosis/cystic change, and peripheral osteophytosis. IMPRESSION: 1. Interval total left hip arthroplasty without evidence of hardware failure. 2. Severe right femoroacetabular osteoarthritis. Electronically Signed   By: Bertina Broccoli M.D.   On: 06/27/2023 13:36   DG HIP UNILAT WITH PELVIS 1V LEFT Result Date: 06/27/2023 CLINICAL DATA:  Total left hip arthroplasty. Intraoperative fluoroscopy. EXAM: DG HIP (WITH OR WITHOUT PELVIS) 1V*L* COMPARISON:  Bilateral hip radiographs 04/16/2023 FINDINGS: Images were performed intraoperatively without the presence of a radiologist. Interval total left hip arthroplasty. No hardware complication is seen. Total fluoroscopy images: 3 Total fluoroscopy time: 25 seconds Total dose: Radiation Exposure Index (as provided by the fluoroscopic device): 2.7 mGy air Kerma Please see intraoperative findings for further detail. IMPRESSION: Intraoperative fluoroscopy for total left hip arthroplasty. Electronically Signed   By: Bertina Broccoli M.D.   On: 06/27/2023 13:35   DG C-Arm 1-60 Min-No Report Result Date: 06/27/2023 Fluoroscopy was utilized by the requesting physician.  No radiographic interpretation.   DG C-Arm 1-60 Min-No Report Result Date: 06/27/2023 Fluoroscopy was utilized by the requesting physician.  No radiographic interpretation.    Disposition: Discharge disposition: 01-Home or Self Care          Follow-up Information     [name], MD Follow up in 2 week(s).   Specialty: Orthopedic  Surgery Contact information: [address] [place] [address] [phone]         [facility] Follow up.   Why: Leilani Punter information: [address] [place] [address] [phone]                  Signed: [name] 06/30/2023, 1:37 PM

## 2023-06-30 NOTE — Progress Notes (Signed)
 Physical Therapy Treatment Patient Details Name: Scott Schneider MRN: [id number] DOB: [birth year] Today's Date: 06/30/2023   History of Present Illness Pt s/p L THR and with hx of TIA, spinal stenosis, CAD, and NSTEMI    PT Comments  POD # 3 am session AxO x 3 pleasant and motivated.  Lives home alone but has a Son who can help "some".  Pt did hire a housekeeper and arranged Lawn care.  Advised Pt to "always" carry his cell phone with hime every room "just in case". Pt was sitting EOB Indep eating breakfast on arrival.  Assisted with amb in hallway, practiced 2 steps he has to enter his home "in the back" and Then returned to room to perform some TE's following HEP handout.  Instructed on proper tech, freq as well as use of ICE.   Extended Tx session needed for HEP Instructions.   Addressed all mobility questions, discussed appropriate activity, educated on use of ICE.  Pt ready for D/C to home. Son was unable to secure a walker as prior stated.  Secure chat sent to [facility].  Pt plans to D/C around Noon today.  RN notified.    If plan is discharge home, recommend the following: A lot of help with walking and/or transfers;A little help with bathing/dressing/bathroom;Assistance with cooking/housework;Assist for transportation;Help with stairs or ramp for entrance   Can travel by private vehicle        Equipment Recommendations  Rolling walker (2 wheels)    Recommendations for Other Services       Precautions / Restrictions Precautions Precautions: Fall Restrictions Weight Bearing Restrictions Per Provider Order: No LLE Weight Bearing Per Provider Order: Weight bearing as tolerated     Mobility  Bed Mobility               General bed mobility comments: sitting EOB Indep on arrival eating breakfast    Transfers Overall transfer level: Needs assistance Equipment used: Rolling walker (2 wheels) Transfers: Sit to/from Stand Sit to Stand: Supervision, Contact guard assist            General transfer comment: increased time but good use of hands to steady self.    Ambulation/Gait Ambulation/Gait assistance: Supervision, Contact guard assist Gait Distance (Feet): 75 Feet Assistive device: Rolling walker (2 wheels) Gait Pattern/deviations: Step-to pattern, Decreased step length - right, Decreased step length - left, Shuffle, Trunk flexed Gait velocity: decreased     General Gait Details: cues for sequence, posture and position from RW;   Stairs Stairs: Yes Stairs assistance: Min assist Stair Management: No rails, Step to pattern, Forwards, With walker Number of Stairs: 2 General stair comments: CVC's on proper walker placement as well as sequencing.  Educated "never do stairs by yourself".  "Some needs to secure walker" as Pt has NO rails.   Wheelchair Mobility     Tilt Bed    Modified Rankin (Stroke Patients Only)       Balance                                            Communication Communication Communication: No apparent difficulties  Cognition Arousal: Alert Behavior During Therapy: WFL for tasks assessed/performed   PT - Cognitive impairments: No apparent impairments                       PT -  Cognition Comments: AxO x 3 lives alone and plans to have Son help. Following commands: Intact      Cueing Cueing Techniques: Verbal cues  Exercises  Total Hip Replacement TE's following HEP Handout 10 reps ankle pumps 05 reps knee presses 05 reps heel slides 05 reps SAQ's 05 reps ABD Instructed how to use a belt loop to assist  Followed by ICE     General Comments        Pertinent Vitals/Pain Pain Assessment Pain Assessment: 0-10 Pain Score: 8  Pain Location: L hip with TE's Pain Descriptors / Indicators: Aching, Grimacing, Guarding, Sore, Operative site guarding Pain Intervention(s): Monitored during session, Premedicated before session, Repositioned, Ice applied    Home Living                           Prior Function            PT Goals (current goals can now be found in the care plan section) Progress towards PT goals: Progressing toward goals    Frequency    7X/week      PT Plan      Co-evaluation              AM-PAC PT "6 Clicks" Mobility   Outcome Measure  Help needed turning from your back to your side while in a flat bed without using bedrails?: None Help needed moving from lying on your back to sitting on the side of a flat bed without using bedrails?: None Help needed moving to and from a bed to a chair (including a wheelchair)?: None Help needed standing up from a chair using your arms (e.g., wheelchair or bedside chair)?: None Help needed to walk in hospital room?: None Help needed climbing 3-5 steps with a railing? : A Little 6 Click Score: 23    End of Session Equipment Utilized During Treatment: Gait belt Activity Tolerance: Patient tolerated treatment well;Patient limited by pain Patient left: in chair;with call bell/phone within reach;with chair alarm set Nurse Communication: Mobility status PT Visit Diagnosis: Difficulty in walking, not elsewhere classified (R26.2)     Time: 0930-1010 PT Time Calculation (min) (ACUTE ONLY): 40 min  Charges:    $Gait Training: 8-22 mins $Therapeutic Exercise: 8-22 mins $Therapeutic Activity: 8-22 mins PT General Charges $$ ACUTE PT VISIT: 1 Visit          Bess Broody  PTA Acute  Rehabilitation Services Office M-F          [phone]

## 2023-06-30 NOTE — TOC Transition Note (Addendum)
 Transition of Care [facility]) - Discharge Note   Patient Details  Name: Scott Schneider MRN: [id number] Date of Birth: Feb 10, 1941  Transition of [facility]) CM/SW Contact:  Bari Leys, RN Phone Number: 06/30/2023, 9:38 AM   Clinical Narrative:   Met with pt at bedside  to review dc therapy and home DME needs, pt confirmed HH PT with [facility] (wasn't sure name of [facility] agency but confirmed Ortho office was arranging Hh PT), reports he has a RW, no home equipment needs. No TOC needs.   --9:57am per PT, pt/son reports he needs a RW. [employer], rep-Mitch, to deliver RW to bedside, added to AVS.     Final next level of care: Home w Home Health Services Barriers to Discharge: No Barriers Identified   Patient Goals and CMS Choice Patient states their goals for this hospitalization and ongoing recovery are:: return home          Discharge Placement                       Discharge Plan and Services Additional resources added to the After Visit Summary for                                       Social Drivers of Health (SDOH) Interventions SDOH Screenings   Food Insecurity: No Food Insecurity (06/27/2023)  Housing: Low Risk  (06/27/2023)  Transportation Needs: No Transportation Needs (06/27/2023)  Utilities: Not At Risk (06/27/2023)  Social Connections: Moderately Isolated (06/27/2023)  Tobacco Use: Low Risk  (06/27/2023)     Readmission Risk Interventions    06/30/2023    9:37 AM  Readmission Risk Prevention Plan  Post Dischage Appt Complete  Medication Screening Complete  Transportation Screening Complete

## 2023-06-30 NOTE — Progress Notes (Signed)
 Patient ID: Scott Schneider, male   DOB: 01/05/1941, 83 y.o.   MRN: [id number] The patient's vital signs are stable and his left hip is stable.  He is awake and alert this morning.  I did change the left hip dressing just to make sure his incision was clean and dry and it is.  I did review the notes from PT and OT and he is making progress.  Will put in for discharge this afternoon if he continues to make progress.  The patient agrees with this as well.

## 2023-06-30 NOTE — Progress Notes (Signed)
 Occupational Therapy Treatment Patient Details Name: Scott Schneider MRN: [id number] DOB: September 22, 1940 Today's Date: 06/30/2023   History of present illness Pt s/p L THR and with hx of TIA, spinal stenosis, CAD, and NSTEMI   OT comments  Patient seen for skilled OT this session. Improving with less pain and increased functional mobility. New RW delivered and OT set for height and training in use for functional ambulation to use bathroom. Would benefit from 3 in 1 commode if able to acquire prior to discharge as home toilet low without any hand rail or grab bar. Demonstrated AE for LB self care and patient progressing. If patient does not discharge today, will continue to progress within hospital environment, otherwise doing well for home once MD clears.       If plan is discharge home, recommend the following:  A little help with bathing/dressing/bathroom;A little help with walking and/or transfers;Assistance with cooking/housework;Assist for transportation   Equipment Recommendations  BSC/3in1       Precautions / Restrictions Precautions Precautions: Fall Restrictions Weight Bearing Restrictions Per Provider Order: No LLE Weight Bearing Per Provider Order: Weight bearing as tolerated       Mobility Bed Mobility Overal bed mobility:  (patient in recliner)                  Transfers Overall transfer level: Needs assistance Equipment used: Rolling walker (2 wheels) Transfers: Sit to/from Stand Sit to Stand: Supervision, Contact guard assist     Step pivot transfers: Supervision     General transfer comment: ambulated from recliner to and from commode set over toilet with Supervision, set up new delivered RW for height     Balance Overall balance assessment: Needs assistance Sitting-balance support: No upper extremity supported, Feet supported Sitting balance-Leahy Scale: Good Sitting balance - Comments: static sitting-good. dynamic sitting-fair+   Standing balance  support: Bilateral upper extremity supported Standing balance-Leahy Scale: Fair Standing balance comment: close s this visit with RW                           ADL either performed or assessed with clinical judgement   ADL Overall ADL's : Needs assistance/impaired Eating/Feeding: Independent;Sitting   Grooming: Wash/dry hands;Wash/dry face;Oral care;Independent;Sitting   Upper Body Bathing: Independent;Sitting   Lower Body Bathing: Set up;Sit to/from stand;Sitting/lateral leans (LH sponge- pt has at home)   Upper Body Dressing : Independent;Sitting   Lower Body Dressing: Minimal assistance;Sitting/lateral leans;Sit to/from stand (demonstrated with LH AE)   Toilet Transfer: Supervision/safety;BSC/3in1;Rolling walker (2 wheels) (placed commode over toilet and did very well)   Toileting- Clothing Manipulation and Hygiene: Set up   Tub/ Shower Transfer:  (patient preferring to sponge bathe initially at home until mobility improves but does have a shower seat)     General ADL Comments: OT educated on use of reacher for LB garment management, demonstrated sock aise and patient declined need due to usually wears slip on shoes without sicks, confirmed he does have a LH sponge and suction reacher at home. TOC notified of need for 3 in 1 commode due to low toilet with no grab bar in home as per OT rec and demonstration and training this session    Extremity/Trunk Assessment Upper Extremity Assessment Upper Extremity Assessment: Overall WFL for tasks assessed   Lower Extremity Assessment Lower Extremity Assessment: Defer to PT evaluation        Vision   Vision Assessment?: No apparent visual deficits  Communication Communication Communication: No apparent difficulties   Cognition Arousal: Alert Behavior During Therapy: WFL for tasks assessed/performed                                 Following commands: Intact        Cueing   Cueing  Techniques: Verbal cues  Exercises      Shoulder Instructions       General Comments ice to L hip and post op dressing intact    Pertinent Vitals/ Pain       Pain Assessment Pain Assessment: 0-10 Pain Score: 7  Pain Location: L hip Pain Descriptors / Indicators: Aching, Sore Pain Intervention(s): Premedicated before session, Monitored during session, Ice applied   Frequency  Min 3X/week        Progress Toward Goals  OT Goals(current goals can now be found in the care plan section)  Progress towards OT goals: Progressing toward goals  Acute Rehab OT Goals Patient Stated Goal: to continue to get more mobile OT Goal Formulation: With patient Time For Goal Achievement: 07/13/23 Potential to Achieve Goals: Good ADL Goals Pt Will Perform Lower Body Dressing: with supervision;with adaptive equipment;sitting/lateral leans;sit to/from stand Pt Will Transfer to Toilet: with supervision;ambulating Pt Will Perform Toileting - Clothing Manipulation and hygiene: with supervision;sit to/from stand   AM-PAC OT "6 Clicks" Daily Activity     Outcome Measure   Help from another person eating meals?: None Help from another person taking care of personal grooming?: None Help from another person toileting, which includes using toliet, bedpan, or urinal?: A Little Help from another person bathing (including washing, rinsing, drying)?: A Little Help from another person to put on and taking off regular upper body clothing?: None Help from another person to put on and taking off regular lower body clothing?: A Little 6 Click Score: 21    End of Session Equipment Utilized During Treatment: Gait belt;Rolling walker (2 wheels)  OT Visit Diagnosis: Muscle weakness (generalized) (M62.81);Unsteadiness on feet (R26.81);Other abnormalities of gait and mobility (R26.89);Pain Pain - Right/Left: Left Pain - part of body: Hip   Activity Tolerance Patient tolerated treatment well   Patient Left  in chair;with call bell/phone within reach;with chair alarm set   Nurse Communication Mobility status;Other (comment) (Flowsheet data for voiding)        Time: 9147-8295 OT Time Calculation (min): 30 min  Charges: OT General Charges $OT Visit: 1 Visit OT Treatments $Self Care/Home Management : 23-37 mins  Trenity Pha OT/L Acute Rehabilitation Department  [phone]  06/30/2023, 11:21 AM

## 2023-07-01 ENCOUNTER — Telehealth: Payer: Self-pay | Admitting: Orthopaedic Surgery

## 2023-07-01 DIAGNOSIS — M48061 Spinal stenosis, lumbar region without neurogenic claudication: Secondary | ICD-10-CM | POA: Diagnosis not present

## 2023-07-01 DIAGNOSIS — Z86718 Personal history of other venous thrombosis and embolism: Secondary | ICD-10-CM | POA: Diagnosis not present

## 2023-07-01 DIAGNOSIS — I2510 Atherosclerotic heart disease of native coronary artery without angina pectoris: Secondary | ICD-10-CM | POA: Diagnosis not present

## 2023-07-01 DIAGNOSIS — Z96642 Presence of left artificial hip joint: Secondary | ICD-10-CM | POA: Diagnosis not present

## 2023-07-01 DIAGNOSIS — E785 Hyperlipidemia, unspecified: Secondary | ICD-10-CM | POA: Diagnosis not present

## 2023-07-01 DIAGNOSIS — Z86711 Personal history of pulmonary embolism: Secondary | ICD-10-CM | POA: Diagnosis not present

## 2023-07-01 DIAGNOSIS — Z7901 Long term (current) use of anticoagulants: Secondary | ICD-10-CM | POA: Diagnosis not present

## 2023-07-01 DIAGNOSIS — D649 Anemia, unspecified: Secondary | ICD-10-CM | POA: Diagnosis not present

## 2023-07-01 DIAGNOSIS — I252 Old myocardial infarction: Secondary | ICD-10-CM | POA: Diagnosis not present

## 2023-07-01 DIAGNOSIS — Z471 Aftercare following joint replacement surgery: Secondary | ICD-10-CM | POA: Diagnosis not present

## 2023-07-01 DIAGNOSIS — M1611 Unilateral primary osteoarthritis, right hip: Secondary | ICD-10-CM | POA: Diagnosis not present

## 2023-07-01 DIAGNOSIS — M4726 Other spondylosis with radiculopathy, lumbar region: Secondary | ICD-10-CM | POA: Diagnosis not present

## 2023-07-01 NOTE — Telephone Encounter (Signed)
Verbal left on VM 

## 2023-07-01 NOTE — Telephone Encounter (Signed)
 Polly Brink from [facility] called. He would like home PT orders 2x wk for 2wks and 1x wk for 6wks. Cb# [phone]

## 2023-07-03 DIAGNOSIS — Z7901 Long term (current) use of anticoagulants: Secondary | ICD-10-CM | POA: Diagnosis not present

## 2023-07-03 DIAGNOSIS — I252 Old myocardial infarction: Secondary | ICD-10-CM | POA: Diagnosis not present

## 2023-07-03 DIAGNOSIS — M1611 Unilateral primary osteoarthritis, right hip: Secondary | ICD-10-CM | POA: Diagnosis not present

## 2023-07-03 DIAGNOSIS — M48061 Spinal stenosis, lumbar region without neurogenic claudication: Secondary | ICD-10-CM | POA: Diagnosis not present

## 2023-07-03 DIAGNOSIS — Z471 Aftercare following joint replacement surgery: Secondary | ICD-10-CM | POA: Diagnosis not present

## 2023-07-03 DIAGNOSIS — Z86718 Personal history of other venous thrombosis and embolism: Secondary | ICD-10-CM | POA: Diagnosis not present

## 2023-07-03 DIAGNOSIS — Z96642 Presence of left artificial hip joint: Secondary | ICD-10-CM | POA: Diagnosis not present

## 2023-07-03 DIAGNOSIS — D649 Anemia, unspecified: Secondary | ICD-10-CM | POA: Diagnosis not present

## 2023-07-03 DIAGNOSIS — I2510 Atherosclerotic heart disease of native coronary artery without angina pectoris: Secondary | ICD-10-CM | POA: Diagnosis not present

## 2023-07-03 DIAGNOSIS — E785 Hyperlipidemia, unspecified: Secondary | ICD-10-CM | POA: Diagnosis not present

## 2023-07-03 DIAGNOSIS — Z86711 Personal history of pulmonary embolism: Secondary | ICD-10-CM | POA: Diagnosis not present

## 2023-07-03 DIAGNOSIS — M4726 Other spondylosis with radiculopathy, lumbar region: Secondary | ICD-10-CM | POA: Diagnosis not present

## 2023-07-07 DIAGNOSIS — M4726 Other spondylosis with radiculopathy, lumbar region: Secondary | ICD-10-CM | POA: Diagnosis not present

## 2023-07-07 DIAGNOSIS — D649 Anemia, unspecified: Secondary | ICD-10-CM | POA: Diagnosis not present

## 2023-07-07 DIAGNOSIS — E785 Hyperlipidemia, unspecified: Secondary | ICD-10-CM | POA: Diagnosis not present

## 2023-07-07 DIAGNOSIS — I2510 Atherosclerotic heart disease of native coronary artery without angina pectoris: Secondary | ICD-10-CM | POA: Diagnosis not present

## 2023-07-07 DIAGNOSIS — M48061 Spinal stenosis, lumbar region without neurogenic claudication: Secondary | ICD-10-CM | POA: Diagnosis not present

## 2023-07-07 DIAGNOSIS — Z86711 Personal history of pulmonary embolism: Secondary | ICD-10-CM | POA: Diagnosis not present

## 2023-07-07 DIAGNOSIS — Z96642 Presence of left artificial hip joint: Secondary | ICD-10-CM | POA: Diagnosis not present

## 2023-07-07 DIAGNOSIS — Z86718 Personal history of other venous thrombosis and embolism: Secondary | ICD-10-CM | POA: Diagnosis not present

## 2023-07-07 DIAGNOSIS — M1611 Unilateral primary osteoarthritis, right hip: Secondary | ICD-10-CM | POA: Diagnosis not present

## 2023-07-07 DIAGNOSIS — Z7901 Long term (current) use of anticoagulants: Secondary | ICD-10-CM | POA: Diagnosis not present

## 2023-07-07 DIAGNOSIS — Z471 Aftercare following joint replacement surgery: Secondary | ICD-10-CM | POA: Diagnosis not present

## 2023-07-07 DIAGNOSIS — I252 Old myocardial infarction: Secondary | ICD-10-CM | POA: Diagnosis not present

## 2023-07-09 DIAGNOSIS — E785 Hyperlipidemia, unspecified: Secondary | ICD-10-CM | POA: Diagnosis not present

## 2023-07-09 DIAGNOSIS — I2510 Atherosclerotic heart disease of native coronary artery without angina pectoris: Secondary | ICD-10-CM | POA: Diagnosis not present

## 2023-07-09 DIAGNOSIS — M4726 Other spondylosis with radiculopathy, lumbar region: Secondary | ICD-10-CM | POA: Diagnosis not present

## 2023-07-09 DIAGNOSIS — Z471 Aftercare following joint replacement surgery: Secondary | ICD-10-CM | POA: Diagnosis not present

## 2023-07-09 DIAGNOSIS — Z86718 Personal history of other venous thrombosis and embolism: Secondary | ICD-10-CM | POA: Diagnosis not present

## 2023-07-09 DIAGNOSIS — Z86711 Personal history of pulmonary embolism: Secondary | ICD-10-CM | POA: Diagnosis not present

## 2023-07-09 DIAGNOSIS — M48061 Spinal stenosis, lumbar region without neurogenic claudication: Secondary | ICD-10-CM | POA: Diagnosis not present

## 2023-07-09 DIAGNOSIS — Z7901 Long term (current) use of anticoagulants: Secondary | ICD-10-CM | POA: Diagnosis not present

## 2023-07-09 DIAGNOSIS — Z96642 Presence of left artificial hip joint: Secondary | ICD-10-CM | POA: Diagnosis not present

## 2023-07-09 DIAGNOSIS — D649 Anemia, unspecified: Secondary | ICD-10-CM | POA: Diagnosis not present

## 2023-07-09 DIAGNOSIS — I252 Old myocardial infarction: Secondary | ICD-10-CM | POA: Diagnosis not present

## 2023-07-09 DIAGNOSIS — M1611 Unilateral primary osteoarthritis, right hip: Secondary | ICD-10-CM | POA: Diagnosis not present

## 2023-07-10 ENCOUNTER — Ambulatory Visit: Admitting: Orthopaedic Surgery

## 2023-07-10 ENCOUNTER — Encounter: Payer: Self-pay | Admitting: Orthopaedic Surgery

## 2023-07-10 DIAGNOSIS — Z96642 Presence of left artificial hip joint: Secondary | ICD-10-CM

## 2023-07-10 MED ORDER — OXYCODONE HCL 5 MG PO TABS: 5.0000 mg | ORAL_TABLET | Freq: Three times a day (TID) | ORAL | 0 refills | Status: DC | PRN

## 2023-07-10 NOTE — Progress Notes (Signed)
 The patient is here today for his first postoperative visit status post a left total hip replacement to treat his significant left hip arthritis.  He is 83 years old and decently frail.  He comes in a wheelchair today but he has been getting around with a walker at home.  He has been dealing with constipation.  He is on Eliquis  is a chronic blood thinning medication.  He does need 1 refill pain medication but has been using this sparingly.  On exam his leg lengths appear equal with him laying down.  His left hip incision looks good.  Staples have been removed and Steri-Strips applied.  There is no significant seroma.  He will continue to increase his activities as comfort allows.  I recommended a combination of MiraLAX and Colace and even considering mag citrate if his constipation worsens.  Mobility and decreasing pain medications will also help.  From our standpoint we will see him back in a month to see how he is doing overall from mobility standpoint but no x-rays are needed.

## 2023-07-16 DIAGNOSIS — E785 Hyperlipidemia, unspecified: Secondary | ICD-10-CM | POA: Diagnosis not present

## 2023-07-16 DIAGNOSIS — M48061 Spinal stenosis, lumbar region without neurogenic claudication: Secondary | ICD-10-CM | POA: Diagnosis not present

## 2023-07-16 DIAGNOSIS — M1611 Unilateral primary osteoarthritis, right hip: Secondary | ICD-10-CM | POA: Diagnosis not present

## 2023-07-16 DIAGNOSIS — Z7901 Long term (current) use of anticoagulants: Secondary | ICD-10-CM | POA: Diagnosis not present

## 2023-07-16 DIAGNOSIS — Z96642 Presence of left artificial hip joint: Secondary | ICD-10-CM | POA: Diagnosis not present

## 2023-07-16 DIAGNOSIS — I2510 Atherosclerotic heart disease of native coronary artery without angina pectoris: Secondary | ICD-10-CM | POA: Diagnosis not present

## 2023-07-16 DIAGNOSIS — Z86711 Personal history of pulmonary embolism: Secondary | ICD-10-CM | POA: Diagnosis not present

## 2023-07-16 DIAGNOSIS — Z471 Aftercare following joint replacement surgery: Secondary | ICD-10-CM | POA: Diagnosis not present

## 2023-07-16 DIAGNOSIS — D649 Anemia, unspecified: Secondary | ICD-10-CM | POA: Diagnosis not present

## 2023-07-16 DIAGNOSIS — I252 Old myocardial infarction: Secondary | ICD-10-CM | POA: Diagnosis not present

## 2023-07-16 DIAGNOSIS — Z86718 Personal history of other venous thrombosis and embolism: Secondary | ICD-10-CM | POA: Diagnosis not present

## 2023-07-16 DIAGNOSIS — M4726 Other spondylosis with radiculopathy, lumbar region: Secondary | ICD-10-CM | POA: Diagnosis not present

## 2023-07-23 ENCOUNTER — Telehealth: Payer: Self-pay | Admitting: Orthopaedic Surgery

## 2023-07-23 DIAGNOSIS — E785 Hyperlipidemia, unspecified: Secondary | ICD-10-CM | POA: Diagnosis not present

## 2023-07-23 DIAGNOSIS — Z471 Aftercare following joint replacement surgery: Secondary | ICD-10-CM | POA: Diagnosis not present

## 2023-07-23 DIAGNOSIS — Z96642 Presence of left artificial hip joint: Secondary | ICD-10-CM | POA: Diagnosis not present

## 2023-07-23 DIAGNOSIS — I252 Old myocardial infarction: Secondary | ICD-10-CM | POA: Diagnosis not present

## 2023-07-23 DIAGNOSIS — Z86718 Personal history of other venous thrombosis and embolism: Secondary | ICD-10-CM | POA: Diagnosis not present

## 2023-07-23 DIAGNOSIS — I2510 Atherosclerotic heart disease of native coronary artery without angina pectoris: Secondary | ICD-10-CM | POA: Diagnosis not present

## 2023-07-23 DIAGNOSIS — M4726 Other spondylosis with radiculopathy, lumbar region: Secondary | ICD-10-CM | POA: Diagnosis not present

## 2023-07-23 DIAGNOSIS — Z86711 Personal history of pulmonary embolism: Secondary | ICD-10-CM | POA: Diagnosis not present

## 2023-07-23 DIAGNOSIS — M1611 Unilateral primary osteoarthritis, right hip: Secondary | ICD-10-CM | POA: Diagnosis not present

## 2023-07-23 DIAGNOSIS — Z7901 Long term (current) use of anticoagulants: Secondary | ICD-10-CM | POA: Diagnosis not present

## 2023-07-23 DIAGNOSIS — D649 Anemia, unspecified: Secondary | ICD-10-CM | POA: Diagnosis not present

## 2023-07-23 DIAGNOSIS — M48061 Spinal stenosis, lumbar region without neurogenic claudication: Secondary | ICD-10-CM | POA: Diagnosis not present

## 2023-07-23 NOTE — Telephone Encounter (Signed)
 [facility] PT called and left message regarding home therapy for patient. Requesting a call back to confirm whether patient will need PT next week or will begin outpatient therapy. CB#[phone].

## 2023-07-24 NOTE — Telephone Encounter (Signed)
PT aware of the below message from [facility]

## 2023-07-29 DIAGNOSIS — E785 Hyperlipidemia, unspecified: Secondary | ICD-10-CM | POA: Diagnosis not present

## 2023-07-29 DIAGNOSIS — Z96642 Presence of left artificial hip joint: Secondary | ICD-10-CM | POA: Diagnosis not present

## 2023-07-29 DIAGNOSIS — M48061 Spinal stenosis, lumbar region without neurogenic claudication: Secondary | ICD-10-CM | POA: Diagnosis not present

## 2023-07-29 DIAGNOSIS — I252 Old myocardial infarction: Secondary | ICD-10-CM | POA: Diagnosis not present

## 2023-07-29 DIAGNOSIS — M4726 Other spondylosis with radiculopathy, lumbar region: Secondary | ICD-10-CM | POA: Diagnosis not present

## 2023-07-29 DIAGNOSIS — Z471 Aftercare following joint replacement surgery: Secondary | ICD-10-CM | POA: Diagnosis not present

## 2023-07-29 DIAGNOSIS — Z7901 Long term (current) use of anticoagulants: Secondary | ICD-10-CM | POA: Diagnosis not present

## 2023-07-29 DIAGNOSIS — M1611 Unilateral primary osteoarthritis, right hip: Secondary | ICD-10-CM | POA: Diagnosis not present

## 2023-07-29 DIAGNOSIS — Z86718 Personal history of other venous thrombosis and embolism: Secondary | ICD-10-CM | POA: Diagnosis not present

## 2023-07-29 DIAGNOSIS — Z86711 Personal history of pulmonary embolism: Secondary | ICD-10-CM | POA: Diagnosis not present

## 2023-07-29 DIAGNOSIS — D649 Anemia, unspecified: Secondary | ICD-10-CM | POA: Diagnosis not present

## 2023-07-29 DIAGNOSIS — I2510 Atherosclerotic heart disease of native coronary artery without angina pectoris: Secondary | ICD-10-CM | POA: Diagnosis not present

## 2023-08-07 ENCOUNTER — Ambulatory Visit: Admitting: Orthopaedic Surgery

## 2023-08-07 ENCOUNTER — Encounter: Payer: Self-pay | Admitting: Orthopaedic Surgery

## 2023-08-07 DIAGNOSIS — Z96642 Presence of left artificial hip joint: Secondary | ICD-10-CM

## 2023-08-07 NOTE — Progress Notes (Signed)
 The patient is an 83 year old gentleman who is now 6-week status post a left total hip arthroplasty.  He is active and ambulates using a cane.  He says the hip is doing well and he reports increasing range of motion and strength since surgery.  He said he is definitely happier now than what he was before surgery with the significantly arthritic hip.  On exam the left operative hip moves smoothly and fluidly.  There are no complicating features thus far.  The incision is healed nicely.  From our standpoint we will see him back in 6 months unless there are issues.  Will have a standing AP pelvis and lateral of his left hip at that visit.

## 2023-08-13 ENCOUNTER — Ambulatory Visit (HOSPITAL_COMMUNITY)
Admission: RE | Admit: 2023-08-13 | Discharge: 2023-08-13 | Disposition: A | Discharge status: Home / Self Care | Source: Ambulatory Visit | Attending: Cardiology | Admitting: Cardiology

## 2023-08-13 DIAGNOSIS — Z86711 Personal history of pulmonary embolism: Secondary | ICD-10-CM | POA: Diagnosis present

## 2023-08-13 DIAGNOSIS — E785 Hyperlipidemia, unspecified: Secondary | ICD-10-CM | POA: Diagnosis not present

## 2023-08-13 DIAGNOSIS — I2510 Atherosclerotic heart disease of native coronary artery without angina pectoris: Secondary | ICD-10-CM | POA: Insufficient documentation

## 2023-08-13 DIAGNOSIS — I071 Rheumatic tricuspid insufficiency: Secondary | ICD-10-CM | POA: Insufficient documentation

## 2023-08-13 DIAGNOSIS — Z86718 Personal history of other venous thrombosis and embolism: Secondary | ICD-10-CM | POA: Diagnosis not present

## 2023-08-13 DIAGNOSIS — R55 Syncope and collapse: Secondary | ICD-10-CM | POA: Insufficient documentation

## 2023-08-13 DIAGNOSIS — I252 Old myocardial infarction: Secondary | ICD-10-CM | POA: Insufficient documentation

## 2023-08-13 DIAGNOSIS — I77810 Thoracic aortic ectasia: Secondary | ICD-10-CM

## 2023-08-13 LAB — ECHOCARDIOGRAM COMPLETE
Area-P 1/2: 3.66 cm2
S' Lateral: 2.9 cm

## 2023-08-18 DIAGNOSIS — R350 Frequency of micturition: Secondary | ICD-10-CM | POA: Diagnosis not present

## 2023-08-21 ENCOUNTER — Other Ambulatory Visit (HOSPITAL_COMMUNITY): Payer: Medicare HMO

## 2023-08-29 ENCOUNTER — Encounter: Payer: Self-pay | Admitting: Internal Medicine

## 2023-08-29 ENCOUNTER — Ambulatory Visit: Payer: Medicare HMO | Attending: Internal Medicine | Admitting: Internal Medicine

## 2023-08-29 VITALS — BP 106/58 | HR 68 | Ht 70.0 in | Wt 193.8 lb

## 2023-08-29 DIAGNOSIS — I872 Venous insufficiency (chronic) (peripheral): Secondary | ICD-10-CM

## 2023-08-29 DIAGNOSIS — Z86711 Personal history of pulmonary embolism: Secondary | ICD-10-CM | POA: Diagnosis not present

## 2023-08-29 DIAGNOSIS — I77819 Aortic ectasia, unspecified site: Secondary | ICD-10-CM | POA: Diagnosis not present

## 2023-08-29 DIAGNOSIS — Z86718 Personal history of other venous thrombosis and embolism: Secondary | ICD-10-CM | POA: Diagnosis not present

## 2023-08-29 NOTE — Patient Instructions (Addendum)
 Medication Instructions:  No changes *If you need a refill on your cardiac medications before your next appointment, please call your pharmacy*  Lab Work: None  Follow-Up: At [facility], you and your health needs are our priority.  As part of our continuing mission to provide you with exceptional heart care, our providers are all part of one team.  This team includes your primary Cardiologist (physician) and Advanced Practice Providers or APPs (Physician Assistants and Nurse Practitioners) who all work together to provide you with the care you need, when you need it.  Your next appointment:   6 month(s)  Provider:   Euell Herrlich, MD  or Marlana Silvan, NP or Ervin Heath, PA if MD does not have availability  Other Instructions Please call us  or send a MyChart message with any Cardiology related questions/concerns.  [phone].  Thank you!

## 2023-08-29 NOTE — Progress Notes (Signed)
  Cardiology Office Note:  .   Date:  08/29/2023  ID:  Scott Schneider, DOB June 26, 1940, MRN [id number] PCP: Gerome Brunet, DO  [facility] Providers Cardiologist:  Soyla DELENA Merck, MD    History of Present Illness: .   Scott Schneider is a 83 y.o. male.  Discussed the use of AI scribe software for clinical note transcription with the patient, who gave verbal consent to proceed.  History of Present Illness Scott Schneider is an 83 year old male who presents with dizziness and low blood pressure.  Dizziness occurs upon standing, associated with low blood pressure readings, the most recent being 97 mmHg SBP. He has been on tamsulosin  for prostate issues for 2.5 months and recently started Gemtesa for irregular urine flow. We suspect tamsulosin  may contribute to his symptoms.  He has a history of DVT/PE and is on Eliquis  twice daily for prevention. There are no reports of bleeding or palpitations, although he occasionally hears his heartbeat when lying on his right side. Swelling in the feet and legs occurs during the day and reduces overnight. Compression socks alleviate swelling and heaviness in the legs.    ROS: negative except per HPI above.  Studies Reviewed: .        Results DIAGNOSTIC Echocardiogram: Normal left ventricular systolic function, mildly reduced diastolic function, normal right ventricular function, very mild aortic root enlargement (08/15/2023) Ultrasound: Chronic thrombi Risk Assessment/Calculations:       Physical Exam:   VS:  BP (!) 106/58   Pulse 68   Ht 5' 10 (1.778 m)   Wt 193 lb 12.8 oz (87.9 kg)   SpO2 97%   BMI 27.81 kg/m    Wt Readings from Last 3 Encounters:  08/29/23 193 lb 12.8 oz (87.9 kg)  06/27/23 194 lb (88 kg)  06/19/23 194 lb (88 kg)     Physical Exam GENERAL: Alert, cooperative, well developed, no acute distress. HEENT: Normocephalic, normal oropharynx, moist mucous membranes. CHEST: Clear to auscultation bilaterally, no  wheezes, rhonchi, or crackles. CARDIOVASCULAR: Normal heart rate and rhythm, S1 and S2 normal without murmurs. ABDOMEN: Soft, non-tender, non-distended, without organomegaly, normal bowel sounds. EXTREMITIES: No cyanosis or edema. NEUROLOGICAL: Cranial nerves grossly intact, moves all extremities without gross motor or sensory deficit.   ASSESSMENT AND PLAN: .    Assessment and Plan Assessment & Plan Hypotension Intermittent dizziness likely due to hypotension, possibly exacerbated by tamsulosin . Discussed discontinuation of tamsulosin  and emphasized hydration and salt intake. - Discuss with Dr. Gerome about discontinuing tamsulosin  before refilling prescription. - Increase fluid intake and add a pinch of salt to diet. - Consider sports drinks on days with significant lightheadedness. - Monitor blood pressure to ensure it does not rise excessively.  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  - Continue apixaban  (Eliquis ) twice daily.  Chronic venous insufficiency Swelling and heaviness in legs due to chronic venous insufficiency and thromboembolic history. Compression stockings effective in reducing symptoms. - Continue wearing compression stockings.  Mild aortic dilation Mild aortic enlargement noted on echocardiogram. No immediate intervention required, monitor during routine echocardiograms.  Postoperative state following left hip replacement Recovery progressing well post-left hip replacement with improved mobility. No complications reported.  Follow-up Plan to monitor blood pressure and cardiovascular health. - Schedule follow-up appointment in 6 months. - Discuss with Dr. Gerome about discontinuing tamsulosin  before refilling prescription.      Soyla Merck, MD, FACC

## 2023-10-01 DIAGNOSIS — R350 Frequency of micturition: Secondary | ICD-10-CM | POA: Diagnosis not present

## 2023-10-01 DIAGNOSIS — N35012 Post-traumatic membranous urethral stricture: Secondary | ICD-10-CM | POA: Diagnosis not present

## 2023-10-07 ENCOUNTER — Telehealth: Payer: Self-pay | Admitting: Internal Medicine

## 2023-10-07 ENCOUNTER — Other Ambulatory Visit: Payer: Self-pay | Admitting: Urology

## 2023-10-07 NOTE — Telephone Encounter (Signed)
   Pre-operative Risk Assessment    Patient Name: Scott Schneider  DOB: 06-Jan-1941 MRN: [id number]   Date of last office visit: 08/29/23 Date of next office visit:  n/a   Request for Surgical Clearance    Procedure:  cystoscopy optilume dilation  Date of Surgery:  Clearance 10/21/23                                Surgeon:  Dr. Steffan Pea  Surgeon's Group or Practice Name:  [facility] Phone number:  [phone] Fax number:  [phone]   Type of Clearance Requested:   - Medical  - Pharmacy:  Hold Apixaban  (Eliquis ) TBD   Type of Anesthesia:  General    Additional requests/questions:     SignedBarbee DELENA Sharps   10/07/2023, 10:54 AM

## 2023-10-08 ENCOUNTER — Telehealth: Payer: Self-pay | Admitting: *Deleted

## 2023-10-08 NOTE — Telephone Encounter (Signed)
 Please advise holding Eliquis  prior to cystoscopy on 8/12.  Thank you!  DW

## 2023-10-08 NOTE — Patient Instructions (Addendum)
 SURGICAL WAITING ROOM VISITATION  Patients having surgery or a procedure may have no more than 2 support people in the waiting area - these visitors may rotate.    Children under the age of 59 must have an adult with them who is not the patient.  Visitors with respiratory illnesses are discouraged from visiting and should remain at home.  If the patient needs to stay at the hospital during part of their recovery, the visitor guidelines for inpatient rooms apply. Pre-op nurse will coordinate an appropriate time for 1 support person to accompany patient in pre-op.  This support person may not rotate.    Please refer to the [facility] website for the visitor guidelines for Inpatients (after your surgery is over and you are in a regular room).       Your procedure is scheduled on: 10-21-23   Report to [facility] Main Entrance    Report to admitting at      11:15  AM   Call this number if you have problems the morning of surgery [phone]   Do not eat food :After Midnight.   After Midnight you may have the following liquids until _0730_____ AM/  DAY OF SURGERY    THEN NOTHING BY MOUTH  Water  Black Coffee (NO MILK/CREAM OR CREAMERS, sugar ok)                                                             Sports drinks like Gatorade (NO RED)                         If you have questions, please contact your surgeon's office.   FOLLOW  ANY ADDITIONAL PRE OP INSTRUCTIONS YOU RECEIVED FROM YOUR SURGEON'S OFFICE!!!     Oral Hygiene is also important to reduce your risk of infection.                                    Remember - BRUSH YOUR TEETH THE MORNING OF SURGERY WITH YOUR REGULAR TOOTHPASTE  DENTURES WILL BE REMOVED PRIOR TO SURGERY PLEASE DO NOT APPLY Poly grip OR ADHESIVES!!!   Do NOT smoke after Midnight   Stop all vitamins and herbal supplements 7 days before surgery.   Take these medicines the morning of surgery with A SIP OF WATER : Vibegron (GEMTESA),  oxycodone  or tylenol  if needed,              HOLD ELIQUIS  PER MD INSTRUCTIONS   DO NOT TAKE ANY ORAL DIABETIC MEDICATIONS DAY OF YOUR SURGERY  Bring CPAP mask and tubing day of surgery.                              You may not have any metal on your body including hair pins, jewelry, and body piercing             Do not wear , lotions, powders, cologne, or deodorant               Men may shave face and neck.   Do not bring valuables to the hospital. [facility] IS NOT  RESPONSIBLE   FOR VALUABLES.   Contacts, glasses, dentures or bridgework may not be worn into surgery.   Bring small overnight bag day of surgery.   DO NOT BRING YOUR HOME MEDICATIONS TO THE HOSPITAL. PHARMACY WILL DISPENSE MEDICATIONS LISTED ON YOUR MEDICATION LIST TO YOU DURING YOUR ADMISSION IN THE HOSPITAL!    Patients discharged on the day of surgery will not be allowed to drive home.  Someone NEEDS to stay with you for the first 24 hours after anesthesia.   Special Instructions: Bring a copy of your healthcare power of attorney and living will documents the day of surgery if you haven't scanned them before.              Please read over the following fact sheets you were given: IF YOU HAVE QUESTIONS ABOUT YOUR PRE-OP INSTRUCTIONS PLEASE CALL [phone].    If you test positive for Covid or have been in contact with anyone that has tested positive in the last 10 days please notify you surgeon.     - Preparing for Surgery Before surgery, you can play an important role.  Because skin is not sterile, your skin needs to be as free of germs as possible.  You can reduce the number of germs on your skin by washing with CHG (chlorahexidine gluconate) soap before surgery.  CHG is an antiseptic cleaner which kills germs and bonds with the skin to continue killing germs even after washing. Please DO NOT use if you have an allergy to CHG or antibacterial soaps.  If your skin becomes reddened/irritated  stop using the CHG and inform your nurse when you arrive at Short Stay. Do not shave (including legs and underarms) for at least 48 hours prior to the first CHG shower.  You may shave your face/neck. Please follow these instructions carefully:  1.  Shower with CHG Soap the night before surgery and the  morning of Surgery.  2.  If you choose to wash your hair, wash your hair first as usual with your  normal  shampoo.  3.  After you shampoo, rinse your hair and body thoroughly to remove the  shampoo.                            4.  Use CHG as you would any other liquid soap.  You can apply chg directly  to the skin and wash                       Gently with a scrungie or clean washcloth.  5.  Apply the CHG Soap to your body ONLY FROM THE NECK DOWN.   Do not use on face/ open                           Wound or open sores. Avoid contact with eyes, ears mouth and genitals (private parts).                       Wash face,  Genitals (private parts) with your normal soap.             6.  Wash thoroughly, paying special attention to the area where your surgery  will be performed.  7.  Thoroughly rinse your body with warm water  from the neck down.  8.  DO NOT shower/wash with your normal soap after using  and rinsing off  the CHG Soap.                9.  Pat yourself dry with a clean towel.            10.  Wear clean pajamas.            11.  Place clean sheets on your bed the night of your first shower and do not  sleep with pets. Day of Surgery : Do not apply any lotions/deodorants the morning of surgery.  Please wear clean clothes to the hospital/surgery center.  FAILURE TO FOLLOW THESE INSTRUCTIONS MAY RESULT IN THE CANCELLATION OF YOUR SURGERY PATIENT SIGNATURE_________________________________  NURSE SIGNATURE__________________________________  ________________________________________________________________________

## 2023-10-08 NOTE — Telephone Encounter (Signed)
 [facility] RNCM 90 day call completed after Left THA.

## 2023-10-08 NOTE — Telephone Encounter (Signed)
 Dr. Loni,   You saw this patient on 08/29/2023. Per office protocol, will you please comment on medical clearance for cystoscopy on 8/12?  Please route your response to P [facility] DIV Preop. I will communicate with requesting office once you have given recommendations.   Thank you!  Barnie Hila, NP

## 2023-10-08 NOTE — Progress Notes (Addendum)
 PCP - Lonell Collet DO Cardiologist - Soyla Acharya,MD LOV 08-29-23 epic  clearance pending  PPM/ICD -  Device Orders -  Rep Notified -   Chest x-ray -  EKG - 04-30-23 epic Stress Test -  ECHO - 08-13-23 epic Cardiac Cath -   Sleep Study -  CPAP -   Fasting Blood Sugar -  Checks Blood Sugar _____ times a day  Blood Thinner Instructions:Eliquis   Aspirin  Instructions:  [facility] Protcol - PRE-SURGERY-n/a   COVID vaccine -  Activity-- Anesthesia review: PE ,DVT,NSTEMI, CAD  Patient denies shortness of breath, fever, cough and chest pain at PAT appointment   All instructions explained to the patient, with a verbal understanding of the material. Patient agrees to go over the instructions while at home for a better understanding. Patient also instructed to self quarantine after being tested for COVID-19. The opportunity to ask questions was provided.

## 2023-10-09 ENCOUNTER — Encounter (HOSPITAL_COMMUNITY)
Admission: RE | Admit: 2023-10-09 | Discharge: 2023-10-09 | Disposition: A | Discharge status: Home / Self Care | Source: Ambulatory Visit | Attending: Urology | Admitting: Urology

## 2023-10-09 ENCOUNTER — Other Ambulatory Visit: Payer: Self-pay

## 2023-10-09 ENCOUNTER — Encounter (HOSPITAL_COMMUNITY): Payer: Self-pay

## 2023-10-09 VITALS — BP 128/65 | HR 70 | Temp 98.2°F | Resp 16 | Ht 70.0 in | Wt 190.0 lb

## 2023-10-09 DIAGNOSIS — I252 Old myocardial infarction: Secondary | ICD-10-CM | POA: Diagnosis not present

## 2023-10-09 DIAGNOSIS — Z86711 Personal history of pulmonary embolism: Secondary | ICD-10-CM | POA: Diagnosis not present

## 2023-10-09 DIAGNOSIS — Z8673 Personal history of transient ischemic attack (TIA), and cerebral infarction without residual deficits: Secondary | ICD-10-CM | POA: Diagnosis not present

## 2023-10-09 DIAGNOSIS — Z79899 Other long term (current) drug therapy: Secondary | ICD-10-CM | POA: Insufficient documentation

## 2023-10-09 DIAGNOSIS — R338 Other retention of urine: Secondary | ICD-10-CM | POA: Diagnosis not present

## 2023-10-09 DIAGNOSIS — Z86718 Personal history of other venous thrombosis and embolism: Secondary | ICD-10-CM | POA: Insufficient documentation

## 2023-10-09 DIAGNOSIS — Z01812 Encounter for preprocedural laboratory examination: Secondary | ICD-10-CM | POA: Diagnosis not present

## 2023-10-09 DIAGNOSIS — I2510 Atherosclerotic heart disease of native coronary artery without angina pectoris: Secondary | ICD-10-CM | POA: Insufficient documentation

## 2023-10-09 DIAGNOSIS — Z7901 Long term (current) use of anticoagulants: Secondary | ICD-10-CM | POA: Diagnosis not present

## 2023-10-09 DIAGNOSIS — I214 Non-ST elevation (NSTEMI) myocardial infarction: Secondary | ICD-10-CM

## 2023-10-09 DIAGNOSIS — N35012 Post-traumatic membranous urethral stricture: Secondary | ICD-10-CM | POA: Insufficient documentation

## 2023-10-09 DIAGNOSIS — Z8546 Personal history of malignant neoplasm of prostate: Secondary | ICD-10-CM | POA: Insufficient documentation

## 2023-10-09 DIAGNOSIS — Z01818 Encounter for other preprocedural examination: Secondary | ICD-10-CM | POA: Diagnosis present

## 2023-10-09 DIAGNOSIS — N401 Enlarged prostate with lower urinary tract symptoms: Secondary | ICD-10-CM | POA: Diagnosis not present

## 2023-10-09 HISTORY — DX: Anxiety disorder, unspecified: F41.9

## 2023-10-09 LAB — BASIC METABOLIC PANEL WITH GFR
Anion gap: 10 (ref 5–15)
BUN: 11 mg/dL (ref 8–23)
CO2: 21 mmol/L — ABNORMAL LOW (ref 22–32)
Calcium: 9.1 mg/dL (ref 8.9–10.3)
Chloride: 107 mmol/L (ref 98–111)
Creatinine, Ser: 0.98 mg/dL (ref 0.61–1.24)
GFR, Estimated: >60 mL/min
Glucose, Bld: 98 mg/dL (ref 70–99)
Potassium: 4.1 mmol/L (ref 3.5–5.1)
Sodium: 138 mmol/L (ref 135–145)

## 2023-10-09 LAB — CBC
HCT: 37.3 % — ABNORMAL LOW (ref 39.0–52.0)
Hemoglobin: 12 g/dL — ABNORMAL LOW (ref 13.0–17.0)
MCH: 28.4 pg (ref 26.0–34.0)
MCHC: 32.2 g/dL (ref 30.0–36.0)
MCV: 88.4 fL (ref 80.0–100.0)
Platelets: 279 10*3/uL (ref 150–400)
RBC: 4.22 MIL/uL (ref 4.22–5.81)
RDW: 14.3 % (ref 11.5–15.5)
WBC: 4.6 10*3/uL (ref 4.0–10.5)
nRBC: 0 % (ref 0.0–0.2)

## 2023-10-10 NOTE — Progress Notes (Signed)
 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 never smoker with h/o TIA, CAD, DVT/PE, prostate cancer, urethral stricture scheduled  for above procedure 10/21/2023 with Dr. Steffan Shane.   Pt last seen by cardiology 08/29/23. Pt with hypotension with intermittent dizziness, possibly exacerbated by tamsulosin . Advised discontinuing and continue to monitor BP.   Per cardiology preoperative evaluation 10/10/23, -He is intermediate risk for general anesthesia and overall intermediate risk for a low risk procedure. OK to hold eliquis  for 2 days prior to procedure if needed but otherwise continue without interruption if stopping is not required. Resume eliquis  when stable from post procedural standpoint. -Dr. Acharya  Last dose of Eliquis  10/18/2023.  VS: BP 128/65   Pulse 70   Temp 36.8 C (Oral)   Resp 16   Ht 5' 10 (1.778 m)   Wt 86.2 kg   SpO2 100%   BMI 27.26 kg/m   PROVIDERS: Gerome Brunet, DO is PCP   Cardiologist - Soyla Merck, MD  LABS: Labs reviewed: Acceptable for surgery. (all labs ordered are listed, but only abnormal results are displayed)  Labs Reviewed  BASIC METABOLIC PANEL WITH GFR - Abnormal; Notable for the following components:      Result Value   CO2 21 (*)    All other components within normal limits  CBC - Abnormal; Notable for the following components:   Hemoglobin 12.0 (*)    HCT 37.3 (*)    All other components within normal limits      IMAGES:   EKG:   CV: Echo 08/13/2023 1. Left ventricular ejection fraction, by estimation, is 55 to 60%. The  left ventricle has normal function. The left ventricle has no regional  wall motion abnormalities. There is moderate left ventricular hypertrophy.  Left ventricular diastolic  parameters are consistent with Grade I diastolic dysfunction (impaired  relaxation). The average left ventricular global longitudinal strain is  -16.4 %.   2. Right ventricular systolic function is normal. The right ventricular  size is normal. There is normal pulmonary artery systolic pressure.   3. The mitral valve is normal in structure. Trivial mitral valve  regurgitation. No evidence of mitral stenosis.   4. Tricuspid valve regurgitation is mild to moderate.   5. The aortic valve is tricuspid. Aortic valve regurgitation is not  visualized. No aortic stenosis is present.   6. Aortic dilatation noted. There is mild dilatation of the ascending  aorta, measuring 40 mm.   7. The inferior vena cava is normal in size with greater than 50%  respiratory variability, suggesting right atrial pressure of 3 mmHg.   Cardiac Cath 04/25/2020 The left ventricular systolic function is normal. LV end diastolic pressure is normal. The left ventricular ejection fraction is 55-65% by visual estimate.   1. No significant coronary artery disease 2. Normal LV function 3. Normal LVEDP   Plan: medical management.  Past Medical History:  Diagnosis Date   Anxiety    Arthritis  CAD (coronary artery disease) 05/10/2020   pt states cath was clear   Depression    H/O back injury 1980's   Hyperlipidemia 05/10/2020   Low back pain    NSTEMI (non-ST elevated myocardial infarction) (HCC) 04/24/2020   pt states no MI was PE   Other spondylosis, lumbar region    Paresthesia of skin    Prostate cancer (HCC)    Radiculopathy    lower back   Spinal stenosis of lumbar region    Syncope and collapse 04/26/2020    TIA (transient ischemic attack)    seen on imaging per pt    Past Surgical History:  Procedure Laterality Date   LEFT HEART CATH AND CORONARY ANGIOGRAPHY N/A 04/25/2020   Procedure: LEFT HEART CATH AND CORONARY ANGIOGRAPHY;  Surgeon: [place], Peter M, MD;  Location: [facility] INVASIVE CV LAB;  Service: Cardiovascular;  Laterality: N/A;   PROSTATE BIOPSY     TONSILLECTOMY     early 20's   TOTAL HIP ARTHROPLASTY Left 06/27/2023   Procedure: ARTHROPLASTY, HIP, TOTAL, ANTERIOR APPROACH;  Surgeon: Vernetta Lonni GRADE, MD;  Location: [facility];  Service: Orthopedics;  Laterality: Left;    MEDICATIONS:  acetaminophen  (TYLENOL ) 325 MG tablet   amoxicillin (AMOXIL) 875 MG tablet   ELIQUIS  5 MG TABS tablet   latanoprost  (XALATAN ) 0.005 % ophthalmic solution   tamsulosin  (FLOMAX ) 0.4 MG CAPS capsule   No current facility-administered medications for this encounter.     Harlene Hoots Ward, PA-C [facility] [phone]

## 2023-10-10 NOTE — Telephone Encounter (Signed)
   Patient Name: Scott Schneider  DOB: 1941/01/05 MRN: [id number]  Primary Cardiologist: Soyla DELENA Merck, MD  Chart reviewed as part of pre-operative protocol coverage. Pre-op clearance already addressed by colleagues in earlier phone notes. To summarize recommendations:  -He is intermediate risk for general anesthesia and overall intermediate risk for a low risk procedure. OK to hold eliquis  for 2 days prior to procedure if needed but otherwise continue without interruption if stopping is not required. Resume eliquis  when stable from post procedural standpoint. -Dr. Merck  Will route this bundled recommendation to requesting provider via Epic fax function and remove from pre-op pool. Please call with questions.  Orren LOISE Fabry, PA-C 10/10/2023, 8:07 AM

## 2023-10-19 NOTE — H&P (Signed)
 83 year old male with last seen by Dr. Noretta Rogers in 2021. He has a history of prostate cancer diagnosed in 2017 he underwent extremity radiation treatment for this. Pathology was significant for grade group 3 disease as well as grade group 1 disease. Patient was treated in 2018. He also has a history of gross hematuria. He is also bipolar urinary tract symptoms.   PMH: pt on Eliquis  for DVT and PE, has been on Eliquis , managed by Dr. Loni with [facility]. No MI CVA, no COPD asmthma or OSA. No DM.   Prostate cancer:  08/18/2023: no recent PSA. Pt has lost 20lbs, no changes in appetite.  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 Pt is currently on tamsulosin .  10/01/2023: Concerns for stricture cystoscopy today. Stricture in membranous urethra 6 [place]   Gross hematuria:  Sick/9/25: no blood in the urine currently.  10/01/2023: Cystoscopy today shows dense bulbar vs membranous urethral stricture  10/21/23: Cysto biopsy today     ALLERGIES: No Allergies    MEDICATIONS: Gemtesa 75 MG Tablet 1 tablet PO Daily NCPDP [id number]  Tamsulosin  HCl 0.4 MG Capsule  Eliquis  5 MG Tablet  Latanorost     GU PSH: Cystoscopy - 2021 PLACE RT DEVICE/MARKER, PROS - 2017 Prostate Needle Biopsy - 2017       PSH Notes: Tonsillectomy   NON-GU PSH: Remove Tonsils - 2017 Surgical Pathology, Gross And Microscopic Examination For Prostate Needle - 2017 Visit Complexity (formerly GPC1X) - 08/18/2023     GU PMH: BPH w/LUTS - 08/18/2023 History of prostate cancer - 08/18/2023, His PSA has now reached a nadir level. He does have occasional hematuria that I think is due to radiation induced change., - 2021 Urinary Frequency - 08/18/2023 Gross hematuria - 2022, - 2021, Most likely secondary to radiation induced change, - 2021 Prostate Cancer - 2022, - 2021, He has had no follow-up since the week following his last radiation treatment. Will check PSA today. DRE today  normal. , - 2021 (Improving), He has had excellent success following his EBRT with regards to his PSA, which was most recently 1.6. He has had no sequelae to his radiotherapy., - 2018, - 2017, - 2017 Elevated PSA - 2017, (Worsening), his PSA is climbing, and this requires ultrasound and biopsy, - 2017, Elevated PSA, - 2017 ED due to arterial insufficiency, Erectile dysfunction due to arterial insufficiency - 2017    NON-GU PMH: Encounter for general adult medical examination without abnormal findings, Encounter for preventive health examination - 2017 Personal history of other diseases of the nervous system and sense organs, History of glaucoma - 2017 Personal history of other endocrine, nutritional and metabolic disease, History of hypercholesterolemia - 2017 Arthritis    FAMILY HISTORY: Death of family member - Runs In Family hepatic cirrhosis - Runs In Family malignant neoplasm of breast - Runs In Family   SOCIAL HISTORY: Marital Status: Married Preferred Language: English; Ethnicity: Not Hispanic Or Latino; Race: Black or African American Current Smoking Status: Patient has never smoked.   Tobacco Use Assessment Completed: Used Tobacco in last 30 days? Has never drank.  Does not drink caffeine. Patient's occupation is/was retired.    REVIEW OF SYSTEMS:    GU Review Male:   Patient denies frequent urination, hard to postpone urination, burning/ pain with urination, get up at night to urinate, leakage of urine, stream starts and stops, trouble starting your stream, have to strain to urinate ,  erection problems, and penile pain.  Gastrointestinal (Upper):   Patient denies nausea, vomiting, and indigestion/ heartburn.  Gastrointestinal (Lower):   Patient denies diarrhea and constipation.  Constitutional:   Patient denies fever, night sweats, weight loss, and fatigue.  Skin:   Patient denies skin rash/ lesion and itching.  Eyes:   Patient denies blurred vision and double vision.  Ears/  Nose/ Throat:   Patient denies sinus problems and sore throat.  Hematologic/Lymphatic:   Patient denies swollen glands and easy bruising.  Cardiovascular:   Patient denies leg swelling and chest pains.  Respiratory:   Patient denies cough and shortness of breath.  Endocrine:   Patient denies excessive thirst.  Musculoskeletal:   Patient denies back pain and joint pain.  Neurological:   Patient denies headaches and dizziness.  Psychologic:   Patient denies depression and anxiety.   VITAL SIGNS: None   Complexity of Data:  Source Of History:  Patient  Urine Test Review:   Urinalysis   08/18/23 10/18/19 04/21/19 10/22/16 09/26/15 04/06/15  PSA  Total PSA 0.21 ng/mL 0.27 ng/mL 0.63 ng/mL 1.60 ng/mL 7.42  4.04     PROCEDURES:         Flexible Cystoscopy - 52000  Risks, benefits, and some of the potential complications of the procedure were discussed at length with the patient including infection, bleeding, voiding discomfort, urinary retention, fever, chills, sepsis, and others. All questions were answered. Informed consent was obtained. Antibiotic prophylaxis was given. Sterile technique and intraurethral analgesia were used.  Patient given ceftriaxone prior to procedure due to recent hip replacement.  Meatus:  Normal size. Normal location. Normal condition.  Urethra:  Membranous urethral stricture possibly bulbar irregularities to the urethra.  External Sphincter:  Staff stricture of the membranous urethra. Narrow 6 [place].      The lower urinary tract was carefully examined. The procedure was well-tolerated and without complications. Antibiotic instructions were given. Instructions were given to call the office immediately for bloody urine, difficulty urinating, urinary retention, painful or frequent urination, fever, chills, nausea, vomiting or other illness. The patient stated that he understood these instructions and would comply with them.         Visit Complexity - G2211           Urinalysis w/Scope Dipstick Dipstick Cont'd Micro  Color: Yellow Bilirubin: Neg mg/dL WBC/hpf: 10 - 79/yeq  Appearance: Slightly Cloudy Ketones: Neg mg/dL RBC/hpf: NS (Not Seen)  Specific Gravity: 1.015 Blood: Trace ery/uL Bacteria: Few (10-25/hpf)  pH: 6.0 Protein: Trace mg/dL Cystals: NS (Not Seen)  Glucose: Neg mg/dL Urobilinogen: 0.2 mg/dL Casts: NS (Not Seen)    Nitrites: Neg Trichomonas: Not Present    Leukocyte Esterase: 2+ leu/uL Mucous: Not Present      Epithelial Cells: 0 - 5/hpf      Yeast: NS (Not Seen)      Sperm: Not Present         Ceftriaxone 1g - 03627, G9303 0 wasted   Qty: 1 Adm. By: Shanta Colmore  Unit: gram Adm. On: 10/01/2023 01:33 PM  Route: IM Lot No: [id number]  Freq: None Exp. 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 Patient is agreeable to procedure urine culture ordered today   Patient does have a history of DVT on Pt has cardiac clearance and has stopped Eliquis .   PT sent amoxicillin  for enterococcus which he has started   Reasons risk-benefit alternatives to Optilume dilation including bleeding infection demonstrating structures possible stricture recurrence need for catheter postoperatively possible need for suprapubic tube and need for wear condom when having sexual activity for the 6 months after procedure patient voiced understanding consent was obtained.

## 2023-10-20 MED ORDER — GENTAMICIN SULFATE 40 MG/ML IJ SOLN
5.0000 mg/kg | INTRAVENOUS | Status: AC
  Administered 2023-10-21 (×2): 390 mg via INTRAVENOUS
  Filled 2023-10-20: qty 9.75

## 2023-10-21 ENCOUNTER — Ambulatory Visit (HOSPITAL_COMMUNITY)
Admission: RE | Admit: 2023-10-21 | Discharge: 2023-10-21 | Disposition: A | Discharge status: Home / Self Care | Attending: Urology | Admitting: Urology

## 2023-10-21 ENCOUNTER — Other Ambulatory Visit (HOSPITAL_COMMUNITY): Payer: Self-pay

## 2023-10-21 ENCOUNTER — Ambulatory Visit (HOSPITAL_COMMUNITY): Payer: Self-pay | Admitting: Medical

## 2023-10-21 ENCOUNTER — Encounter (HOSPITAL_COMMUNITY): Payer: Self-pay | Admitting: Urology

## 2023-10-21 ENCOUNTER — Ambulatory Visit (HOSPITAL_BASED_OUTPATIENT_CLINIC_OR_DEPARTMENT_OTHER): Admitting: Anesthesiology

## 2023-10-21 ENCOUNTER — Other Ambulatory Visit: Payer: Self-pay

## 2023-10-21 ENCOUNTER — Encounter (HOSPITAL_COMMUNITY)
Admission: RE | Disposition: A | Discharge status: Home / Self Care | Payer: Self-pay | Source: Home / Self Care | Attending: Urology

## 2023-10-21 ENCOUNTER — Ambulatory Visit (HOSPITAL_COMMUNITY)

## 2023-10-21 DIAGNOSIS — I214 Non-ST elevation (NSTEMI) myocardial infarction: Secondary | ICD-10-CM

## 2023-10-21 DIAGNOSIS — I2510 Atherosclerotic heart disease of native coronary artery without angina pectoris: Secondary | ICD-10-CM | POA: Diagnosis not present

## 2023-10-21 DIAGNOSIS — I252 Old myocardial infarction: Secondary | ICD-10-CM | POA: Insufficient documentation

## 2023-10-21 DIAGNOSIS — R310 Gross hematuria: Secondary | ICD-10-CM | POA: Diagnosis not present

## 2023-10-21 DIAGNOSIS — N35919 Unspecified urethral stricture, male, unspecified site: Secondary | ICD-10-CM | POA: Diagnosis present

## 2023-10-21 DIAGNOSIS — N201 Calculus of ureter: Secondary | ICD-10-CM | POA: Diagnosis not present

## 2023-10-21 MED ORDER — AMOXICILLIN 875 MG PO TABS
875.0000 mg | ORAL_TABLET | Freq: Two times a day (BID) | ORAL | 0 refills | Status: AC
  Filled 2023-10-21: qty 6, 3d supply, fill #0

## 2023-10-21 MED ORDER — FENTANYL CITRATE (PF) 100 MCG/2ML IJ SOLN
INTRAMUSCULAR | Status: DC | PRN
  Administered 2023-10-21 (×8): 25 ug via INTRAVENOUS

## 2023-10-21 MED ORDER — PROPOFOL 10 MG/ML IV BOLUS
INTRAVENOUS | Status: DC | PRN
  Administered 2023-10-21 (×2): 150 mg via INTRAVENOUS

## 2023-10-21 MED ORDER — EPHEDRINE SULFATE-NACL 50-0.9 MG/10ML-% IV SOSY
PREFILLED_SYRINGE | INTRAVENOUS | Status: DC | PRN
  Administered 2023-10-21 (×2): 5 mg via INTRAVENOUS

## 2023-10-21 MED ORDER — DEXAMETHASONE SODIUM PHOSPHATE 10 MG/ML IJ SOLN
INTRAMUSCULAR | Status: DC | PRN
  Administered 2023-10-21 (×2): 10 mg via INTRAVENOUS

## 2023-10-21 MED ORDER — ACETAMINOPHEN 10 MG/ML IV SOLN
INTRAVENOUS | Status: AC
  Filled 2023-10-21: qty 100

## 2023-10-21 MED ORDER — SODIUM CHLORIDE 0.9 % IR SOLN
Status: DC | PRN
  Administered 2023-10-21 (×2): 3000 mL via INTRAVESICAL

## 2023-10-21 MED ORDER — METHOCARBAMOL 750 MG PO TABS
750.0000 mg | ORAL_TABLET | Freq: Four times a day (QID) | ORAL | 0 refills | Status: AC
  Filled 2023-10-21: qty 20, 5d supply, fill #0

## 2023-10-21 MED ORDER — STERILE WATER FOR IRRIGATION IR SOLN
Status: DC | PRN
  Administered 2023-10-21 (×2): 500 mL

## 2023-10-21 MED ORDER — PROPOFOL 10 MG/ML IV BOLUS
INTRAVENOUS | Status: AC
  Filled 2023-10-21: qty 20

## 2023-10-21 MED ORDER — FENTANYL CITRATE (PF) 100 MCG/2ML IJ SOLN
INTRAMUSCULAR | Status: AC
  Filled 2023-10-21: qty 2

## 2023-10-21 MED ORDER — CHLORHEXIDINE GLUCONATE 0.12 % MT SOLN
15.0000 mL | Freq: Once | OROMUCOSAL | Status: AC
  Administered 2023-10-21 (×2): 15 mL via OROMUCOSAL

## 2023-10-21 MED ORDER — POLYETHYLENE GLYCOL 3350 17 GM/SCOOP PO POWD
17.0000 g | Freq: Every day | ORAL | 0 refills | Status: AC
  Filled 2023-10-21: qty 238, 14d supply, fill #0

## 2023-10-21 MED ORDER — AMISULPRIDE (ANTIEMETIC) 5 MG/2ML IV SOLN: 10.0000 mg | Freq: Once | INTRAVENOUS | Status: DC | PRN

## 2023-10-21 MED ORDER — EPHEDRINE 5 MG/ML INJ
INTRAVENOUS | Status: AC
  Filled 2023-10-21: qty 5

## 2023-10-21 MED ORDER — ACETAMINOPHEN 10 MG/ML IV SOLN
INTRAVENOUS | Status: DC | PRN
  Administered 2023-10-21 (×2): 1000 mg via INTRAVENOUS

## 2023-10-21 MED ORDER — IOHEXOL 300 MG/ML  SOLN
INTRAMUSCULAR | Status: DC | PRN
  Administered 2023-10-21 (×2): 8 mL

## 2023-10-21 MED ORDER — ONDANSETRON HCL 4 MG/2ML IJ SOLN
INTRAMUSCULAR | Status: DC | PRN
  Administered 2023-10-21 (×2): 4 mg via INTRAVENOUS

## 2023-10-21 MED ORDER — BUPIVACAINE HCL (PF) 0.5 % IJ SOLN
INTRAMUSCULAR | Status: AC
  Filled 2023-10-21: qty 30

## 2023-10-21 MED ORDER — FENTANYL CITRATE PF 50 MCG/ML IJ SOSY: 25.0000 ug | PREFILLED_SYRINGE | INTRAMUSCULAR | Status: DC | PRN

## 2023-10-21 MED ORDER — LIDOCAINE HCL (PF) 2 % IJ SOLN
INTRAMUSCULAR | Status: AC
  Filled 2023-10-21: qty 5

## 2023-10-21 MED ORDER — PHENYLEPHRINE 80 MCG/ML (10ML) SYRINGE FOR IV PUSH (FOR BLOOD PRESSURE SUPPORT)
PREFILLED_SYRINGE | INTRAVENOUS | Status: AC
  Filled 2023-10-21: qty 10

## 2023-10-21 MED ORDER — LACTATED RINGERS IV SOLN: INTRAVENOUS | Status: DC

## 2023-10-21 MED ORDER — ONDANSETRON HCL 4 MG/2ML IJ SOLN
INTRAMUSCULAR | Status: AC
  Filled 2023-10-21: qty 2

## 2023-10-21 MED ORDER — SODIUM CHLORIDE 0.9 % IV SOLN
1.0000 g | Freq: Once | INTRAVENOUS | Status: AC
  Administered 2023-10-21 (×2): 1 g via INTRAVENOUS
  Filled 2023-10-21: qty 1000

## 2023-10-21 MED ORDER — LIDOCAINE HCL (CARDIAC) PF 100 MG/5ML IV SOSY
PREFILLED_SYRINGE | INTRAVENOUS | Status: DC | PRN
  Administered 2023-10-21 (×2): 60 mg via INTRAVENOUS

## 2023-10-21 MED ORDER — DEXAMETHASONE SODIUM PHOSPHATE 10 MG/ML IJ SOLN
INTRAMUSCULAR | Status: AC
  Filled 2023-10-21: qty 1

## 2023-10-21 MED ORDER — ORAL CARE MOUTH RINSE: 15.0000 mL | Freq: Once | OROMUCOSAL | Status: AC

## 2023-10-21 SURGICAL SUPPLY — 1 items: NDL HYPO 22X1.5 SAFETY MO (MISCELLANEOUS) ×1 IMPLANT

## 2023-10-21 NOTE — Anesthesia Procedure Notes (Signed)
 Procedure Name: LMA Insertion Date/Time: 10/21/2023 2:22 PM  Performed by: Therisa Doyal CROME, CRNAPatient Re-evaluated:Patient Re-evaluated prior to induction Oxygen Delivery Method: Circle system utilized Preoxygenation: Pre-oxygenation with 100% oxygen Induction Type: IV induction LMA: LMA inserted LMA Size: 4.0 Number of attempts: 1 Placement Confirmation: positive ETCO2 and breath sounds checked- equal and bilateral Tube secured with: Tape Dental Injury: Teeth and Oropharynx as per pre-operative assessment

## 2023-10-21 NOTE — Anesthesia Postprocedure Evaluation (Signed)
 Anesthesia Post Note  Patient: Scott Schneider  Procedure(s) Performed: CYSTOURETHROSCOPY, WITH URETHRAL STRICTURE DILATION USING DRUG-COATED BALLOON CYSTOSCOPY WITH RETROGRADE URETHROGRAM     Patient location during evaluation: PACU Anesthesia Type: General Level of consciousness: awake and alert Pain management: pain level controlled Vital Signs Assessment: post-procedure vital signs reviewed and stable Respiratory status: spontaneous breathing, nonlabored ventilation, respiratory function stable and patient connected to nasal cannula oxygen Cardiovascular status: blood pressure returned to baseline and stable Postop Assessment: no apparent nausea or vomiting Anesthetic complications: no   No notable events documented.  Last Vitals:  Vitals:   10/21/23 1615 10/21/23 1630  BP: (!) 148/76 (!) 146/74  Pulse: (!) 55 61  Resp:    Temp: 36.6 C   SpO2: 100% 98%    Last Pain:  Vitals:   10/21/23 1615  TempSrc: Oral  PainSc: 0-No pain                 Epifanio Lamar FORBES

## 2023-10-21 NOTE — Op Note (Signed)
 Preoperative diagnosis: Urethral stricture and gross hematuria.  Postop diagnosis: Urethral stricture  Procedure performed: Retrograde urethrogram Optilume urethral dilation with chemotherapy coated balloon. Cystoscopy.  Surgeon: Dr. Steffan Pea  Operative findings: Narrow membranous urethral stricture less than 2 cm in length 4 French in diameter. Stricture dilated to 24 [place] and Optilume and chemotherapy coated balloon applied for 3 minutes. No tumors stones or other abnormalities noted in the bladder 18 French catheter placed in the case.  Retrograde urethrogram findings: Narrow stricture with minimal contrast entering the bladder.  Anesthesia: General  Antibiotics: Ampicillin , gentamicin   Fluids: Per anesthesia  Drains: 18 French council catheter 10 cc balloon  Specimens: None  EBL: Minimal  Complications: None  Indication for procedure: [name] is a 83 year old gentleman with history of prostate cancer status post external beam radiation he since had gross hematuria as well as urethral stricture secondary to his radiation therapy he is here today for management of that stricture.  We discussed respect to the alternatives to the procedure including bleed infection damage to surrounding structures possible stricture recurrence and possible need for suprapubic tube.  Patient voices understanding consent was obtained prior to surgery.  Procedure in detail: After informed consent was confirmed in the preoperative patient was taken to the operative suite placed under anesthesia by the anesthesia team.  He was then prepped and draped in usual sterile fashion in the dorsolithotomy position next time was performed verifying correct patient procedure.  Patient was given his preoperative antibiotics.  22 French cystoscope was then advanced into to the urethra.  Upon encountering the membranous urethra a small stricture was found.  A retrograde urethrogram was performed  demonstrating a narrow stricture with minimal contrast entering the bladder.  Length of stricture was unable to be obtained.  Next a sensor wire was then placed through the stricture this went into the bladder confirmed by fluoroscopy.  Then the UroMax balloon was used to dilate the stricture to 24 [place] at 20 ATM.  Once this was done the cystoscope was then brought back into the urethra identified the stricture stricture was less than 2 cm.  Assessment of the bladder demonstrated no tumors stones or other abnormalities no concerning findings for the gross hematuria.  Next the Optilume urethral dilation device was inserted into the urethra is inflated to 10 ATM and left in place for 3 minutes.  The proper placement of this was confirmed with fluoroscopy as well as visual position with the cystoscope.  After this was done the scope was removed and over the sensor wire that remained in place during the whole case that 18 [place] council catheter was placed without complication 10 cc were inflated balloon.  The patient was then extubated Opyd suite and taken the PACU in stable condition patient tolerated the procedure well.   Disposition: Patient will follow-up in 1 week for void trial

## 2023-10-21 NOTE — Discharge Instructions (Addendum)
 Discharge instructions following urethral dilation   Call your doctor for: Fever is greater than 100.5 Severe nausea or vomiting Increasing pain not controlled by pain medication Catheter no longer draining  Clots in the catheter that are larger than the size of a quarter   .   DO NOT LET AN ER DOCTOR TOUCH THE CATHETER ONLY A UROLOGIST CAN TROUBLE SHOOT THE CATHETER   Medication: - methocarbamol   - miralax  - amoxicillin    You may restart Eliquis  on Thursday 10/23/23.   The number for questions or concerns is [phone]  Activity level: No lifting greater than 10 pounds (about equal to milk) for the next 3 weeks or until cleared to do so at follow-up appointment.  Otherwise activity as tolerated by comfort level.  Diet: May resume your regular diet as tolerated. Do not eat salty sharp food such as Tostitos chips until the cheek heals this should take a few days   Driving: No driving while still taking opiate pain medications (weight at least 6-8 hours after last dose).  No driving if you still sore from surgery as it may limit her ability to react quickly if necessary.   Shower/bath: May shower and get incision wet pad 24 hours post surgery.  Do not scrub vigorously for the next 2-3 weeks.  Do not soak incision (ID soaking in bath or swimming) until 3 weeks post incision.   Wound care: None   Foley Catheter Care A soft, flexible tube (Foley catheter) may have been placed in your bladder to drain urine and fluid. Follow these instructions: Taking Care of the Catheter Keep the area where the catheter leaves your body clean.  Attach the catheter to the leg so there is no tension on the catheter.  Keep the drainage bag below the level of the bladder, but keep it OFF the floor.  Do not take long soaking baths. Your caregiver will give instructions about showering.  Wash your hands before touching ANYTHING related to the catheter or bag.  Using mild soap and warm water  on a  washcloth:  Clean the area closest to the catheter insertion site using a circular motion around the catheter.  Clean the catheter itself by wiping AWAY from the insertion site for several inches down the tube.  NEVER wipe upward as this could sweep bacteria up into the urethra (tube in your body that normally drains the bladder) and cause infection.  Place a small amount of sterile lubricant at the tip of the penis where the catheter is entering.  Taking Care of the Drainage Bags Two drainage bags may be taken home: a large overnight drainage bag, and a smaller leg bag which fits underneath clothing.  It is okay to wear the overnight bag at any time, but NEVER wear the smaller leg bag at night.  Keep the drainage bag well below the level of your bladder. This prevents backflow of urine into the bladder and allows the urine to drain freely.  Anchor the tubing to your leg to prevent pulling or tension on the catheter. Use tape or a leg strap provided by the hospital.  Empty the drainage bag when it is 1/2 to 3/4 full. Wash your hands before and after touching the bag.  Periodically check the tubing for kinks to make sure there is no pressure on the tubing which could restrict the flow of urine.  Changing the Drainage Bags Cleanse both ends of the clean bag with alcohol before changing.  Pinch off the  rubber catheter to avoid urine spillage during the disconnection.  Disconnect the dirty bag and connect the clean one.  Empty the dirty bag carefully to avoid a urine spill.  Attach the new bag to the leg with tape or a leg strap.  Cleaning the Drainage Bags Whenever a drainage bag is disconnected, it must be cleaned quickly so it is ready for the next use.  Wash the bag in warm, soapy water .  Rinse the bag thoroughly with warm water .  Soak the bag for 30 minutes in a solution of white vinegar and water  (1 cup vinegar to 1 quart warm water ).  Rinse with warm water .   IT Normal To See Some blood  in the urine is normal, as long as you can see your fingers through the catheter tubing (not the bag) this is ok. As long as the urine is not the consistency of tomato paste.  It will be normal to feel the urge to urinate often this is a side effect of the catheter  Some discharge around the catheter where it exits the penis is normal  SEEK MEDICAL CARE IF:  You have chills or night sweats.  You are leaking around your catheter or have problems with your catheter. It is not uncommon to have sporadic leakage around your catheter as a result of bladder spasms. If the leakage stops, there is not much need for concern. If you are uncertain, call your caregiver.  You develop side effects that you think are coming from your medicines.  SEEK IMMEDIATE MEDICAL CARE IF:  You are suddenly unable to urinate. Check to see if there are any kinks in the drainage tubing that may cause this. If you cannot find any kinks, call your caregiver immediately. This is an emergency.  You develop shortness of breath or chest pains.  Bleeding persists or clots develop in your urine.  You have a fever.  You develop pain in your back or over your lower belly (abdomen).  You develop pain or swelling in your legs.  Any problems you are having get worse rather than better.  MAKE SURE YOU:  Understand these instructions.  Will watch your condition.  Will get help right away if you are not doing well or get worse.     Follow-up appointments: Follow-up appointment will be scheduled with Dr. Shane for void trial in 1 week .

## 2023-10-21 NOTE — Anesthesia Preprocedure Evaluation (Signed)
 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  Reproductive/Obstetrics                              Anesthesia Physical Anesthesia Plan  ASA: 3  Anesthesia Plan: General   Post-op Pain Management: Ofirmev  IV (intra-op)* and Minimal or no pain anticipated   Induction: Intravenous  PONV Risk Score and Plan: 2 and Treatment may vary due to age or medical condition, Ondansetron  and Dexamethasone   Airway Management Planned: LMA  Additional Equipment: None  Intra-op Plan:   Post-operative Plan: Extubation in OR  Informed Consent: I have reviewed the patients History and Physical, chart, labs and discussed the procedure including the risks, benefits and alternatives for the proposed anesthesia with the patient or authorized representative who has indicated his/her understanding and acceptance.       Plan Discussed with: CRNA  Anesthesia Plan Comments: (PAT note by Lynwood Hope, PA-C: 83 year old male follows with cardiology for history of CAD, TIA, PACs, DVT/PE, HLD, near-syncope.  Cath 04/25/2020 showed normal coronaries, normal LV function.  Echo 11/16/2020 showed EF 55 to 60%, grade 1 DD, no significant valvular abnormalities.  Seen by Damien Nam, NP on 04/30/2023 for preop eval.  Per note,  According to the Revised Cardiac Risk Index (RCRI), his Perioperative Risk of Major Cardiac Event is (%): 0.4. His Functional Capacity in METs is: 5.07 according to the Duke Activity Status Index (DASI). Therefore, based on ACC/AHA guidelines, patient would be at acceptable risk for the planned procedure without further cardiovascular testing.  Pt takes Eliquis  for history of PE/DVT.  This is managed per PCP.  For holding Eliquis  prior to surgery should come from managing provider (PCP).  EKG 04/30/2023: Sinus rhythm with 1st degree A-V block with Premature supraventricular complexes.  Rate 75.  TTE 11/16/2020: 1. Left ventricular ejection fraction, by estimation, is 55 to 60%. The  left ventricle has normal function. The left ventricle has no regional  wall motion abnormalities. There is mild concentric left ventricular  hypertrophy. Left ventricular diastolic  parameters are consistent with Grade I diastolic dysfunction (impaired  relaxation).  2. Right ventricular systolic function was not well visualized. The right  ventricular size is normal. Tricuspid regurgitation signal is inadequate  for assessing PA pressure.  3. The mitral valve is normal in structure. Trivial mitral valve  regurgitation. No evidence of mitral stenosis.  4.  The aortic valve is tricuspid. Aortic valve regurgitation is not  visualized. Mild aortic valve sclerosis is present, with no evidence of  aortic valve stenosis.  5. Aortic dilatation noted. 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   Cath  04/25/2020:   The left ventricular systolic function is normal.  LV end diastolic pressure is normal.  The left ventricular ejection fraction is 55-65% by visual estimate.   1. No significant coronary artery disease 2. Normal LV function 3. Normal LVEDP  Plan: medical management.    )         Anesthesia Quick Evaluation

## 2023-10-21 NOTE — Transfer of Care (Signed)
 Immediate Anesthesia Transfer of Care Note  Patient: Scott Schneider  Procedure(s) Performed: CYSTOURETHROSCOPY, WITH URETHRAL STRICTURE DILATION USING DRUG-COATED BALLOON CYSTOSCOPY WITH RETROGRADE URETHROGRAM  Patient Location: PACU  Anesthesia Type:General  Level of Consciousness: drowsy  Airway & Oxygen Therapy: Patient Spontanous Breathing and Patient connected to face mask oxygen  Post-op Assessment: Report given to RN and Post -op Vital signs reviewed and stable  Post vital signs: Reviewed and stable  Last Vitals:  Vitals Value Taken Time  BP 133/70 10/21/23 15:15  Temp    Pulse 52 10/21/23 15:16  Resp 12 10/21/23 15:16  SpO2 100 % 10/21/23 15:16  Vitals shown include unfiled device data.  Last Pain:  Vitals:   10/21/23 1143  TempSrc: Oral  PainSc: 0-No pain         Complications: No notable events documented.

## 2023-10-22 ENCOUNTER — Encounter (HOSPITAL_COMMUNITY): Payer: Self-pay | Admitting: Urology

## 2023-10-28 DIAGNOSIS — R350 Frequency of micturition: Secondary | ICD-10-CM | POA: Diagnosis not present

## 2023-10-28 DIAGNOSIS — N35012 Post-traumatic membranous urethral stricture: Secondary | ICD-10-CM | POA: Diagnosis not present

## 2023-11-05 DIAGNOSIS — H401134 Primary open-angle glaucoma, bilateral, indeterminate stage: Secondary | ICD-10-CM | POA: Diagnosis not present

## 2023-11-25 DIAGNOSIS — N35012 Post-traumatic membranous urethral stricture: Secondary | ICD-10-CM | POA: Diagnosis not present

## 2023-11-25 DIAGNOSIS — R350 Frequency of micturition: Secondary | ICD-10-CM | POA: Diagnosis not present

## 2023-12-02 DIAGNOSIS — N390 Urinary tract infection, site not specified: Secondary | ICD-10-CM | POA: Diagnosis not present

## 2023-12-02 DIAGNOSIS — Z79899 Other long term (current) drug therapy: Secondary | ICD-10-CM | POA: Diagnosis not present

## 2023-12-09 DIAGNOSIS — M1612 Unilateral primary osteoarthritis, left hip: Secondary | ICD-10-CM | POA: Diagnosis not present

## 2023-12-09 DIAGNOSIS — R8290 Unspecified abnormal findings in urine: Secondary | ICD-10-CM | POA: Diagnosis not present

## 2023-12-09 DIAGNOSIS — Z23 Encounter for immunization: Secondary | ICD-10-CM | POA: Diagnosis not present

## 2024-01-12 ENCOUNTER — Encounter: Payer: Self-pay | Admitting: Radiology

## 2024-02-09 ENCOUNTER — Ambulatory Visit: Admitting: Orthopaedic Surgery

## 2024-02-09 ENCOUNTER — Encounter: Payer: Self-pay | Admitting: Orthopaedic Surgery

## 2024-02-09 ENCOUNTER — Other Ambulatory Visit

## 2024-02-09 DIAGNOSIS — Z96642 Presence of left artificial hip joint: Secondary | ICD-10-CM | POA: Diagnosis not present

## 2024-02-09 DIAGNOSIS — M1611 Unilateral primary osteoarthritis, right hip: Secondary | ICD-10-CM

## 2024-02-09 NOTE — Progress Notes (Signed)
 The patient is an 83 year old gentleman who is getting close to 8 months status post a left total hip replacement to treat significant left hip pain and arthritis.  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  He does ambulate using a cane.  He has no issues with his left total hip and reports good range of motion and strength with the left hip.  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  His right hip is stiff with internal and external rotation and his pain is minimal though.  Standing AP pelvis and lateral of the left hip shows a well-seated left total hip arthroplasty.  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  The left hip components are bone ingrown.  This point follow-up for his left hip can be as needed since he is doing so well at 8 months after surgery.  If he does develop any issues with that left hip or his right hip he knows to reach out and let us  know.  He says right now he is not taking any counter medication for pain for the right hip and if it worsens he will reach out to us .
